# Patient Record
Sex: Female | Born: 1980 | Race: White | Hispanic: Yes | Smoking: Never smoker
Health system: Southern US, Community
[De-identification: ages and names within clinical notes are randomized; demographics above are authoritative.]

## PROBLEM LIST (undated history)

## (undated) ENCOUNTER — Inpatient Hospital Stay (HOSPITAL_COMMUNITY): Payer: Self-pay

## (undated) DIAGNOSIS — Z3490 Encounter for supervision of normal pregnancy, unspecified, unspecified trimester: Secondary | ICD-10-CM

## (undated) DIAGNOSIS — K819 Cholecystitis, unspecified: Secondary | ICD-10-CM

## (undated) DIAGNOSIS — E669 Obesity, unspecified: Secondary | ICD-10-CM

## (undated) DIAGNOSIS — A749 Chlamydial infection, unspecified: Secondary | ICD-10-CM

## (undated) DIAGNOSIS — Z332 Encounter for elective termination of pregnancy: Secondary | ICD-10-CM

## (undated) HISTORY — DX: Cholecystitis, unspecified: K81.9

## (undated) HISTORY — DX: Obesity, unspecified: E66.9

## (undated) HISTORY — DX: Chlamydial infection, unspecified: A74.9

## (undated) HISTORY — DX: Encounter for elective termination of pregnancy: Z33.2

## (undated) HISTORY — DX: Encounter for supervision of normal pregnancy, unspecified, unspecified trimester: Z34.90

---
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
## 2002-02-13 DIAGNOSIS — A749 Chlamydial infection, unspecified: Secondary | ICD-10-CM

## 2002-02-13 HISTORY — DX: Chlamydial infection, unspecified: A74.9

## 2002-03-07 ENCOUNTER — Inpatient Hospital Stay (HOSPITAL_COMMUNITY): Admission: AD | Admit: 2002-03-07 | Discharge: 2002-03-07 | Payer: Self-pay | Admitting: *Deleted

## 2002-03-07 ENCOUNTER — Encounter: Payer: Self-pay | Admitting: *Deleted

## 2002-03-23 ENCOUNTER — Inpatient Hospital Stay (HOSPITAL_COMMUNITY): Admission: AD | Admit: 2002-03-23 | Discharge: 2002-03-25 | Payer: Self-pay | Admitting: *Deleted

## 2002-09-11 ENCOUNTER — Encounter: Admission: RE | Admit: 2002-09-11 | Discharge: 2002-09-11 | Payer: Self-pay | Admitting: Obstetrics and Gynecology

## 2002-09-25 ENCOUNTER — Other Ambulatory Visit: Admission: RE | Admit: 2002-09-25 | Discharge: 2002-09-25 | Payer: Self-pay | Admitting: Obstetrics and Gynecology

## 2002-09-25 ENCOUNTER — Encounter (INDEPENDENT_AMBULATORY_CARE_PROVIDER_SITE_OTHER): Payer: Self-pay | Admitting: *Deleted

## 2002-09-25 ENCOUNTER — Encounter: Admission: RE | Admit: 2002-09-25 | Discharge: 2002-09-25 | Payer: Self-pay | Admitting: Obstetrics and Gynecology

## 2002-10-16 ENCOUNTER — Encounter: Admission: RE | Admit: 2002-10-16 | Discharge: 2002-10-16 | Payer: Self-pay | Admitting: Obstetrics and Gynecology

## 2005-07-18 ENCOUNTER — Ambulatory Visit (HOSPITAL_COMMUNITY): Admission: RE | Admit: 2005-07-18 | Discharge: 2005-07-18 | Payer: Self-pay | Admitting: *Deleted

## 2005-10-03 ENCOUNTER — Ambulatory Visit (HOSPITAL_COMMUNITY): Admission: RE | Admit: 2005-10-03 | Discharge: 2005-10-03 | Payer: Self-pay | Admitting: Obstetrics & Gynecology

## 2005-11-04 ENCOUNTER — Inpatient Hospital Stay (HOSPITAL_COMMUNITY): Admission: AD | Admit: 2005-11-04 | Discharge: 2005-11-04 | Payer: Self-pay | Admitting: Obstetrics & Gynecology

## 2005-11-06 ENCOUNTER — Ambulatory Visit: Payer: Self-pay | Admitting: Obstetrics & Gynecology

## 2005-11-06 ENCOUNTER — Inpatient Hospital Stay (HOSPITAL_COMMUNITY): Admission: AD | Admit: 2005-11-06 | Discharge: 2005-11-06 | Payer: Self-pay | Admitting: Gynecology

## 2006-02-13 DIAGNOSIS — K819 Cholecystitis, unspecified: Secondary | ICD-10-CM

## 2006-02-13 DIAGNOSIS — Z332 Encounter for elective termination of pregnancy: Secondary | ICD-10-CM

## 2006-02-13 HISTORY — DX: Encounter for elective termination of pregnancy: Z33.2

## 2006-02-13 HISTORY — DX: Cholecystitis, unspecified: K81.9

## 2006-02-13 HISTORY — PX: CHOLECYSTECTOMY: SHX55

## 2006-03-01 ENCOUNTER — Ambulatory Visit: Payer: Self-pay | Admitting: *Deleted

## 2006-03-01 ENCOUNTER — Inpatient Hospital Stay (HOSPITAL_COMMUNITY): Admission: AD | Admit: 2006-03-01 | Discharge: 2006-03-01 | Payer: Self-pay | Admitting: Obstetrics & Gynecology

## 2006-03-06 ENCOUNTER — Ambulatory Visit: Payer: Self-pay | Admitting: *Deleted

## 2006-03-06 ENCOUNTER — Inpatient Hospital Stay (HOSPITAL_COMMUNITY): Admission: AD | Admit: 2006-03-06 | Discharge: 2006-03-07 | Payer: Self-pay | Admitting: Family Medicine

## 2006-03-07 ENCOUNTER — Ambulatory Visit: Payer: Self-pay | Admitting: *Deleted

## 2006-03-07 ENCOUNTER — Inpatient Hospital Stay (HOSPITAL_COMMUNITY): Admission: AD | Admit: 2006-03-07 | Discharge: 2006-03-09 | Payer: Self-pay | Admitting: Gynecology

## 2006-04-05 ENCOUNTER — Emergency Department (HOSPITAL_COMMUNITY): Admission: EM | Admit: 2006-04-05 | Discharge: 2006-04-06 | Payer: Self-pay | Admitting: Emergency Medicine

## 2006-04-08 ENCOUNTER — Emergency Department (HOSPITAL_COMMUNITY): Admission: EM | Admit: 2006-04-08 | Discharge: 2006-04-09 | Payer: Self-pay | Admitting: Emergency Medicine

## 2006-04-17 ENCOUNTER — Ambulatory Visit (HOSPITAL_COMMUNITY): Admission: RE | Admit: 2006-04-17 | Discharge: 2006-04-17 | Payer: Self-pay | Admitting: Surgery

## 2006-04-17 ENCOUNTER — Encounter (INDEPENDENT_AMBULATORY_CARE_PROVIDER_SITE_OTHER): Payer: Self-pay | Admitting: *Deleted

## 2007-06-10 ENCOUNTER — Emergency Department (HOSPITAL_COMMUNITY): Admission: EM | Admit: 2007-06-10 | Discharge: 2007-06-10 | Payer: Self-pay | Admitting: Emergency Medicine

## 2007-06-15 ENCOUNTER — Emergency Department (HOSPITAL_COMMUNITY): Admission: EM | Admit: 2007-06-15 | Discharge: 2007-06-15 | Payer: Self-pay | Admitting: Emergency Medicine

## 2007-09-20 ENCOUNTER — Ambulatory Visit: Payer: Self-pay | Admitting: Family Medicine

## 2007-10-02 ENCOUNTER — Ambulatory Visit: Payer: Self-pay | Admitting: Family Medicine

## 2007-10-02 LAB — CONVERTED CEMR LAB
ALT: 24 units/L (ref 0–35)
AST: 19 units/L (ref 0–37)
Albumin: 4.8 g/dL (ref 3.5–5.2)
BUN: 10 mg/dL (ref 6–23)
Basophils Absolute: 0 10*3/uL (ref 0.0–0.1)
Basophils Relative: 0 % (ref 0–1)
CO2: 22 meq/L (ref 19–32)
Calcium: 9.4 mg/dL (ref 8.4–10.5)
Chloride: 106 meq/L (ref 96–112)
Creatinine, Ser: 0.64 mg/dL (ref 0.40–1.20)
Eosinophils Absolute: 0.3 10*3/uL (ref 0.0–0.7)
Eosinophils Relative: 3 % (ref 0–5)
Glucose, Bld: 86 mg/dL (ref 70–99)
Hemoglobin: 13.9 g/dL (ref 12.0–15.0)
LDL Cholesterol: 77 mg/dL (ref 0–99)
Monocytes Absolute: 0.6 10*3/uL (ref 0.1–1.0)
Neutrophils Relative %: 65 % (ref 43–77)
RDW: 13.8 % (ref 11.5–15.5)
Sodium: 139 meq/L (ref 135–145)
Triglycerides: 92 mg/dL (ref <150)
WBC: 8.8 10*3/uL (ref 4.0–10.5)

## 2007-11-27 ENCOUNTER — Ambulatory Visit: Payer: Self-pay | Admitting: Internal Medicine

## 2007-11-27 ENCOUNTER — Encounter: Payer: Self-pay | Admitting: Family Medicine

## 2007-11-27 ENCOUNTER — Encounter: Payer: Self-pay | Admitting: Internal Medicine

## 2007-11-27 LAB — CONVERTED CEMR LAB: Chlamydia, DNA Probe: NEGATIVE

## 2007-11-28 ENCOUNTER — Emergency Department (HOSPITAL_COMMUNITY): Admission: EM | Admit: 2007-11-28 | Discharge: 2007-11-28 | Payer: Self-pay | Admitting: Emergency Medicine

## 2008-03-03 IMAGING — US US OB COMP LESS 14 WK
1 series · 14 of 24 positions shown · non-contrast
Comparison: none

CLINICAL DATA: Positive pregnancy test.
 OBSTETRICAL ULTRASOUND <14 WKS AND TRANSVAGINAL OB US:
TECHNIQUE: Both transabdominal and transvaginal ultrasound examinations were performed for complete evaluation of the gestation as well as the maternal uterus, adnexal regions, and pelvic cul-de-sac.

[Series 1: us ob comp less 14 wk · 0.35mm/px · 14 of 24 slices shown]
[im 1/24]
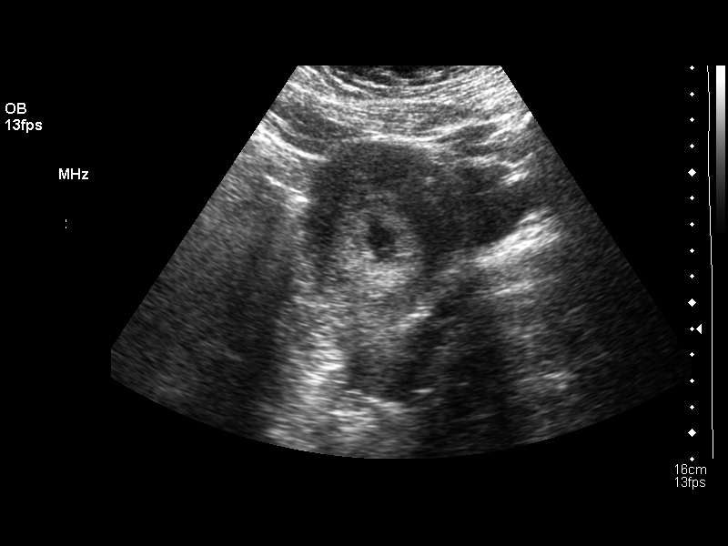
[im 3/24]
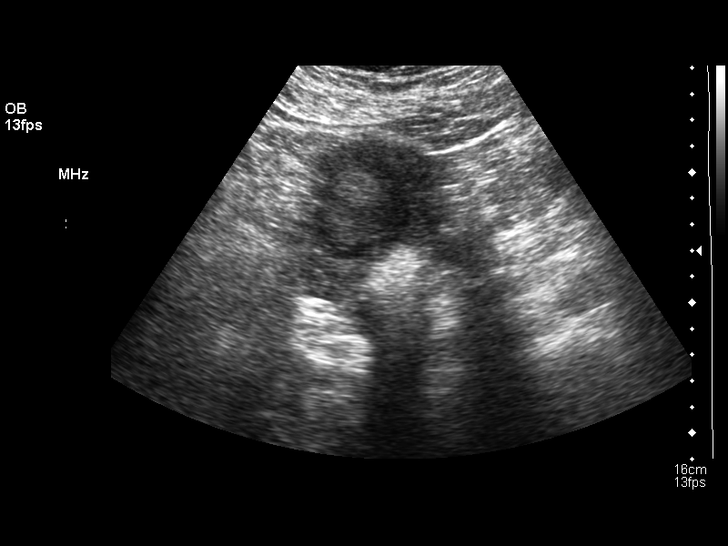
[im 5/24]
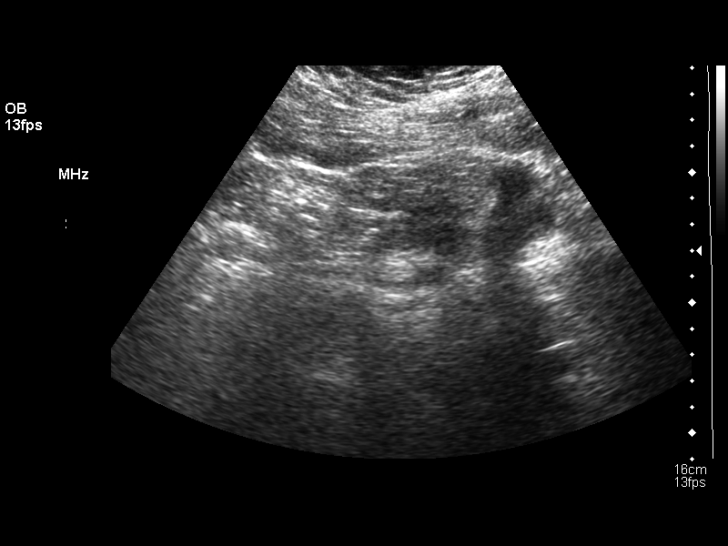
[im 7/24]
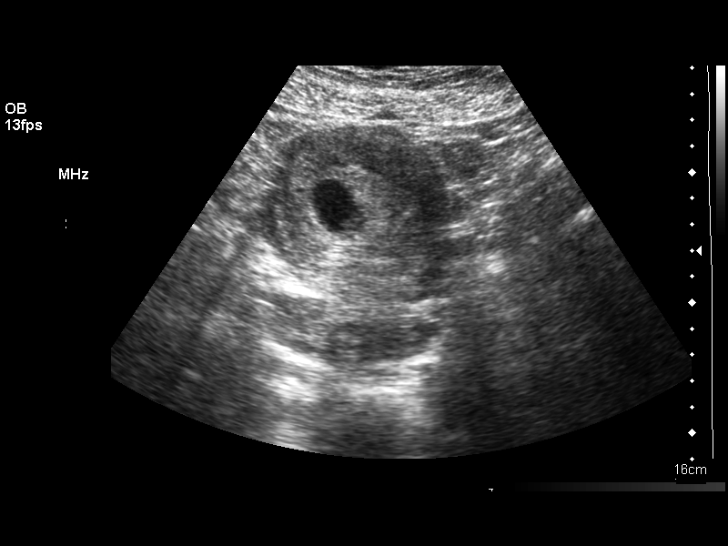
[im 8/24]
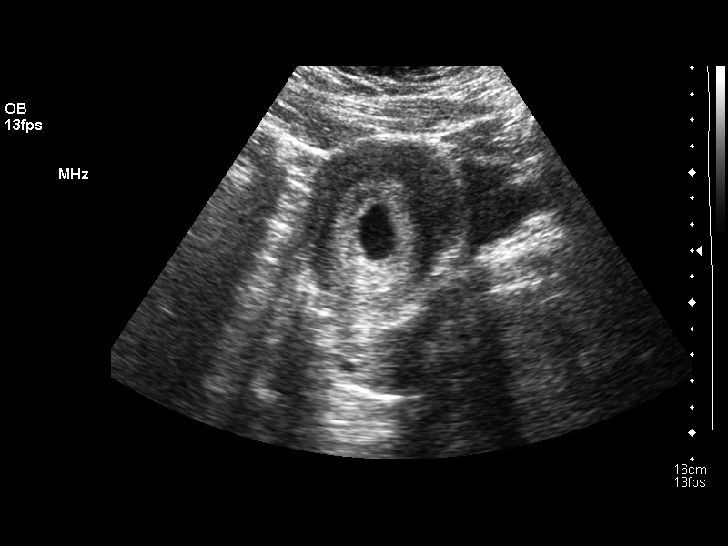
[im 10/24]
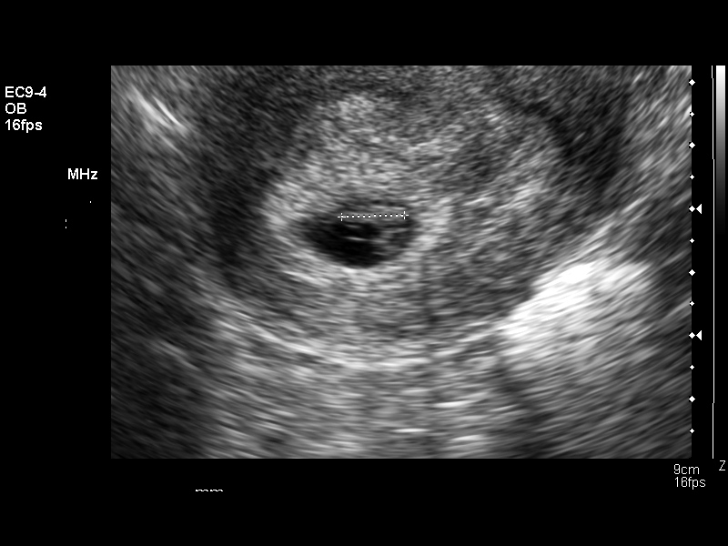
[im 12/24]
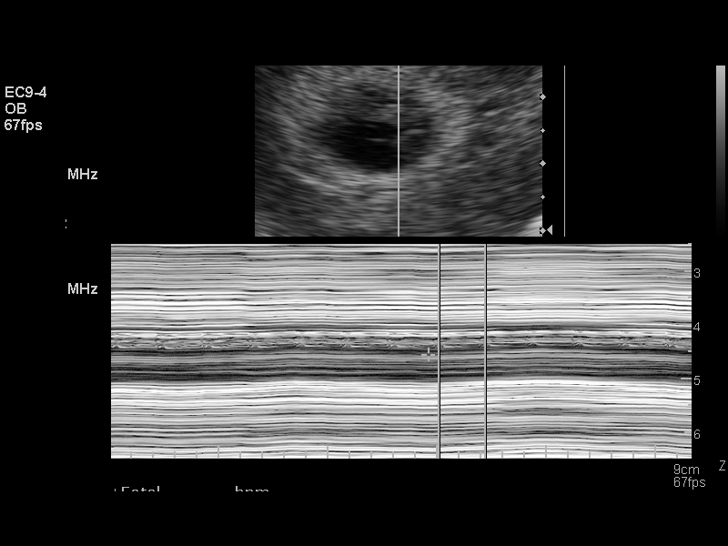
[im 13/24]
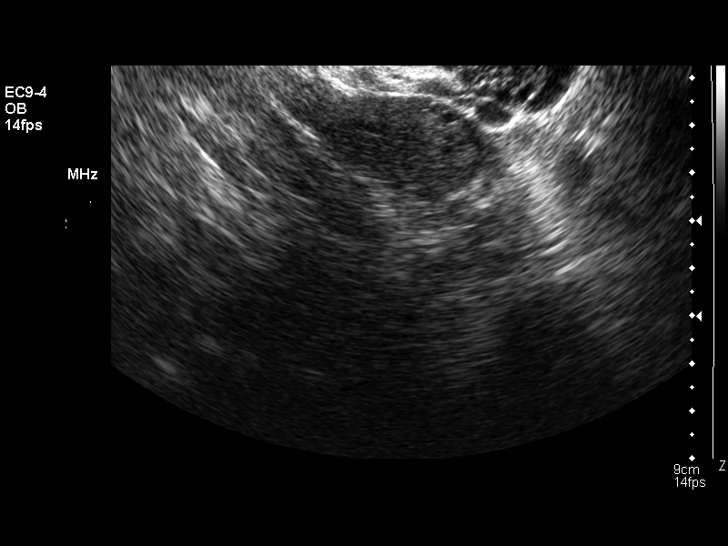
[im 15/24]
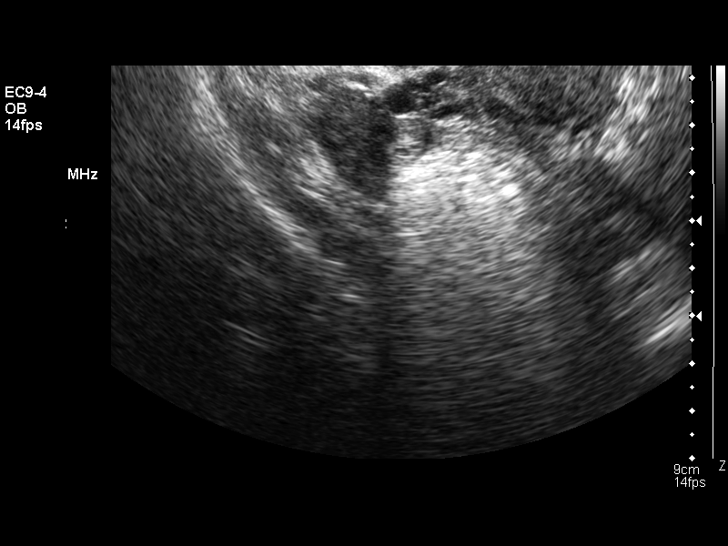
[im 17/24]
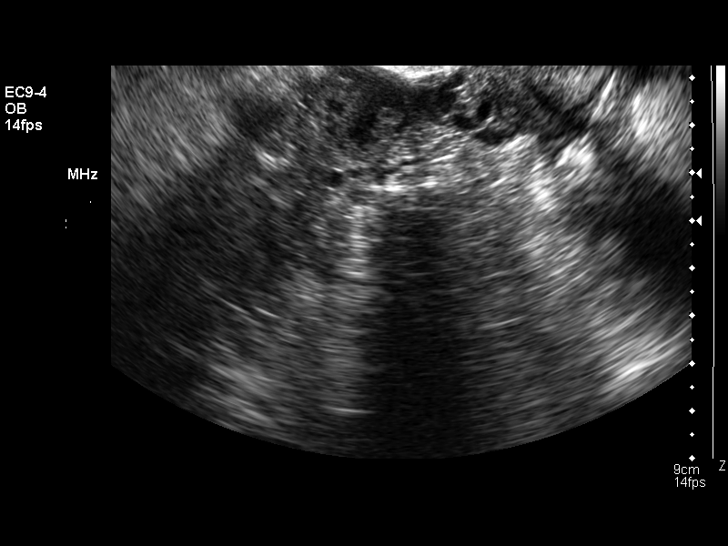
[im 19/24]
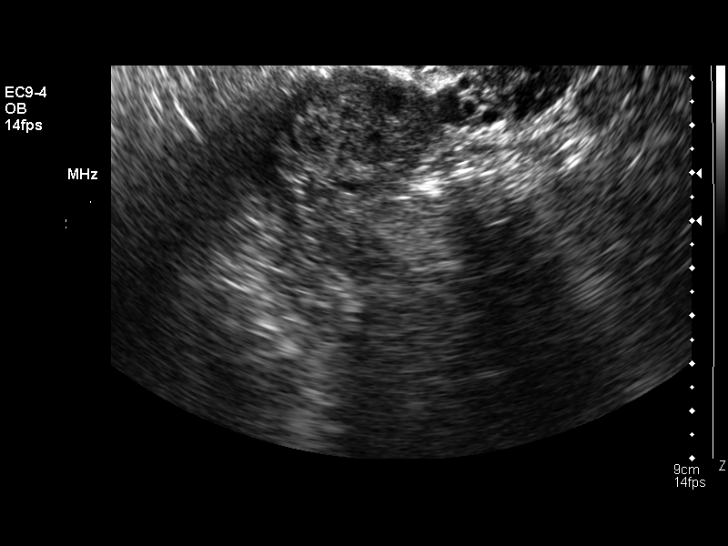
[im 20/24]
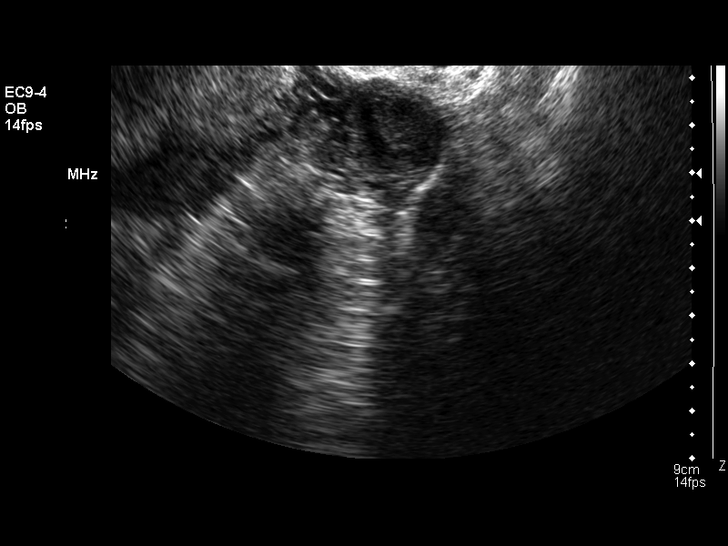
[im 22/24]
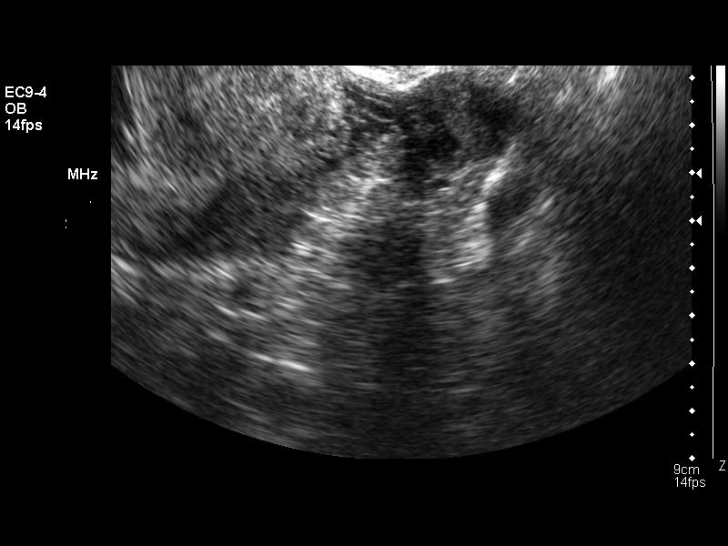
[im 24/24]
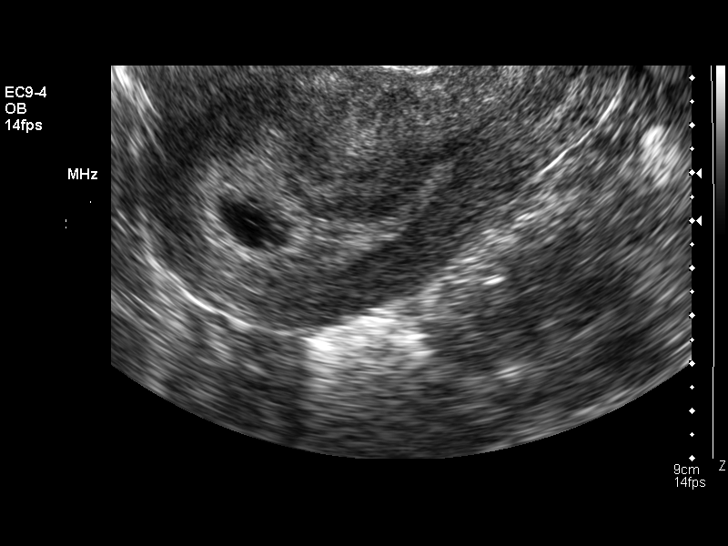

[14 of 24 positions shown; findings below may reference images not displayed]

FINDINGS: An intrauterine gestational sac is identified containing both a yolk sac and embryo.  There is discernible cardiac activity within the embryo at 141 bpm.  The crown rump length is 10 mm which estimates a 7 week 0 day gestational age and an ultrasound EDC of 03/06/06.
 Ovaries are unremarkable.  No free fluid is apparent in the cul-de-sac.
IMPRESSION: Single living intrauterine gestation at 7 week 0 day gestational age.

## 2009-03-30 ENCOUNTER — Emergency Department (HOSPITAL_COMMUNITY): Admission: EM | Admit: 2009-03-30 | Discharge: 2009-03-30 | Payer: Self-pay | Admitting: Emergency Medicine

## 2009-04-11 ENCOUNTER — Emergency Department (HOSPITAL_COMMUNITY): Admission: EM | Admit: 2009-04-11 | Discharge: 2009-04-11 | Payer: Self-pay | Admitting: Family Medicine

## 2009-10-03 ENCOUNTER — Emergency Department (HOSPITAL_COMMUNITY): Admission: EM | Admit: 2009-10-03 | Discharge: 2009-10-03 | Payer: Self-pay | Admitting: Emergency Medicine

## 2010-05-04 LAB — URINALYSIS, ROUTINE W REFLEX MICROSCOPIC
Bilirubin Urine: NEGATIVE
Hgb urine dipstick: NEGATIVE
pH: 5.5 (ref 5.0–8.0)

## 2010-05-04 LAB — POCT URINALYSIS DIP (DEVICE)
Bilirubin Urine: NEGATIVE
Glucose, UA: NEGATIVE mg/dL
Nitrite: NEGATIVE
Protein, ur: NEGATIVE mg/dL
Specific Gravity, Urine: 1.015 (ref 1.005–1.030)
Urobilinogen, UA: 0.2 mg/dL (ref 0.0–1.0)

## 2010-05-04 LAB — RAPID STREP SCREEN (MED CTR MEBANE ONLY): Streptococcus, Group A Screen (Direct): NEGATIVE

## 2010-07-01 NOTE — Op Note (Signed)
Bonnie, Mcneil              ACCOUNT NO.:  [id number]   MEDICAL RECORD NO.:  [id number]          PATIENT TYPE:  AMB   LOCATION:  SDS                          FACILITY:  [facility]   PHYSICIAN:  Ardeth Sportsman, MD     DATE OF BIRTH:  06/19/1980   DATE OF PROCEDURE:  04/17/2006  DATE OF DISCHARGE:                               OPERATIVE REPORT   SURGEON:  Ardeth Sportsman, MD.   ASSISTANTS:  None.   PREOPERATIVE DIAGNOSIS:  Symptomatic cholecystolithiasis.   POSTOPERATIVE DIAGNOSES:  1. Symptomatic cholecystolithiasis.  2. Probable chronic cholecystitis.   PROCEDURE PERFORMED:  Laparoscopic cholecystectomy with intraoperative  cholangiogram.   ANESTHESIA:  1. General anesthesia.  2. Local anesthetic in a field block around all port sites.   SPECIMENS:  Gallbladder.   DRAINS:  None.   ESTIMATED BLOOD LOSS:  Less than 5 mL.   COMPLICATIONS:  None apparent.   INDICATIONS:  Miss Rampersaud is a 30 year old female, who has a classic  story for biliary colic and known gallstones.  The anatomy and  physiology of hepatobiliary and pancreatic function were discussed.  The  pathophysiology of cholecystolithiasis with risks of gallstone  pancreatitis, choledocholithiasis, acute cholecystitis, etc., were  discussed.  Options were discussed and recommendation was made for a  laparoscopic cholecystectomy with intraoperative cholangiogram.   The risks, such as stroke, heart attack, deep vein thrombosis, pulmonary  embolism and death were discussed.  Risks such as bleeding, need for  transfusion, wound infection, abscess, injury to other organs, bile  ducts with need of operative reconstruction or endoscopic stenting or  drainage were discussed.  Risks of incisional hernia, prolonged pain and  other risks were discussed.  Questions were answered and she agreed to  proceed.   OPERATIVE FINDINGS:  She had some moderate omental adhesions to her  gallbladder, and some mild gallbladder wall  thickening consistent with  probable chronic cholecystitis.  Her cholangiogram was normal.   DESCRIPTION OF PROCEDURE:  Informed consent was confirmed.  The patient  voided just prior to going to the operating room.  She had sequential  compression devices active during the entire case.  She underwent  general anesthesia without difficulty.  She was positioned supine with  both arms tucked.  Her abdomen was prepped and draped in sterile  fashion.   Entry was initially attempted through optical entry through a right  upper quadrant stab incision using optical entry with the patient in  reverse Trendelenburg and right side up.  However, the port would not  advance properly, and I could not get it completely through the  abdominal wall without having a fair amount of pressure on the abdominal  wall. Therefore, I stopped this and went ahead and got entry through a  Veress technique and through an infraumbilical vertical incision.  A  towel clamp was used to help grab the umbilical stalk for fascial  countertraction, and the Veress needle passed easily into the abdomen.  Capnoperitoneum to 15 mmHg provided good abdominal insufflation.  A 5-mm  port was placed.  Under direct visualization, a 5-mm port was placed  in  the right upper quadrant, as well as in the right flank.  A 10-mm port  was placed in the subxiphoid region.  Camera inspection revealed that  there was a little small ecchymosis on the greater omentum just cephalad  to the transverse colon.  Careful inspection noted no injury to the  transverse colon or any other organs.  There was no bowel injury or  bleeding.   The gallbladder fundus was grasped and elevated cephalad.  Omental  attachments were freed off carefully.  There were some  cholecystoduodenal attachments that were gently freed off as well.  The  peritoneal covering between the gallbladder and the liver were freed off  on the anteromedial and positional aspects.   Circumferential dissection  was done to reveal a good critical view.  There was a small pulsatile  structure going from the gallbladder down into the porta hepatis  consistent with the cystic artery.  One clip on the gallbladder side and  2 clips on the proximal were made and this was transected.  This would  leave 1 last structure going from the gallbladder infundibulum down to  the porta hepatis consistent with the cystic duct.  A clip was made on  the gallbladder infundibulum.  The cystic duct was transected partially  as close to the gallbladder as possible.   A 5-French cholangiogram catheter was placed through a stab incision in  the right subcostal region, flushed and passed into the cystic duct.  A  gentle clip was placed around the opening to help avoid a leak.  A  cholangiogram was run using active fluoroscopy, using diluted radiopaque  contrast.  Contrast flowed well off a side helical branch consistent  with the cystic duct cannulation.  Contrast flowed well into the common  bile duct, up the common hepatic duct, and in right and left  intrahepatic chains.  There was no evidence of any leak.  Contrast  flowed easily down the distal common bile duct into the duodenum.  This  was consistent with a normal cholangiogram.  The clip was removed and  the catheter was removed.   Four clips were made slightly proximal to the partial cystic ductotomy  and the cystic duct transection was completed.  The gallbladder was  freed from its remaining attachments on the liver bed and removed at the  subxiphoid port with minimal dilation.  The fascial defect in the  subxiphoid region was too small to allow my pinky to pass, and it was  also buried within the falciform ligament; so, I felt it did not require  any more aggressive closure.   Copious irrigation was performed with nice clear return.  There was excellent hemostasis.  Clips were intact on the cystic arterial and duct  stumps with  no evidence of any leak of blood or bile.  The upper 3  abdominal ports were removed and inspection revealed no bleeding in the  peritoneum.  Capnoperitoneum was evacuated.  The umbilical  port was removed.  The skin was closed using a 4-0 Monocryl stitch.  Sterile dressings were applied.  The patient was extubated and sent to  the recovery room in stable condition.   I am about to explain the operative findings to the patient's family.      Ardeth Sportsman, MD  Electronically Signed     SCG/MEDQ  D:  04/17/2006  T:  04/17/2006  Job:  [id number]   cc:   Osvaldo Human, M.D.

## 2011-06-04 ENCOUNTER — Ambulatory Visit: Payer: Self-pay | Admitting: Family Medicine

## 2011-06-04 VITALS — BP 109/71 | HR 70 | Temp 98.5°F | Resp 16 | Ht 66.5 in | Wt 189.0 lb

## 2011-06-04 DIAGNOSIS — K5289 Other specified noninfective gastroenteritis and colitis: Secondary | ICD-10-CM

## 2011-06-04 DIAGNOSIS — Z3201 Encounter for pregnancy test, result positive: Secondary | ICD-10-CM

## 2011-06-04 DIAGNOSIS — N912 Amenorrhea, unspecified: Secondary | ICD-10-CM

## 2011-06-04 DIAGNOSIS — K529 Noninfective gastroenteritis and colitis, unspecified: Secondary | ICD-10-CM

## 2011-06-04 LAB — POCT URINE PREGNANCY: Preg Test, Ur: POSITIVE

## 2011-06-04 MED ORDER — ONDANSETRON 4 MG PO TBDP: 4.0000 mg | ORAL_TABLET | Freq: Three times a day (TID) | ORAL | Status: DC | PRN

## 2011-06-04 NOTE — Progress Notes (Signed)
Subjective: 31 year old Hispanic female who is here with a history of having had diarrhea for about 3 days. He calmed down initially, then got worse again yesterday. Today she's had vomiting and diarrhea. She has vomited 3 times this morning. She has kept some liquids down, enough that she is still urinating. Her last menstrual period in his about 33 days ago, making her just a little overdue.  Nobody else at home is ill. 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  Objective: Doesn't Hispanic female no acute distress. Abdomen had normal bowel sounds. Nontender to percussion or palpitation. No CVA tenderness.  Assessment: Gastroenteritis Amenorrhea (late period)  Plan: Check urine hCG Zofran ODT prescription  Results for orders placed in visit on 06/04/11  POCT URINE PREGNANCY      Component Value Range   Preg Test, Ur Positive      Told her she was pregnant. Advised aching a prenatal vitamin.

## 2011-06-04 NOTE — Patient Instructions (Addendum)
Viral Gastroenteritis Viral gastroenteritis is also known as stomach flu. This condition affects the stomach and intestinal tract. It can cause sudden diarrhea and vomiting. The illness typically lasts 3 to 8 days. Most people develop an immune response that eventually gets rid of the virus. While this natural response develops, the virus can make you quite ill. CAUSES  Many different viruses can cause gastroenteritis, such as rotavirus or noroviruses. You can catch one of these viruses by consuming contaminated food or water. You may also catch a virus by sharing utensils or other personal items with an infected person or by touching a contaminated surface. SYMPTOMS  The most common symptoms are diarrhea and vomiting. These problems can cause a severe loss of body fluids (dehydration) and a body salt (electrolyte) imbalance. Other symptoms may include:  Fever.   Headache.   Fatigue.   Abdominal pain.  DIAGNOSIS  Your caregiver can usually diagnose viral gastroenteritis based on your symptoms and a physical exam. A stool sample may also be taken to test for the presence of viruses or other infections. TREATMENT  This illness typically goes away on its own. Treatments are aimed at rehydration. The most serious cases of viral gastroenteritis involve vomiting so severely that you are not able to keep fluids down. In these cases, fluids must be given through an intravenous line (IV). HOME CARE INSTRUCTIONS   Drink enough fluids to keep your urine clear or pale yellow. Drink small amounts of fluids frequently and increase the amounts as tolerated.   Ask your caregiver for specific rehydration instructions.   Avoid:   Foods high in sugar.   Alcohol.   Carbonated drinks.   Tobacco.   Juice.   Caffeine drinks.   Extremely hot or cold fluids.   Fatty, greasy foods.   Too much intake of anything at one time.   Dairy products until 24 to 48 hours after diarrhea stops.   You may  consume probiotics. Probiotics are active cultures of beneficial bacteria. They may lessen the amount and number of diarrheal stools in adults. Probiotics can be found in yogurt with active cultures and in supplements.   Wash your hands well to avoid spreading the virus.   Only take over-the-counter or prescription medicines for pain, discomfort, or fever as directed by your caregiver. Do not give aspirin to children. Antidiarrheal medicines are not recommended.   Ask your caregiver if you should continue to take your regular prescribed and over-the-counter medicines.   Keep all follow-up appointments as directed by your caregiver.  SEEK IMMEDIATE MEDICAL CARE IF:   You are unable to keep fluids down.   You do not urinate at least once every 6 to 8 hours.   You develop shortness of breath.   You notice blood in your stool or vomit. This may look like coffee grounds.   You have abdominal pain that increases or is concentrated in one small area (localized).   You have persistent vomiting or diarrhea.   You have a fever.   The patient is a child younger than 3 months, and he or she has a fever.   The patient is a child older than 3 months, and he or she has a fever and persistent symptoms.   The patient is a child older than 3 months, and he or she has a fever and symptoms suddenly get worse.   The patient is a baby, and he or she has no tears when crying.  MAKE SURE YOU:     Understand these instructions.   Will watch your condition.   Will get help right away if you are not doing well or get worse.  Document Released: 01/30/2005 Document Revised: 01/19/2011 Document Reviewed: 11/16/2010 [facility] Patient Information 2012 [place], [place].  Consider visiting the [facility] on [address]  Prenatal vitamin

## 2011-06-08 ENCOUNTER — Emergency Department (HOSPITAL_COMMUNITY): Payer: Self-pay

## 2011-06-08 ENCOUNTER — Emergency Department (HOSPITAL_COMMUNITY)
Admission: EM | Admit: 2011-06-08 | Discharge: 2011-06-09 | Disposition: A | Discharge status: Home / Self Care | Payer: Self-pay | Attending: Emergency Medicine | Admitting: Emergency Medicine

## 2011-06-08 ENCOUNTER — Encounter (HOSPITAL_COMMUNITY): Payer: Self-pay | Admitting: Emergency Medicine

## 2011-06-08 DIAGNOSIS — O26899 Other specified pregnancy related conditions, unspecified trimester: Secondary | ICD-10-CM

## 2011-06-08 DIAGNOSIS — O2690 Pregnancy related conditions, unspecified, unspecified trimester: Secondary | ICD-10-CM | POA: Insufficient documentation

## 2011-06-08 DIAGNOSIS — R109 Unspecified abdominal pain: Secondary | ICD-10-CM | POA: Insufficient documentation

## 2011-06-08 DIAGNOSIS — N9489 Other specified conditions associated with female genital organs and menstrual cycle: Secondary | ICD-10-CM | POA: Insufficient documentation

## 2011-06-08 LAB — DIFFERENTIAL
Basophils Absolute: 0 10*3/uL (ref 0.0–0.1)
Eosinophils Absolute: 0.4 10*3/uL (ref 0.0–0.7)
Monocytes Relative: 7 % (ref 3–12)
Neutro Abs: 8.9 10*3/uL — ABNORMAL HIGH (ref 1.7–7.7)
Neutrophils Relative %: 70 % (ref 43–77)

## 2011-06-08 LAB — POCT I-STAT, CHEM 8
BUN: 13 mg/dL (ref 6–23)
Calcium, Ion: 1.18 mmol/L (ref 1.12–1.32)
Chloride: 105 mEq/L (ref 96–112)
Creatinine, Ser: 0.5 mg/dL (ref 0.50–1.10)
HCT: 39 % (ref 36.0–46.0)
Potassium: 3.4 mEq/L — ABNORMAL LOW (ref 3.5–5.1)
Sodium: 140 mEq/L (ref 135–145)
TCO2: 24 mmol/L (ref 0–100)

## 2011-06-08 LAB — URINALYSIS, ROUTINE W REFLEX MICROSCOPIC
Bilirubin Urine: NEGATIVE
Hgb urine dipstick: NEGATIVE
Ketones, ur: NEGATIVE mg/dL
Leukocytes, UA: NEGATIVE
Nitrite: NEGATIVE
Specific Gravity, Urine: 1.012 (ref 1.005–1.030)
Urobilinogen, UA: 1 mg/dL (ref 0.0–1.0)

## 2011-06-08 LAB — WET PREP, GENITAL: Clue Cells Wet Prep HPF POC: NONE SEEN

## 2011-06-08 LAB — CBC
Hemoglobin: 13.3 g/dL (ref 12.0–15.0)
MCH: 29.9 pg (ref 26.0–34.0)

## 2011-06-08 NOTE — ED Notes (Signed)
Pt changing into gown

## 2011-06-08 NOTE — ED Notes (Signed)
Pt aware of long wait for [place].  Pt verbalized understanding.

## 2011-06-08 NOTE — ED Provider Notes (Signed)
History     CSN: [id number]  Arrival date & time 06/08/11  1847   First MD Initiated Contact with Patient 06/08/11 1939      Chief Complaint  Patient presents with  . Pelvic Pain    (Consider location/radiation/quality/duration/timing/severity/associated sxs/prior treatment) HPI Comments: Patient here with lower abdominal cramping - she states that she just found out last week that she was pregnant - estimated to be about 6-7 weeks per LMP at her PCP's office - she states that starting today she began with crampy abdominal pain.  She denies vaginal bleeding or discharge - states that with her two prior pregnancies she never had any pain so she became concerned.  She reports that the pain is episodic and feels like menstrual cramps.  She denies nausea, vomiting, fever, chills, dysuria, hematuria, constipation or diarrhea.  Patient is a 31 y.o. female presenting with pelvic pain. The history is provided by the patient. No language interpreter was used.  Pelvic Pain This is a new problem. The current episode started today. The problem occurs intermittently. The problem has been unchanged. Associated symptoms include abdominal pain. Pertinent negatives include no anorexia, arthralgias, change in bowel habit, chest pain, chills, congestion, coughing, diaphoresis, fatigue, fever, headaches, joint swelling, myalgias, nausea, neck pain, numbness, rash, sore throat, swollen glands, urinary symptoms, vertigo, visual change, vomiting or weakness. The symptoms are aggravated by nothing. She has tried nothing for the symptoms. The treatment provided no relief.    No past medical history on file.  Past Surgical History  Procedure Date  . Cholecystectomy     No family history on file.  History  Substance Use Topics  . Smoking status: Never Smoker   . Smokeless tobacco: Not on file  . Alcohol Use: Not on file    OB History    Grav Para Term Preterm Abortions TAB SAB Ect Mult Living   1                Review of Systems  Constitutional: Negative for fever, chills, diaphoresis and fatigue.  HENT: Negative for congestion, sore throat and neck pain.   Respiratory: Negative for cough.   Cardiovascular: Negative for chest pain.  Gastrointestinal: Positive for abdominal pain. Negative for nausea, vomiting, anorexia and change in bowel habit.  Genitourinary: Positive for pelvic pain. Negative for dysuria, vaginal bleeding and vaginal discharge.  Musculoskeletal: Negative for myalgias, joint swelling and arthralgias.  Skin: Negative for rash.  Neurological: Negative for vertigo, weakness, numbness and headaches.  All other systems reviewed and are negative.    Allergies  Review of patient's allergies indicates no known allergies.  Home Medications   Current Outpatient Rx  Name Route Sig Dispense Refill  . PRENATAL PO Oral Take 1 tablet by mouth daily.      BP 103/49  Pulse 90  Temp(Src) 98 F (36.7 C) (Oral)  SpO2 97%  LMP 04/30/2011  Physical Exam  Nursing note and vitals reviewed. Constitutional: She appears well-developed and well-nourished. No distress.  HENT:  Head: Normocephalic and atraumatic.  Right Ear: External ear normal.  Left Ear: External ear normal.  Nose: Nose normal.  Mouth/Throat: Oropharynx is clear and moist. No oropharyngeal exudate.  Eyes: Conjunctivae are normal. Pupils are equal, round, and reactive to light. No scleral icterus.  Neck: Normal range of motion. Neck supple.  Cardiovascular: Normal rate, regular rhythm and normal heart sounds.  Exam reveals no gallop and no friction rub.   No murmur heard. Pulmonary/Chest: Effort normal and  breath sounds normal. No respiratory distress. She has no wheezes. She has no rales. She exhibits no tenderness.  Abdominal: Soft. Bowel sounds are normal. She exhibits no distension and no mass. There is tenderness. There is no rebound and no guarding.    Lymphadenopathy:    She has no cervical  adenopathy.    ED Course  Procedures (including critical care time)   Labs Reviewed  CBC  DIFFERENTIAL  HCG, QUANTITATIVE, PREGNANCY  GC/CHLAMYDIA PROBE AMP, GENITAL  WET PREP, GENITAL   No results found. Results for orders placed during the hospital encounter of 06/08/11  CBC      Component Value Range   WBC 12.8 (*) 4.0 - 10.5 (K/uL)   RBC 4.45  3.87 - 5.11 (MIL/uL)   Hemoglobin 13.3  12.0 - 15.0 (g/dL)   HCT 40.9  81.1 - 91.4 (%)   MCV 85.6  78.0 - 100.0 (fL)   MCH 29.9  26.0 - 34.0 (pg)   MCHC 34.9  30.0 - 36.0 (g/dL)   RDW 78.2  95.6 - 21.3 (%)   Platelets 284  150 - 400 (K/uL)  DIFFERENTIAL      Component Value Range   Neutrophils Relative 70  43 - 77 (%)   Neutro Abs 8.9 (*) 1.7 - 7.7 (K/uL)   Lymphocytes Relative 20  12 - 46 (%)   Lymphs Abs 2.6  0.7 - 4.0 (K/uL)   Monocytes Relative 7  3 - 12 (%)   Monocytes Absolute 0.9  0.1 - 1.0 (K/uL)   Eosinophils Relative 3  0 - 5 (%)   Eosinophils Absolute 0.4  0.0 - 0.7 (K/uL)   Basophils Relative 0  0 - 1 (%)   Basophils Absolute 0.0  0.0 - 0.1 (K/uL)  HCG, QUANTITATIVE, PREGNANCY      Component Value Range   hCG, Beta Chain, Quant, S 298 (*) <5 (mIU/mL)  WET PREP, GENITAL      Component Value Range   Yeast Wet Prep HPF POC NONE SEEN  NONE SEEN    Trich, Wet Prep NONE SEEN  NONE SEEN    Clue Cells Wet Prep HPF POC NONE SEEN  NONE SEEN    WBC, Wet Prep HPF POC NONE SEEN  NONE SEEN   URINALYSIS, ROUTINE W REFLEX MICROSCOPIC      Component Value Range   Color, Urine YELLOW  YELLOW    APPearance CLEAR  CLEAR    Specific Gravity, Urine 1.012  1.005 - 1.030    pH 7.0  5.0 - 8.0    Glucose, UA NEGATIVE  NEGATIVE (mg/dL)   Hgb urine dipstick NEGATIVE  NEGATIVE    Bilirubin Urine NEGATIVE  NEGATIVE    Ketones, ur NEGATIVE  NEGATIVE (mg/dL)   Protein, ur NEGATIVE  NEGATIVE (mg/dL)   Urobilinogen, UA 1.0  0.0 - 1.0 (mg/dL)   Nitrite NEGATIVE  NEGATIVE    Leukocytes, UA NEGATIVE  NEGATIVE   POCT I-STAT, CHEM 8       Component Value Range   Sodium 140  135 - 145 (mEq/L)   Potassium 3.4 (*) 3.5 - 5.1 (mEq/L)   Chloride 105  96 - 112 (mEq/L)   BUN 13  6 - 23 (mg/dL)   Creatinine, Ser 0.86  0.50 - 1.10 (mg/dL)   Glucose, Bld 83  70 - 99 (mg/dL)   Calcium, Ion 5.78  4.69 - 1.32 (mmol/L)   TCO2 24  0 - 100 (mmol/L)   Hemoglobin 13.3  12.0 -  15.0 (g/dL)   HCT 16.1  09.6 - 04.5 (%)   US Ob Comp Less 14 Wks  06/09/2011  *RADIOLOGY REPORT*  Clinical Data: Pelvic pain, positive pregnancy test  OBSTETRIC <14 WK [place] AND TRANSVAGINAL OB US  Technique:  Both transabdominal and transvaginal ultrasound examinations were performed for complete evaluation of the gestation as well as the maternal uterus, adnexal regions, and pelvic cul-de-sac.  Transvaginal technique was performed to assess early pregnancy.  Comparison:  None.  Intrauterine gestational sac:  Not visualized Yolk sac: Not visualized Embryo: Not visualized Cardiac Activity: Not visualized  Maternal uterus/adnexae: 1.2 x 0.8 x 0.7 cm simple appearing right para ovarian cyst incidentally noted.  The ovaries are normal.  Small free fluid incidentally noted.  IMPRESSION: No intrauterine gestational sac, yolk sac, fetal pole, or cardiac activity visualized. Differential considerations include intrauterine gestation too early to be sonographically visualized, spontaneous abortion, or ectopic pregnancy.  Consider follow-up ultrasound in 14 days and serial quantitative beta HCG follow-up.  Original Report Authenticated By: Harrel Lemon, M.D.   US Ob Transvaginal  06/09/2011  *RADIOLOGY REPORT*  Clinical Data: Pelvic pain, positive pregnancy test  OBSTETRIC <14 WK [place] AND TRANSVAGINAL OB US  Technique:  Both transabdominal and transvaginal ultrasound examinations were performed for complete evaluation of the gestation as well as the maternal uterus, adnexal regions, and pelvic cul-de-sac.  Transvaginal technique was performed to assess early pregnancy.  Comparison:  None.   Intrauterine gestational sac:  Not visualized Yolk sac: Not visualized Embryo: Not visualized Cardiac Activity: Not visualized  Maternal uterus/adnexae: 1.2 x 0.8 x 0.7 cm simple appearing right para ovarian cyst incidentally noted.  The ovaries are normal.  Small free fluid incidentally noted.  IMPRESSION: No intrauterine gestational sac, yolk sac, fetal pole, or cardiac activity visualized. Differential considerations include intrauterine gestation too early to be sonographically visualized, spontaneous abortion, or ectopic pregnancy.  Consider follow-up ultrasound in 14 days and serial quantitative beta HCG follow-up.  Original Report Authenticated By: Harrel Lemon, M.D.      Abdominal pain in early pregnancy    MDM  Patient here with crampy abdominal pain in early pregnancy - she likely has a pregnancy too early to be seen on ultrasound, concerns would continue to be ectopic pregnancy so I have asked the patient to follow up with her OB ASAP which she agrees to do.  She also knows to return here with any worsening of pain, vaginal bleeding, nausea or vomiiting.       Izola Price [place], [place] 06/09/11 [phone]

## 2011-06-08 NOTE — ED Notes (Signed)
Pt has mild pain in lower abd since this morning.  Pt aware of waiting on CT.

## 2011-06-08 NOTE — ED Notes (Signed)
Pt reports she is 5 to [redacted] weeks pregnant confirmed by PCP; has not seen OBGYN yet. Pt now reports 0700 pain in pelvic region that comes and goes. She reports it feels like cramping. No bleeding or spotting noted.

## 2011-06-08 NOTE — ED Notes (Signed)
Pt sitting in stretcher in NAD, respirations even and unlabored. 

## 2011-06-08 NOTE — ED Notes (Signed)
NP to bedside

## 2011-06-09 LAB — GC/CHLAMYDIA PROBE AMP, GENITAL
Chlamydia, DNA Probe: NEGATIVE
GC Probe Amp, Genital: NEGATIVE

## 2011-06-09 NOTE — ED Provider Notes (Signed)
Medical screening examination/treatment/procedure(s) were performed by non-physician practitioner and as supervising physician I was immediately available for consultation/collaboration.   Glynn Octave, MD 06/09/11 1022

## 2011-06-09 NOTE — Discharge Instructions (Signed)
Dolor abdominal en el embarazo  (Abdominal Pain During Pregnancy) Las molestias abdominales son frecuentes Academic librarian. Generalmente no causan ningn dao. Puede tener numerosas causas. Algunas causas son ms graves que otras. Ciertas causas se diagnostican fcilmente. En algunos casos, se demora algn tiempo para comprender el diagnstico. Otras veces la causa no se conoce. El dolor abdominal puede ser un signo de que algo no anda bien en el [place] puede ser debido a una causa totalmente diferente. Por este motivo, siempre comente a su mdico cuando sienta molestias abdominales.  CAUSAS  Las causas ms frecuentes y que no causan ningn dao son:   Constipacin.   Exceso de gases y meteorismo.   Dolor en el ligamento redondo. Este dolor se siente en los pliegues de la ingle.   La posicin en que se encuentra el beb o la placenta.   Las pataditas del beb.   Contracciones de Braxton-Hicks. Estas son contracciones suaves que no producen dilatacin del cuello.  Otras causas graves de dolor abdominal son:   Vanetta Mulders ectpico Se produce cuando un vulo fertilizado se implanta fuera del tero.   Aborto espontneo.   Parto prematuro. El parto prematuro comienza antes de la semana 37 de [place].   Desprendimiento de la placenta. Ocurre cuando la placenta se separa parcial o completamente del tero.   Preeclampsia Generalmente se asocia a hipertensin arterial y tambin se denomina "toxemia del embarazo".   Infecciones del tero o del lquido amnitico.  Las causas que no se relacionan con Firefighter son:   Infeccin del tracto urinario.   Clculos o inflamacin de la vescula.   Hepatitis u otras enfermedades del hgado.   Trastornos intestinales, virus en el estmago, intoxicacin alimentaria, lcera.   Apendicitis.   Clculos en el rin (renales).   Infeccin renal (pielonefritis).  INSTRUCCIONES PARA EL CUIDADO EN EL HOGAR  Si el dolor es leve:   No tenga  relaciones sexuales y no coloque nada dentro de la vagina hasta que los sntomas hayan desaparecido completamente.   Descanse todo lo que pueda hasta que el dolor haya calmado. Si el dolor no mejora en una hora, comunquese con su mdico.   Si siente nuseas, beba lquidos claros. Evite los alimentos slidos hasta que no sienta Dentist en el estmago o desaparezcan las nuseas.   Tome slo la medicacin que le indic el profesional.   Concurra puntualmente a las citas con el mdico.  SOLICITE ATENCIN MDICA DE INMEDIATO SI:   Tiene un sangrado, prdida de lquidos o lo observa al limpiarse la vagina con tis.   El dolor o los clicos [place].   Tiene vmitos persistentes.   Comienza a Financial risk analyst al orinar u [employer].   Tiene fiebre.   Los movimientos del beb disminuyen.   Nota un debilitamiento extremo o se marea.   Tiene dificultad para respirar con o sin dolor abdominal.   Siente un dolor de cabeza intenso junto al dolor abdominal.   Tiene secrecin vaginal con dolor abdominal.   Tiene diarrea persistente.   El dolor abdominal sigue an despus de Field seismologist.  ASEGRESE DE QUE:   Comprende estas instrucciones.   Controlar su enfermedad.   Solicitar ayuda de inmediato si no mejora o si empeora.  Document Released: 01/30/2005 Document Revised: 01/19/2011 [facility] Patient Information 2012 [place], [place].Dolor abdominal en el embarazo  (Abdominal Pain During Pregnancy)  El dolor de vientre (abdominal) es habitual durante el embarazo. Generalmente no se trata de un problema grave.  Otras veces puede ser un signo de que algo no anda bien. Siempre comunquese con su mdico si tiene dolor abdominal. CUIDADOS EN EL HOGAR  Si el dolor es leve:   No tenga sexo (relaciones sexuales) ni se coloque nada dentro de la vagina hasta que se sienta mejor.   Haga reposo hasta que el dolor se calme. Si el dolor dura ms de 1 hora, comunquese con su mdico.   Si siente  ganas de vomitar (nuseas ) beba lquidos claros.   No consuma alimentos slidos hasta que se sienta mejor.   Slo tome los medicamentos que le indic el mdico.   Cumpla con los controles mdicos segn las indicaciones.  SOLICITE AYUDA DE INMEDIATO SI:   Tiene un sangrado, pierde lquido o elimina trozos de tejido por la vagina.   Siente ms dolor o clicos.   Comienza avomitar.   Siente dolor al orinar u observa sangre en la orina.   Tiene fiebre.   No siente mover al beb.   Se siente muy dbil o cree que va a desmayarse.   Tiene dificultad para respirar con o sin dolor en el vientre.   Siente un dolor de cabeza muy intenso y Engineer, mining en el vientre.   Observa que sale un lquido por la vagina y siente dolor abdominal.   La materia fecal es lquida (diarrea).   El dolor en el vientre no desaparece, o empeora, luego de hacer reposo.  ASEGRESE DE QUE:   Comprende estas instrucciones.   Controlar su enfermedad.   Solicitar ayuda de inmediato si no mejora o si empeora.  Document Released: 10/12/2010 Document Revised: 01/19/2011 [facility] Patient Information 2012 [place], [place].

## 2011-06-09 NOTE — ED Notes (Signed)
Late note.  Pt to and from [place].  Pt resting in room in NAD, respirations even and unlabored.

## 2011-07-11 ENCOUNTER — Other Ambulatory Visit: Payer: Self-pay

## 2011-07-11 DIAGNOSIS — Z3480 Encounter for supervision of other normal pregnancy, unspecified trimester: Secondary | ICD-10-CM

## 2011-07-11 LAB — HIV ANTIBODY (ROUTINE TESTING W REFLEX): HIV: NONREACTIVE

## 2011-07-11 NOTE — Progress Notes (Signed)
PRENATAL LABS DONE TODAY Davionne Dowty 

## 2011-07-12 LAB — OBSTETRIC PANEL
Basophils Relative: 0 % (ref 0–1)
Eosinophils Absolute: 0.3 10*3/uL (ref 0.0–0.7)
Hemoglobin: 13.5 g/dL (ref 12.0–15.0)
MCH: 30.1 pg (ref 26.0–34.0)
MCHC: 34.5 g/dL (ref 30.0–36.0)
MCV: 87.1 fL (ref 78.0–100.0)
Monocytes Absolute: 0.7 10*3/uL (ref 0.1–1.0)
Neutrophils Relative %: 73 % (ref 43–77)
Platelets: 261 10*3/uL (ref 150–400)
WBC: 11.1 10*3/uL — ABNORMAL HIGH (ref 4.0–10.5)

## 2011-07-18 ENCOUNTER — Emergency Department (HOSPITAL_COMMUNITY): Payer: Self-pay

## 2011-07-18 ENCOUNTER — Emergency Department (HOSPITAL_COMMUNITY)
Admission: EM | Admit: 2011-07-18 | Discharge: 2011-07-19 | Disposition: A | Discharge status: Home / Self Care | Payer: Self-pay | Attending: Emergency Medicine | Admitting: Emergency Medicine

## 2011-07-18 ENCOUNTER — Encounter: Payer: Self-pay | Admitting: Family Medicine

## 2011-07-18 ENCOUNTER — Ambulatory Visit (INDEPENDENT_AMBULATORY_CARE_PROVIDER_SITE_OTHER): Payer: Self-pay | Admitting: Family Medicine

## 2011-07-18 ENCOUNTER — Encounter (HOSPITAL_COMMUNITY): Payer: Self-pay | Admitting: Emergency Medicine

## 2011-07-18 VITALS — BP 106/70 | Temp 98.0°F | Wt 196.0 lb

## 2011-07-18 DIAGNOSIS — O209 Hemorrhage in early pregnancy, unspecified: Secondary | ICD-10-CM

## 2011-07-18 DIAGNOSIS — B9689 Other specified bacterial agents as the cause of diseases classified elsewhere: Secondary | ICD-10-CM | POA: Insufficient documentation

## 2011-07-18 DIAGNOSIS — O208 Other hemorrhage in early pregnancy: Secondary | ICD-10-CM | POA: Insufficient documentation

## 2011-07-18 DIAGNOSIS — O2390 Unspecified genitourinary tract infection in pregnancy, unspecified trimester: Secondary | ICD-10-CM | POA: Insufficient documentation

## 2011-07-18 DIAGNOSIS — O98819 Other maternal infectious and parasitic diseases complicating pregnancy, unspecified trimester: Secondary | ICD-10-CM | POA: Insufficient documentation

## 2011-07-18 DIAGNOSIS — A599 Trichomoniasis, unspecified: Secondary | ICD-10-CM | POA: Insufficient documentation

## 2011-07-18 DIAGNOSIS — Z3480 Encounter for supervision of other normal pregnancy, unspecified trimester: Secondary | ICD-10-CM

## 2011-07-18 DIAGNOSIS — Z3490 Encounter for supervision of normal pregnancy, unspecified, unspecified trimester: Secondary | ICD-10-CM | POA: Insufficient documentation

## 2011-07-18 DIAGNOSIS — N760 Acute vaginitis: Secondary | ICD-10-CM | POA: Insufficient documentation

## 2011-07-18 DIAGNOSIS — A499 Bacterial infection, unspecified: Secondary | ICD-10-CM | POA: Insufficient documentation

## 2011-07-18 DIAGNOSIS — Z3400 Encounter for supervision of normal first pregnancy, unspecified trimester: Secondary | ICD-10-CM

## 2011-07-18 LAB — URINALYSIS, ROUTINE W REFLEX MICROSCOPIC
Bilirubin Urine: NEGATIVE
Glucose, UA: NEGATIVE mg/dL
Ketones, ur: NEGATIVE mg/dL
Protein, ur: NEGATIVE mg/dL
pH: 6 (ref 5.0–8.0)

## 2011-07-18 LAB — CBC
Hemoglobin: 13.3 g/dL (ref 12.0–15.0)
MCH: 30.8 pg (ref 26.0–34.0)
MCV: 85.6 fL (ref 78.0–100.0)
Platelets: 215 10*3/uL (ref 150–400)
RBC: 4.32 MIL/uL (ref 3.87–5.11)

## 2011-07-18 LAB — BASIC METABOLIC PANEL
CO2: 24 mEq/L (ref 19–32)
Calcium: 9.5 mg/dL (ref 8.4–10.5)
Creatinine, Ser: 0.5 mg/dL (ref 0.50–1.10)
Glucose, Bld: 82 mg/dL (ref 70–99)

## 2011-07-18 LAB — URINE MICROSCOPIC-ADD ON

## 2011-07-18 MED ORDER — METRONIDAZOLE 500 MG PO TABS
2000.0000 mg | ORAL_TABLET | Freq: Once | ORAL | Status: AC
  Administered 2011-07-18: 2000 mg via ORAL
  Filled 2011-07-18: qty 4

## 2011-07-18 NOTE — Patient Instructions (Signed)
Dear Mrs. Weinheimer,   Thank you for coming to clinic today. Please read below regarding the issues that we discussed.   Dating of Pregnancy - I apologize for the confusion. We will get a blood test today to make sure that you are still pregnant. If you are, then we will get an ultrasound and perform an early blood sugar screening.   Please follow up in clinic in 2 days . Please call earlier if you have any questions or concerns.   Sincerely,   Dr. Clinton Sawyer

## 2011-07-18 NOTE — Discharge Instructions (Signed)
Vaginosis bacteriana (Bacterial Vaginosis) La vaginosis bacteriana es una infeccin vaginal en la que el equilibrio normal de las bacterias de la vagina se modifica. Este equilibrio normal se ve afectado por un desarrollo excesivo de ciertas bacterias. Hay diferentes tipos de bacteria que causan la vaginosis bacteriana. Es el problema vaginal ms comn en las mujeres de edad frtil. CAUSAS  La causa de este trastorno no se conoce bien. Se produce como consecuencia de un aumento o desequilibrio de las bacterias nocivas.   Algunas actividades o conductas pueden poner en peligro el equilibrio normal de las bacterias en la vagina, y Astronomer. Entre ellas:   Tener un compaero sexual o mltiples compaeros sexuales.   Las duchas vaginales   Usar un dispositivo intrauterino (DIU) como mtodo anticonceptivo.   No se conoce el papel que juega la actividad sexual en el desarrollo de [place] VB. Sin embargo, las mujeres que nunca tuvieron relaciones sexuales raramente se infectan.  El contagio no se produce en asientos de baos, camas, piscinas o por tocar objetos.  SNTOMAS  Flujo vaginal grisceo.   Olor parecido al pescado con la secrecin, en especial despus de Management consultant.   Picazn o irritacin de la vagina y la vulva.   Ardor o dolor al [employer].   Algunas mujeres no presentan ningn sntoma.  DIAGNSTICO El mdico realizar un examen vaginal para diagnosticar una vaginosis bacteriana. El mdico le indicar anlisis de laboratorio y observar las muestras del lquido vaginal en el microscopio. Buscar bacterias y clulas anormales (clulas clave), pH mayor a 4.5 y [place] prueba de aminas positivo, todos ellos asociados al BV.  RIESGOS Y COMPLICACIONES  Enfermedad plvica inflamatoria (EPI).   Infecciones luego de una ciruga ginecolgica.   VIH.   Virus del Herpes  TRATAMIENTO En algunos casos, la infeccin desaparece sin tratamiento. Sin embargo, todas las  mujeres con sntomas de VB deben tratarse para evitar complicaciones, especialmente si se ha planificado una ciruga ginecolgica. Los compaeros varones generalmente no necesitan tratamiento. Sin embargo, puede contagiarse entre parejas femeninas, de modo que el tratamiento se realiza para Dietitian.   La VB puede tratarse con medicamentos que destruyen grmenes (antibiticos). Estos se presentan en pldoras o en cremas vaginales. Tanto mujeres embarazadas como no embarazadas pueden usar ambos, pero se indican en dosis diferentes. Estos antibiticos no daan al beb.   La VB puede recurrir [employer]. Si esto ocurre, se prescribir un segundo tratamiento con antibiticos.   El tratamiento es importante en el caso de las mujeres [place]. Si no se trata, la VB puede causar [employer], especialmente en [employer] que ha tenido un parto prematuro en el pasado. Todas las mujeres embarazadas que tienen sntomas de VB deben ser controladas y tratadas.   En los casos de recurrencia crnica, se prescribe un tratamiento con un gel vaginal dos veces por semana  INSTRUCCIONES PARA EL CUIDADO DOMICILIARIO  Tome los medicamentos que le indic el mdico.   No mantenga relaciones sexuales Librarian, academic.   Comunique a sus compaeros sexuales que sufre una infeccin vaginal. Ellos deben concurrir para un control mdico si tienen problemas como una urticaria leve o picazn.   Practique el sexo seguro. Use preservativos. Tenga un solo compaero sexual.  PREVENCIN Algunos pasos bsicos de prevencin pueden ayudar a reducir el riesgo de desequilibrio de las bacterias vaginales y de sufrir VB.  No mantener relaciones sexuales (abstinencia)   No utilice duchas vaginales.   Utilice todos los [employer]  que le han prescripto para el tratamiento, aunque los sntomas hayan desaparecido.   Comunique a su compaero sexual que sufre una VB. De ese modo podr tratase, si  es necesario, y podr Economist.  SOLICITE ATENCIN MDICA SI:  Los sntomas no mejoran luego de [address]  [address] de [place].   Aumentan la secrecin, el dolor o la fiebre.  ASEGRESE QUE:   Comprende estas instrucciones.   Controlar su enfermedad.   Solicitar ayuda de inmediato si no mejora o empeora.  PARA MS INFORMACIN: Division de STD Prevention (DSTDP), Centers for Disease Control and Prevention (Centros para el control y la prevencin de enfermedades, CDC): SolutionApps.co.za American Social Health Association (ASHA): www.ashastd.org  Document Released: 05/09/2007 Document Revised: 01/19/2011 [facility] Patient Information 2012 [place], [place].           Tricomoniasis (Trichomoniasis) La tricomoniasis es una infeccin causada por la Tricomonas y afecta tanto a hombres como a mujeres. En la mujer, afecta los rganos genitales externos y la vagina. En los hombres, afecta principalmente al pene, pero tambin puede estar involucrada la prstata y otros rganos reproductivos. Es una enfermedad de transmisin sexual y generalmente se transmite de Neomia Dear persona a otra a travs del contacto sexual. Katha Hamming de las personas que contraen tricomoniasis se contagian en una relacin sexual, y tambin tienen riesgo de Primary school teacher otras enfermedades de transmisin sexual. Nurse, children's sexual con un compaero infectado.   Tambin puede encontrarse en las piscinas o yacuzzis.  SNTOMAS  Secrecin espumosa gris verdosa [employer].   Picazn e irritacin vaginal en las mujeres.   Picazn e irritacin en la zona externa de la vagina en las mujeres.   La secrecin del pene puede aparecer con o sin dolor.   Inflamacin de la uretra (uretritis), lo que causa dolor al [employer].   Hemorragias luego de Sales promotion account executive.  COMPLICACIONES  Enfermedad inflamatoria plvica.   Infeccin en el tero (endometritis).   Infertilidad.   Embarazo ectpico.   Puede  asociarse a otras enfermedades de transmisin sexual, incluyendo gonorrea y clamidia, hepatitis B y el VIH.  COMPLICACIONES DURANTE EL EMBARAZO:  Parto prematuro.   Ruptura prematura de las [place].   Bajo peso del nio al nacer.  DIAGNSTICO  Visualizacin de las tricomonas en el microscopio observando la secrecin vaginal.   El Ph de la vagina es mayor de 4,5 al analizarlo con una cinta de prueba.   Trich Rapid Test.   Cultivo del organismo, pero generalmente no es necesario.   Puede hallarse en un [place]apan[place]au.   Tener un "cuello en frutilla" lo que significa que tiene el aspecto rojo, similar a esa fruta.  TRATAMIENTO  Le indicarn medicamentos para controlar la infeccin. Informe a su mdico si est embarazada o sospecha estarlo. Allgunos medicamentos utilizados para tratar la infeccin no deben Haematologist.   Le recomendarn medicamentos o cremas de venta libre para disminuir la picazn o la irritacin.   Su compaero sexual deber recibir tratamiento si est infectado.  INSTRUCCIONES PARA EL CUIDADO DOMICILIARIO:  Tome los Estée Lauder indic el profesional que lo asiste.   Tome medicamentos de venta libre para la picazn o la irritacin, segn las indicaciones de su mdico.   No tenga relaciones sexuales mientras dure la infeccin.   No se haga duchas vaginales ni use tampones.   Comente a su compaero que sufre una infeccin, ya que puede haberse contagiado de usted. O puede ser su pareja la que haya transmitido  la infeccin a usted.   Si es necesario, pdale a su pareja sexual que concurra para un examen y tratamiento.   Practique sexo seguro y protegido.   Consulte con su mdico para [employer] para el diagnstico de otras enfermedades de transmisin sexual.  SOLICITE ATENCIN MDICA SI:  An tiene sntomas despus de finalizados los medicamentos.   Usted tiene una temperatura oral de ms de 102 F (38.9 C).    Presenta dolor en el vientre (abdominal).   Siente dolor al [employer]ill[employer].   Tiene hemorragias durante las [employer].   Aparece una erupcin cutnea.   Los medicamentos la hacen sentir mal o vomita.  Document Released: 11/09/2004 Document Revised: 01/19/2011 [facility] Patient Information 2012 [place], [place].       Hemorragia vaginal durante el embarazo, primer trimestre (Vaginal Bleeding During Pregnancy, First Trimester) Durante el embarazo es relativamente frecuente que se presente una pequea hemorragia (manchas). Esta situacin generalmente mejora por s misma. Existen muchas causas para la hemorragia o prdidas durante el principio del Psychiatrist. Algunas hemorragias pueden estar relacionadas al [employer] y otras no. Si aparecen retortijones con la hemorragia es ms serio y preocupante. Informe al mdico si tiene cualquier tipo de hemorragia vaginal.  CAUSAS  En la mayora de los [place] es normal.   El embarazo finaliza (aborto espontneo).   El Psychiatrist podra terminar (amenaza de aborto espontneo).   Infeccin o inflamacin del tero.   Tumores (plipos) en el tero.   El embarazo sucede por fuera del tero y en las trompas de falopio (embarazo ectpico).   Muchos quistes pequeos en el tero en lugar del tejido del embarazo (embarazo molar).  SNTOMAS Hemorragia o goteo vaginal con o sin retortijones. DIAGNSTICO Para evaluar el embarazo, el mdico podr:  Education officer, environmental un examen plvico.   Tomar anlisis de [place].   Realizar una prueba de [place].  Es importante seguir las indicaciones del profesional que la asiste.  TRATAMIENTO  Evaluacin del embarazo con anlisis de [place] y [place].   Reposo en cama (slo levantarse para ir al bao).   Inmunizacin con Rho-gam si la madre es Rh negativo y el padre es Rh positivo.  INSTRUCCIONES PARA EL CUIDADO DOMICILIARIO  Si el mdico le indica reposo en cama, usted necesitar hacer algunos arreglos para  que otra persona se ocupe del cuidado de los nios y de otras responsabilidades adicionales. Sin embargo, podr autorizarla a Building surveyor.   Lleve un registro de la cantidad y la saturacin de las toallas higinicas que Landscape architect. Escriba esta informacin:   No use tampones. No utilice duchas vaginales.   No tenga relaciones sexuales u orgasmos hasta que el mdico la autorice.   Guarde una muestra de los tejidos para que el profesional lo inspeccione.   Tome medicamentos para el dolor slo con el permiso del mdico.   No tome aspirina, ya que puede causar hemorragias.  SOLICITE ATENCIN MDICA INMEDIATAMENTE SI:  Siente calambres intensos en el estmago, en la espalda o en el vientre (abdomen).   Usted tiene una temperatura oral de ms de 102 F (38.9 C) y no puede controlarla con medicamentos.   Elimina cogulos o tejidos grandes.   La hemorragia aumenta o se siente mareada dbil o tiene episodios de desmayos.   Comienza a sentir escalofros.   Tiene una prdida de lquido por la vagina.   No puede mover el intestino. Podra tratarse de un embarazo ectpico.  Document Released: 11/09/2004 Document Revised: 01/19/2011 [employer]  Patient Information 2012 [employer]. 

## 2011-07-18 NOTE — ED Notes (Signed)
Pt c/o vaginal spotting today; pt sts is pregnant not sure how far along but early with LMP in March; pt G3 P2; pt denies pain or passing clots

## 2011-07-18 NOTE — ED Notes (Signed)
Report received from off going nurse Scott 

## 2011-07-18 NOTE — ED Provider Notes (Signed)
History     CSN: [id number]  Arrival date & time 07/18/11  5784   First MD Initiated Contact with Patient 07/18/11 2027      Chief Complaint  Patient presents with  . Vaginal Bleeding    (Consider location/radiation/quality/duration/timing/severity/associated sxs/prior treatment) The history is provided by the patient.  [id number] F LMP unknown date in early March with +preg late April but no confirmed IUP on [place] presents to ED with c/c of vaginal bleeding onset this afternoon. Small amt bleeding seen on toilet paper when wiping several times and then on underwear, bright red blood. No assoc abd/pelvic pain, other fluid leakage, or cramping. No dysuria or hematuria. No lightheadedness or dizziness. No aggravating or alleviating factors. No prior treatment. First prenatal visit was today, had blood drawn but denies she was seen by nurse or MD and bleeding started after visit. Prior pregnancies without reported complications.  History reviewed. No pertinent past medical history.  Past Surgical History  Procedure Date  . Cholecystectomy     History reviewed. No pertinent family history.  History  Substance Use Topics  . Smoking status: Never Smoker   . Smokeless tobacco: Not on file  . Alcohol Use: Not on file     Review of Systems 10 systems reviewed and are negative for acute change except as noted in the HPI.  Allergies  Review of patient's allergies indicates no known allergies.  Home Medications   Current Outpatient Rx  Name Route Sig Dispense Refill  . PRENATAL PO Oral Take 1 tablet by mouth daily.      BP 109/68  Pulse 78  Temp(Src) 99.1 F (37.3 C) (Oral)  Resp 18  SpO2 98%  LMP 05/04/2011  Physical Exam  Nursing note reviewed. Constitutional: She appears well-developed and well-nourished. No distress.       Vital signs are reviewed and are normal.   HENT:  Head: Normocephalic and atraumatic.       MMM  Eyes: Pupils are equal, round, and reactive to light.   Neck: Neck supple.  Cardiovascular: Normal rate, regular rhythm and normal heart sounds.   Pulmonary/Chest: Effort normal and breath sounds normal. No respiratory distress. She has no wheezes.  Abdominal: Soft. Bowel sounds are normal. She exhibits no distension. There is Tenderness: mild suprapubic.. There is no guarding.  Genitourinary: There is no rash, tenderness or lesion on the right labia. There is no rash, tenderness or lesion on the left labia. Uterus is enlarged and tender. Cervix exhibits friability. Cervix exhibits no motion tenderness and no discharge. Right adnexum displays no mass, no tenderness and no fullness. Left adnexum displays no mass, no tenderness and no fullness. No bleeding around the vagina. Vaginal discharge found.  Musculoskeletal: She exhibits no edema.  Neurological: She is alert.       Normal gait, Speech clear, MAEW  Skin: Skin is warm and dry.    ED Course  Procedures (including critical care time)  Labs Reviewed  URINALYSIS, ROUTINE W REFLEX MICROSCOPIC - Abnormal; Notable for the following:    APPearance HAZY (*)    Leukocytes, UA SMALL (*)    All other components within normal limits  POCT PREGNANCY, URINE - Abnormal; Notable for the following:    Preg Test, Ur POSITIVE (*)    All other components within normal limits  URINE MICROSCOPIC-ADD ON - Abnormal; Notable for the following:    Squamous Epithelial / LPF MANY (*)    Bacteria, UA FEW (*)    All other  components within normal limits  CBC  BASIC METABOLIC PANEL  WET PREP, GENITAL  GC/CHLAMYDIA PROBE AMP, GENITAL   No results found.   1. Vaginal bleeding in pregnancy, first trimester   2. Trichomonas   3. Bacterial vaginosis       MDM  Pt with + preg, spotting today. No active bleeding on exam. Labs reviewed, nml BMP, slight leukocytosis. Trichomonas in urine that otherwise appears to be a contaminated sample. Pt was tx for trichomonas prior to wet prep result, no additional tx will  be given in ED for bacterial vaginosis- advised to f.u with OB for recheck. US demonstrates [redacted]w[redacted]d single living IUP with no subchorionic hemorrhage, normal ovaries. Suspect bleeding today from vaginal infection. Pt advised of all results.        Shaaron Adler, [place] 07/18/11 2355

## 2011-07-19 ENCOUNTER — Encounter: Payer: Self-pay | Admitting: Family Medicine

## 2011-07-19 LAB — GC/CHLAMYDIA PROBE AMP, GENITAL
Chlamydia, DNA Probe: NEGATIVE
GC Probe Amp, Genital: NEGATIVE

## 2011-07-19 NOTE — ED Notes (Signed)
Pt discharge instructions reviewed espically importance of keeping follow up appt on 07-20-11. Pt verbalizes understanding and denies any questions. Pt discharged home with significant other in no apparent distress and in stable condition.

## 2011-07-19 NOTE — ED Provider Notes (Signed)
Medical screening examination/treatment/procedure(s) were performed by non-physician practitioner and as supervising physician I was immediately available for consultation/collaboration.   Deania Siguenza W Eriyana Sweeten, MD 07/19/11 0026 

## 2011-07-19 NOTE — Progress Notes (Signed)
Patient presents for first prenatal visit and believes her LMP was 05/04/11, which would make her about 10w 5d of gestation. However, an ultrasound performed in the ED on 4/26 did not see a gestational sac or fetal pole. Heart sounds were not heard on doppler today. Therefore, we are not certain that the patient is still pregnant. We will obtain a serum beta HCG and reassess in clinic on Thursday.

## 2011-07-20 ENCOUNTER — Ambulatory Visit (INDEPENDENT_AMBULATORY_CARE_PROVIDER_SITE_OTHER): Payer: Self-pay | Admitting: Family Medicine

## 2011-07-20 ENCOUNTER — Encounter: Payer: Self-pay | Admitting: Family Medicine

## 2011-07-20 ENCOUNTER — Other Ambulatory Visit (HOSPITAL_COMMUNITY)
Admission: RE | Admit: 2011-07-20 | Discharge: 2011-07-20 | Disposition: A | Discharge status: Home / Self Care | Payer: Self-pay | Source: Ambulatory Visit | Attending: Family Medicine | Admitting: Family Medicine

## 2011-07-20 VITALS — BP 102/66 | Wt 195.0 lb

## 2011-07-20 DIAGNOSIS — Z01419 Encounter for gynecological examination (general) (routine) without abnormal findings: Secondary | ICD-10-CM | POA: Insufficient documentation

## 2011-07-20 DIAGNOSIS — Z3490 Encounter for supervision of normal pregnancy, unspecified, unspecified trimester: Secondary | ICD-10-CM

## 2011-07-20 DIAGNOSIS — Z3480 Encounter for supervision of other normal pregnancy, unspecified trimester: Secondary | ICD-10-CM

## 2011-07-20 NOTE — Patient Instructions (Signed)
You are at 9 weeks of your pregnancy. Please return in 4 weeks. If you have any more bleeding, then please call the office or go to [facility].   Take Care,   Dr. Clinton Sawyer

## 2011-07-20 NOTE — Progress Notes (Signed)
31 y.o. [id number] @ 9w 0d by early u/s presenting for routine [facility] and hospital follow-up.   Patient went to ED at [facility] on 6/4, several hours after appointment with me, for vaginal bleeding. U/S performed that showed IUP at [redacted]w[redacted]d so our question of whether she was still pregnant was answered. Also noted to have BV and treated with 2g of metronidazole. Currently denies any vaginal bleeding. Otherwise, no problems. Her only missing 1st trimester lab is now a pap smear.  - Pap performed today - Return in 4 weeks - Still undecided about genetic screening - Will need early glucola 2/2 obesity - Is "[employer]" patient, but will apply for Medicaid

## 2011-07-26 ENCOUNTER — Inpatient Hospital Stay (HOSPITAL_COMMUNITY)
Admission: AD | Admit: 2011-07-26 | Discharge: 2011-07-26 | Disposition: A | Discharge status: Home / Self Care | Payer: Self-pay | Source: Ambulatory Visit | Attending: Obstetrics & Gynecology | Admitting: Obstetrics & Gynecology

## 2011-07-26 ENCOUNTER — Encounter (HOSPITAL_COMMUNITY): Payer: Self-pay

## 2011-07-26 ENCOUNTER — Inpatient Hospital Stay (HOSPITAL_COMMUNITY): Payer: Self-pay

## 2011-07-26 ENCOUNTER — Encounter (HOSPITAL_COMMUNITY): Payer: Self-pay | Admitting: *Deleted

## 2011-07-26 DIAGNOSIS — O4690 Antepartum hemorrhage, unspecified, unspecified trimester: Secondary | ICD-10-CM

## 2011-07-26 DIAGNOSIS — O209 Hemorrhage in early pregnancy, unspecified: Secondary | ICD-10-CM | POA: Insufficient documentation

## 2011-07-26 MED ORDER — ACETAMINOPHEN 325 MG PO TABS
650.0000 mg | ORAL_TABLET | Freq: Once | ORAL | Status: AC
  Administered 2011-07-26: 650 mg via ORAL
  Filled 2011-07-26: qty 2

## 2011-07-26 NOTE — Discharge Instructions (Signed)
Your baby sounds very good today.   The bleeding is likely due to having intercourse. No sex if you are having bleeding. Return if you have severe bleeding or severe pain. Call your doctor if you have questions or concerns.

## 2011-07-26 NOTE — Progress Notes (Signed)
Pt had bleeding at home after a nap.pt states she does not know whether she had any clots or not

## 2011-07-26 NOTE — MAU Provider Note (Signed)
History     CSN: [id number]  Arrival date and time: 07/26/11 1819  Provider initiated contact at 1930  Chief Complaint  Patient presents with  . Vaginal Bleeding   HPI Bonnie Mcneil 31 y.o. 9w 5d by ultrasound.  Was here in MAU this morning with postcoital bleeding.  Heard FHT with doppler and client went home to return to prenatal care.  Returns tonight with an episode of heavy bleeding at home.  Blood type is O +.  OB History    Grav Para Term Preterm Abortions TAB SAB Ect Mult Living   4 2 2  0 1 1 0 0 0 2      Past Medical History  Diagnosis Date  . Cholecystitis 2008  . Intrauterine pregnancy 2004, 2008  . Therapeutic abortion in first trimester 2008    secondary to concern for baby during cholecystecomy  . Chlamydia 2004    Past Surgical History  Procedure Date  . Cholecystectomy 2008    Family History  Problem Relation Age of Onset  . Anesthesia problems Neg Hx   . Hypotension Neg Hx   . Malignant hyperthermia Neg Hx   . Pseudochol deficiency Neg Hx     History  Substance Use Topics  . Smoking status: Never Smoker   . Smokeless tobacco: Never Used  . Alcohol Use: No    Allergies: No Known Allergies  Prescriptions prior to admission  Medication Sig Dispense Refill  . Prenatal Vit-Fe Fumarate-FA (PRENATAL MULTIVITAMIN) TABS Take 1 tablet by mouth daily.      Marland Kitchen DISCONTD: Prenatal Vit-Fe Fumarate-FA (PRENATAL PO) Take 1 tablet by mouth daily.        Review of Systems  Gastrointestinal: Negative for nausea, vomiting and abdominal pain.  Genitourinary: Negative for dysuria.       Vaginal bleeding  Neurological: Positive for headaches.   Physical Exam   Blood pressure 116/57, pulse 80, temperature 98 F (36.7 C), temperature source Oral, resp. rate 20, height 5' 5.5" (1.664 m), weight 202 lb (91.627 kg), last menstrual period 05/04/2011.  Physical Exam  Nursing note and vitals reviewed. Constitutional: She is oriented to person, place, and  time. She appears well-developed and well-nourished.  HENT:  Head: Normocephalic.  Eyes: EOM are normal.  Neck: Neck supple.  GI: Soft. There is no tenderness.       Unable to hear FHT with doppler  Genitourinary:       Minimal bleeding on pad  Musculoskeletal: Normal range of motion.  Neurological: She is alert and oriented to person, place, and time.  Skin: Skin is warm and dry.  Psychiatric: She has a normal mood and affect.   .  OBSTETRIC <14 WK ULTRASOUND  Technique: Transabdominal ultrasound was performed for evaluation  of the gestation as well as the maternal uterus and adnexal  regions.  Comparison: OB ultrasound 07/18/2011.  Intrauterine gestational sac: Visualized/normal in shape.  Yolk sac: Not visualized.  Embryo: Visualized.  Cardiac Activity: Detected.  Heart Rate: 147 bpm  CRL: 4.57 cm 11w 3d [place] EDC: 02/11/2012.  Maternal uterus/Adnexae:  Unremarkable.  IMPRESSION:  Single living intrauterine pregnancy without evidence of  complication.  Original Report Authenticated By: Bernadene Bell. D'ALESSIO, M.D.      MAU Course  Procedures Tylenol PO ordered for headache.  Will get ultrasound as bleeding has worsened and unable to hear FHT with doppler tonight.  MDM   Assessment and Plan  Care assumed by Bonnie Sow, NP at 2000  A:  Viable IUP at [redacted]w[redacted]d     P:  Reassured        Encouraged prenatal care      [place] 07/26/2011, 7:43 PM

## 2011-07-26 NOTE — MAU Note (Signed)
Pt states had intercourse last pm, noted red/pink blood when wiping, then saw dark brown blood.

## 2011-07-26 NOTE — Discharge Instructions (Signed)
Hemorragia vaginal durante el embarazo, primer trimestre (Vaginal Bleeding During Pregnancy, First Trimester) Durante el embarazo es relativamente frecuente que se presente una pequea hemorragia (manchas). Esta situacin generalmente mejora por s misma. Existen muchas causas para la hemorragia o prdidas durante el principio del Psychiatrist. Algunas hemorragias pueden estar relacionadas al [employer] y otras no. Si aparecen retortijones con la hemorragia es ms serio y preocupante. Informe al mdico si tiene cualquier tipo de hemorragia vaginal.  CAUSAS  En la mayora de los [place] es normal.   El embarazo finaliza (aborto espontneo).   El Psychiatrist podra terminar (amenaza de aborto espontneo).   Infeccin o inflamacin del tero.   Tumores (plipos) en el tero.   El embarazo sucede por fuera del tero y en las trompas de falopio (embarazo ectpico).   Muchos quistes pequeos en el tero en lugar del tejido del embarazo (embarazo molar).  SNTOMAS Hemorragia o goteo vaginal con o sin retortijones. DIAGNSTICO Para evaluar el embarazo, el mdico podr:  Education officer, environmental un examen plvico.   Tomar anlisis de [place].   Realizar una prueba de [place].  Es importante seguir las indicaciones del profesional que la asiste.  TRATAMIENTO  Evaluacin del embarazo con anlisis de [place].   Reposo en cama (slo levantarse para ir al bao).   Inmunizacin con Rho-gam si la madre es Rh negativo y el padre es Rh positivo.  INSTRUCCIONES PARA EL CUIDADO DOMICILIARIO  Si el mdico le indica reposo en cama, usted necesitar hacer algunos arreglos para que otra persona se ocupe del cuidado de los nios y de otras responsabilidades adicionales. Sin embargo, podr autorizarla a Building surveyor.   Lleve un registro de la cantidad y la saturacin de las toallas higinicas que Landscape architect. Escriba esta informacin:   No use tampones. No utilice duchas vaginales.   No  tenga relaciones sexuales u orgasmos hasta que el mdico la autorice.   Guarde una muestra de los tejidos para que el profesional lo inspeccione.   Tome medicamentos para el dolor slo con el permiso del mdico.   No tome aspirina, ya que puede causar hemorragias.  SOLICITE ATENCIN MDICA INMEDIATAMENTE SI:  Siente calambres intensos en el estmago, en la espalda o en el vientre (abdomen).   Usted tiene una temperatura oral de ms de 102 F (38.9 C) y no puede controlarla con medicamentos.   Elimina cogulos o tejidos grandes.   La hemorragia aumenta o se siente mareada dbil o tiene episodios de desmayos.   Comienza a sentir escalofros.   Tiene una prdida de lquido por la vagina.   No puede mover el intestino. Podra tratarse de un embarazo ectpico.  Document Released: 11/09/2004 Document Revised: 01/19/2011 [facility] Patient Information 2012 [place], [place].C.

## 2011-07-26 NOTE — MAU Provider Note (Signed)
  History     CSN: [id number]  Arrival date and time: 07/26/11 4098   First Provider Initiated Contact with Patient 07/26/11 0804      Chief Complaint  Patient presents with  . Vaginal Bleeding   HPI Bonnie KESECKER 31 y.o. 9w 6d by [place].  Had bleeding when she wiped today.  Had intercourse last night.  Was worried about the pregnancy.  Is getting [facility] at [facility].  Has next appointment scheduled.  Had ultrasound on 07-18-11 and IUP identified with no subchorionic hemorrhage.  OB History    Grav Para Term Preterm Abortions TAB SAB Ect Mult Living   4 2 2  0 1 1 0 0 0 2      Past Medical History  Diagnosis Date  . Cholecystitis 2008  . Intrauterine pregnancy 2004, 2008  . Therapeutic abortion in first trimester 2008    secondary to concern for baby during cholecystecomy  . Chlamydia 2004    Past Surgical History  Procedure Date  . Cholecystectomy 2008    Family History  Problem Relation Age of Onset  . Anesthesia problems Neg Hx   . Hypotension Neg Hx   . Malignant hyperthermia Neg Hx   . Pseudochol deficiency Neg Hx     History  Substance Use Topics  . Smoking status: Never Smoker   . Smokeless tobacco: Never Used  . Alcohol Use: No    Allergies: No Known Allergies  Prescriptions prior to admission  Medication Sig Dispense Refill  . Prenatal Vit-Fe Fumarate-FA (PRENATAL PO) Take 1 tablet by mouth daily.        Review of Systems  Gastrointestinal: Negative for abdominal pain.  Genitourinary:       Vaginal bleeding   Physical Exam   Blood pressure 104/59, pulse 79, temperature 98.4 F (36.9 C), temperature source Oral, resp. rate 16, height 5\' 7"  (1.702 m), weight 200 lb 6 oz (90.89 kg), last menstrual period 05/04/2011.  Physical Exam  Nursing note and vitals reviewed. Constitutional: She is oriented to person, place, and time. She appears well-developed and well-nourished.  HENT:  Head: Normocephalic.  Eyes: EOM are  normal.  Neck: Neck supple.  GI: Soft. There is no tenderness.       FHT 152 with doppler  Musculoskeletal: Normal range of motion.  Neurological: She is alert and oriented to person, place, and time.  Skin: Skin is warm and dry.  Psychiatric: She has a normal mood and affect.    MAU Course  Procedures  MDM Since FHT heard with doppler and having minimal bleeding with no abdominal pain, will discharge to follow up with prenatal care.  Assessment and Plan  Bleeding in pregnancy likely post coital bleeding. 9w 6d gestation  Plan Discharge home and follow up with prenatal care. No sex while you are having bleeding.   Brad Lieurance 07/26/2011, 8:20 AM

## 2011-07-26 NOTE — MAU Note (Signed)
Pt states she went home and took a nap for about an hour and woke up with large amount when she went to the bathroom

## 2011-07-26 NOTE — MAU Note (Signed)
This morning was just a little bleeding. Had a headache, took a nap, when I got up later had a lot of blood.  ? Something on tissue when wiped

## 2011-08-21 ENCOUNTER — Ambulatory Visit (INDEPENDENT_AMBULATORY_CARE_PROVIDER_SITE_OTHER): Payer: Self-pay | Admitting: Family Medicine

## 2011-08-21 ENCOUNTER — Encounter: Payer: Self-pay | Admitting: Family Medicine

## 2011-08-21 VITALS — BP 111/75 | Wt 203.5 lb

## 2011-08-21 DIAGNOSIS — Z3480 Encounter for supervision of other normal pregnancy, unspecified trimester: Secondary | ICD-10-CM

## 2011-08-21 DIAGNOSIS — Z3490 Encounter for supervision of normal pregnancy, unspecified, unspecified trimester: Secondary | ICD-10-CM

## 2011-08-21 LAB — GLUCOSE, CAPILLARY: Glucose-Capillary: 133 mg/dL — ABNORMAL HIGH (ref 70–99)

## 2011-08-21 NOTE — Progress Notes (Signed)
31 y.o. [id number] @ 15w 4d from LMP  CC: denies vaginal bleeding since 6/12 when she was evaluated in the MAU at [facility]; no contractions, nausea, or vomiting; does not want genetic testing O: BP 111/75  Wt 203 lb 8 oz (92.307 kg)  LMP 05/04/2011 A/P: 31 y.o. [id number] @ 15w 4d from LMP who is well appearing at this point with only complication of obesity.  1. Patient counseled on proper weight gain in pregnancy and told that she does not need to gain more than 20 lbs (already gained 14) 2. Early glucola ordered 2/2 obesity 3. Needs to apply for Medicaid but was not sure how, so I will consult Theresia Bough, LCSW 4. F/u in 4 weeks - order anatomy scan at that time

## 2011-08-21 NOTE — Patient Instructions (Addendum)
Dear Bonnie Mcneil,   Thank you for coming to clinic today. Please read below regarding the issues that we discussed.   Blood Sugar - I will let you know if your blood sugar is too high.   Please follow up in clinic in 4 weeks . Please call earlier if you have any questions or concerns.   Sincerely,   Dr. Clinton Sawyer    Pregnancy - Second Trimester The second trimester of pregnancy (3 to 6 months) is a period of rapid growth for you and your baby. At the end of the sixth month, your baby is about 9 inches long and weighs 1 1/2 pounds. You will begin to feel the baby move between 18 and 20 weeks of the pregnancy. This is called quickening. Weight gain is faster. A clear fluid (colostrum) may leak out of your breasts. You may feel small contractions of the womb (uterus). This is known as false labor or Braxton-Hicks contractions. This is like a practice for labor when the baby is ready to be born. Usually, the problems with morning sickness have usually passed by the end of your first trimester. Some women develop small dark blotches (called cholasma, mask of pregnancy) on their face that usually goes away after the baby is born. Exposure to the sun makes the blotches worse. Acne may also develop in some pregnant women and pregnant women who have acne, may find that it goes away. PRENATAL EXAMS  Blood work may continue to be done during prenatal exams. These tests are done to check on your health and the probable health of your baby. Blood work is used to follow your blood levels (hemoglobin). Anemia (low hemoglobin) is common during pregnancy. Iron and vitamins are given to help prevent this. You will also be checked for diabetes between 24 and 28 weeks of the pregnancy. Some of the previous blood tests may be repeated.   The size of the uterus is measured during each visit. This is to make sure that the baby is continuing to grow properly according to the dates of the pregnancy.   Your blood  pressure is checked every prenatal visit. This is to make sure you are not getting toxemia.   Your urine is checked to make sure you do not have an infection, diabetes or protein in the urine.   Your weight is checked often to make sure gains are happening at the suggested rate. This is to ensure that both you and your baby are growing normally.   Sometimes, an ultrasound is performed to confirm the proper growth and development of the baby. This is a test which bounces harmless sound waves off the baby so your caregiver can more accurately determine due dates.  Sometimes, a specialized test is done on the amniotic fluid surrounding the baby. This test is called an amniocentesis. The amniotic fluid is obtained by sticking a needle into the belly (abdomen). This is done to check the chromosomes in instances where there is a concern about possible genetic problems with the baby. It is also sometimes done near the end of pregnancy if an early delivery is required. In this case, it is done to help make sure the baby's lungs are mature enough for the baby to live outside of the womb. CHANGES OCCURING IN THE SECOND TRIMESTER OF PREGNANCY Your body goes through many changes during pregnancy. They vary from person to person. Talk to your caregiver about changes you notice that you are concerned about.  During the  second trimester, you will likely have an increase in your appetite. It is normal to have cravings for certain foods. This varies from person to person and pregnancy to pregnancy.   Your lower abdomen will begin to bulge.   You may have to urinate more often because the uterus and baby are pressing on your bladder. It is also common to get more bladder infections during pregnancy (pain with urination). You can help this by drinking lots of fluids and emptying your bladder before and after intercourse.   You may begin to get stretch marks on your hips, abdomen, and breasts. These are normal changes  in the body during pregnancy. There are no exercises or medications to take that prevent this change.   You may begin to develop swollen and bulging veins (varicose veins) in your legs. Wearing support hose, elevating your feet for 15 minutes, 3 to 4 times a day and limiting salt in your diet helps lessen the problem.   Heartburn may develop as the uterus grows and pushes up against the stomach. Antacids recommended by your caregiver helps with this problem. Also, eating smaller meals 4 to 5 times a day helps.   Constipation can be treated with a stool softener or adding bulk to your diet. Drinking lots of fluids, vegetables, fruits, and whole grains are helpful.   Exercising is also helpful. If you have been very active up until your pregnancy, most of these activities can be continued during your pregnancy. If you have been less active, it is helpful to start an exercise program such as walking.   Hemorrhoids (varicose veins in the rectum) may develop at the end of the second trimester. Warm sitz baths and hemorrhoid cream recommended by your caregiver helps hemorrhoid problems.   Backaches may develop during this time of your pregnancy. Avoid heavy lifting, wear low heal shoes and practice good posture to help with backache problems.   Some pregnant women develop tingling and numbness of their hand and fingers because of swelling and tightening of ligaments in the wrist (carpel tunnel syndrome). This goes away after the baby is born.   As your breasts enlarge, you may have to get a bigger bra. Get a comfortable, cotton, support bra. Do not get a nursing bra until the last month of the pregnancy if you will be nursing the baby.   You may get a dark line from your belly button to the pubic area called the linea nigra.   You may develop rosy cheeks because of increase blood flow to the face.   You may develop spider looking lines of the face, neck, arms and chest. These go away after the baby  is born.  HOME CARE INSTRUCTIONS   It is extremely important to avoid all smoking, herbs, alcohol, and unprescribed drugs during your pregnancy. These chemicals affect the formation and growth of the baby. Avoid these chemicals throughout the pregnancy to ensure the delivery of a healthy infant.   Most of your home care instructions are the same as suggested for the first trimester of your pregnancy. Keep your caregiver's appointments. Follow your caregiver's instructions regarding medication use, exercise and diet.   During pregnancy, you are providing food for you and your baby. Continue to eat regular, well-balanced meals. Choose foods such as meat, fish, milk and other low fat dairy products, vegetables, fruits, and whole-grain breads and cereals. Your caregiver will tell you of the ideal weight gain.   A physical sexual relationship may be  continued up until near the end of pregnancy if there are no other problems. Problems could include early (premature) leaking of amniotic fluid from the membranes, vaginal bleeding, abdominal pain, or other medical or pregnancy problems.   Exercise regularly if there are no restrictions. Check with your caregiver if you are unsure of the safety of some of your exercises. The greatest weight gain will occur in the last 2 trimesters of pregnancy. Exercise will help you:   Control your weight.   Get you in shape for labor and delivery.   Lose weight after you have the baby.   Wear a good support or jogging bra for breast tenderness during pregnancy. This may help if worn during sleep. Pads or tissues may be used in the bra if you are leaking colostrum.   Do not use hot tubs, steam rooms or saunas throughout the pregnancy.   Wear your seat belt at all times when driving. This protects you and your baby if you are in an accident.   Avoid raw meat, uncooked cheese, cat litter boxes and soil used by cats. These carry germs that can cause birth defects in the  baby.   The second trimester is also a good time to visit your dentist for your dental health if this has not been done yet. Getting your teeth cleaned is OK. Use a soft toothbrush. Brush gently during pregnancy.   It is easier to loose urine during pregnancy. Tightening up and strengthening the pelvic muscles will help with this problem. Practice stopping your urination while you are going to the bathroom. These are the same muscles you need to strengthen. It is also the muscles you would use as if you were trying to stop from passing gas. You can practice tightening these muscles up 10 times a set and repeating this about 3 times per day. Once you know what muscles to tighten up, do not perform these exercises during urination. It is more likely to contribute to an infection by backing up the urine.   Ask for help if you have financial, counseling or nutritional needs during pregnancy. Your caregiver will be able to offer counseling for these needs as well as refer you for other special needs.   Your skin may become oily. If so, wash your face with mild soap, use non-greasy moisturizer and oil or cream based makeup.  MEDICATIONS AND DRUG USE IN PREGNANCY  Take prenatal vitamins as directed. The vitamin should contain 1 milligram of folic acid. Keep all vitamins out of reach of children. Only a couple vitamins or tablets containing iron may be fatal to a baby or young child when ingested.   Avoid use of all medications, including herbs, over-the-counter medications, not prescribed or suggested by your caregiver. Only take over-the-counter or prescription medicines for pain, discomfort, or fever as directed by your caregiver. Do not use aspirin.   Let your caregiver also know about herbs you may be using.   Alcohol is related to a number of birth defects. This includes fetal alcohol syndrome. All alcohol, in any form, should be avoided completely. Smoking will cause low birth rate and premature  babies.   Street or illegal drugs are very harmful to the baby. They are absolutely forbidden. A baby born to an addicted mother will be addicted at birth. The baby will go through the same withdrawal an adult does.  SEEK MEDICAL CARE IF:  You have any concerns or worries during your pregnancy. It is better to  call with your questions if you feel they cannot wait, rather than worry about them. SEEK IMMEDIATE MEDICAL CARE IF:   An unexplained oral temperature above 102 F (38.9 C) develops, or as your caregiver suggests.   You have leaking of fluid from the vagina (birth canal). If leaking membranes are suspected, take your temperature and tell your caregiver of this when you call.   There is vaginal spotting, bleeding, or passing clots. Tell your caregiver of the amount and how many pads are used. Light spotting in pregnancy is common, especially following intercourse.   You develop a bad smelling vaginal discharge with a change in the color from clear to white.   You continue to feel sick to your stomach (nauseated) and have no relief from remedies suggested. You vomit blood or coffee ground-like materials.   You lose more than 2 pounds of weight or gain more than 2 pounds of weight over 1 week, or as suggested by your caregiver.   You notice swelling of your face, hands, feet, or legs.   You get exposed to [place] measles and have never had them.   You are exposed to fifth disease or chickenpox.   You develop belly (abdominal) pain. Round ligament discomfort is a common non-cancerous (benign) cause of abdominal pain in pregnancy. Your caregiver still must evaluate you.   You develop a bad headache that does not go away.   You develop fever, diarrhea, pain with urination, or shortness of breath.   You develop visual problems, blurry, or double vision.   You fall or are in a car accident or any kind of trauma.   There is mental or physical violence at home.  Document Released:  01/24/2001 Document Revised: 01/19/2011 Document Reviewed: 07/29/2008 [facility] Patient Information 2012 [place], [place].

## 2011-08-23 ENCOUNTER — Telehealth: Payer: Self-pay | Admitting: Clinical

## 2011-08-23 NOTE — Telephone Encounter (Signed)
Clinical Child psychotherapist (CSW) received a referral that pt unsure of how to obtain Medicaid. CSW left a vm for pt and will wait a call back.  Pt is to go to DSS at [address]. [place] to apply for Medicaid. The Medicaid process can take up to 45days. Pt must have a birth certificate, proof of income, pregnancy, and due date.   Theresia Bough, MSW, Theresia Majors [phone]

## 2011-09-05 ENCOUNTER — Encounter: Payer: Self-pay | Admitting: Family Medicine

## 2011-09-05 NOTE — Progress Notes (Signed)
Note reviewed.  Agree with plan.  Issues identified: Pregnancy dating - pt with several early ultrasounds.  Will use 8 5/7 on 6/413 to give dates of 02/22/12 Obesity- counseled by Dr. Clinton Sawyer on weight gain in pregnancy.  Early 1 hour was 133, reflecting some glucose intolerance.  Should try to watch carbs and try to exercise during pregnancy. Will need routine repeat glucose screening.

## 2011-09-07 ENCOUNTER — Telehealth: Payer: Self-pay | Admitting: Clinical

## 2011-09-07 NOTE — Telephone Encounter (Signed)
Clinical Child psychotherapist (CSW) contacted pt and informed her that Pt is to go to DSS at [employer]. [place] to apply for Medicaid. The Medicaid process can take up to 45days. Pt must have a birth certificate, proof of income, pregnancy, and due date. Pt appreciative of call and will follow up with DSS. No further CSW needs.  Theresia Bough, MSW, Theresia Majors [phone]

## 2011-09-22 ENCOUNTER — Ambulatory Visit (INDEPENDENT_AMBULATORY_CARE_PROVIDER_SITE_OTHER): Payer: Self-pay | Admitting: Family Medicine

## 2011-09-22 DIAGNOSIS — Z3480 Encounter for supervision of other normal pregnancy, unspecified trimester: Secondary | ICD-10-CM

## 2011-09-22 NOTE — Progress Notes (Signed)
31 y.o. F [id number] @ 18w 1d by early u/s who presents for routine follow up. No complaints. Patient not exercising.  A/P: normal pregnancy, no concerns at this time   1. Refer to anatomy u/s  2. Obesity - encouraged to walk daily for exercise  3. F/u in 4 weeks

## 2011-09-22 NOTE — Patient Instructions (Signed)
1. Please apply for Medicaid so you can have more financial assistance.  2. Walk daily to help stabilize your weight. 3. Follow up in 4 weeks.   Dr. Clinton Sawyer

## 2011-09-27 ENCOUNTER — Telehealth: Payer: Self-pay | Admitting: Family Medicine

## 2011-09-27 DIAGNOSIS — Z3490 Encounter for supervision of normal pregnancy, unspecified, unspecified trimester: Secondary | ICD-10-CM

## 2011-09-27 NOTE — Telephone Encounter (Signed)
Pt was told she needs an OB US from Dr Jani Gravel and she also has the orange card.  Wants to know how she can make this appt.

## 2011-09-28 NOTE — Telephone Encounter (Signed)
Patient without Medicaid. She does have the orange card, so if this covers an u/s at [facility], then that would be the preferred location. If this does not cover an u/s, then she needs an appointment at the least expensive external location. Thank you.

## 2011-09-28 NOTE — Telephone Encounter (Signed)
Patient is calling back because she hasn't heard anything back about where she needs to go for the Ultrasound and they need to accept the [facility].

## 2011-09-28 NOTE — Telephone Encounter (Signed)
Fwd. To Dr.Williamson for review. Lorenda Hatchet, Renato Battles

## 2011-09-29 ENCOUNTER — Telehealth: Payer: Self-pay | Admitting: Family Medicine

## 2011-09-29 NOTE — Telephone Encounter (Signed)
Scheduled OB US at [facility] for Friday 10-06-11 at 3 pm. Pt to arrive at 2:45 pm with picture ID and Insurance Card ([employer] card is not scanned into computer yet) Will fwd. To [employer] to call pt with appt info. Thank you .Arlyss Repress

## 2011-09-29 NOTE — Telephone Encounter (Signed)
I called pt and LVM. Pt should will call us back soon.  [employer]

## 2011-09-29 NOTE — Telephone Encounter (Signed)
Pt call us back and pt is aware about [place] appt @ [facility]. [employer]

## 2011-10-06 ENCOUNTER — Encounter: Payer: Self-pay | Admitting: Family Medicine

## 2011-10-06 ENCOUNTER — Ambulatory Visit (HOSPITAL_COMMUNITY)
Admission: RE | Admit: 2011-10-06 | Discharge: 2011-10-06 | Disposition: A | Discharge status: Home / Self Care | Payer: Self-pay | Source: Ambulatory Visit | Attending: Family Medicine | Admitting: Family Medicine

## 2011-10-06 DIAGNOSIS — Z3490 Encounter for supervision of normal pregnancy, unspecified, unspecified trimester: Secondary | ICD-10-CM

## 2011-10-06 DIAGNOSIS — E669 Obesity, unspecified: Secondary | ICD-10-CM | POA: Insufficient documentation

## 2011-10-06 DIAGNOSIS — Z1389 Encounter for screening for other disorder: Secondary | ICD-10-CM | POA: Insufficient documentation

## 2011-10-06 DIAGNOSIS — O99210 Obesity complicating pregnancy, unspecified trimester: Secondary | ICD-10-CM | POA: Insufficient documentation

## 2011-10-06 DIAGNOSIS — Z363 Encounter for antenatal screening for malformations: Secondary | ICD-10-CM | POA: Insufficient documentation

## 2011-10-06 DIAGNOSIS — O358XX0 Maternal care for other (suspected) fetal abnormality and damage, not applicable or unspecified: Secondary | ICD-10-CM | POA: Insufficient documentation

## 2011-10-15 ENCOUNTER — Encounter (HOSPITAL_COMMUNITY): Payer: Self-pay | Admitting: *Deleted

## 2011-10-15 ENCOUNTER — Inpatient Hospital Stay (HOSPITAL_COMMUNITY)
Admission: AD | Admit: 2011-10-15 | Discharge: 2011-10-15 | Disposition: A | Discharge status: Home / Self Care | Payer: Self-pay | Source: Ambulatory Visit | Attending: Family Medicine | Admitting: Family Medicine

## 2011-10-15 DIAGNOSIS — Z3480 Encounter for supervision of other normal pregnancy, unspecified trimester: Secondary | ICD-10-CM

## 2011-10-15 DIAGNOSIS — O99891 Other specified diseases and conditions complicating pregnancy: Secondary | ICD-10-CM | POA: Insufficient documentation

## 2011-10-15 DIAGNOSIS — O368190 Decreased fetal movements, unspecified trimester, not applicable or unspecified: Secondary | ICD-10-CM | POA: Insufficient documentation

## 2011-10-15 DIAGNOSIS — Z3490 Encounter for supervision of normal pregnancy, unspecified, unspecified trimester: Secondary | ICD-10-CM

## 2011-10-15 DIAGNOSIS — J069 Acute upper respiratory infection, unspecified: Secondary | ICD-10-CM | POA: Insufficient documentation

## 2011-10-15 NOTE — MAU Provider Note (Signed)
History     CSN: [id number]  Arrival date and time: 10/15/11 0930   First Provider Initiated Contact with Patient 10/15/11 1003      Chief Complaint  Patient presents with  . Decreased Fetal Movement   HPI Pt is a 31 y.o. [id number] at [redacted]w[redacted]d presenting for decreased fetal movement since yesterday. Last normal movement noted yesterday AM around 5 or 6 AM. Normally baby is active around 3 or 4 PM and again at about 8 PM but pt states she did not feel usual activity at these times yesterday nor this morning when she woke up. No ctx, lof, vag bleeding. Pt has had cough/sore throat for 3 days. No fever/chills. Had normal fetal survey on 8/23 (22 wks) (RVOT and aortic arch not visualized). No complications this pregnancy except some bleeding a month ago that has resolved. No problems with previous pregnancies.   OB History    Grav Para Term Preterm Abortions TAB SAB Ect Mult Living   4 2 2  0 1 1 0 0 0 2      Past Medical History  Diagnosis Date  . Cholecystitis 2008  . Intrauterine pregnancy 2004, 2008  . Therapeutic abortion in first trimester 2008    secondary to concern for baby during cholecystecomy  . Chlamydia 2004    Past Surgical History  Procedure Date  . Cholecystectomy 2008   Pt also states she has a "laser" procedure on cervix after last delivery.   Family History  Problem Relation Age of Onset  . Anesthesia problems Neg Hx   . Hypotension Neg Hx   . Malignant hyperthermia Neg Hx   . Pseudochol deficiency Neg Hx     History  Substance Use Topics  . Smoking status: Never Smoker   . Smokeless tobacco: Never Used  . Alcohol Use: No    Allergies: No Known Allergies  Prescriptions prior to admission  Medication Sig Dispense Refill  . Prenatal Vit-Fe Fumarate-FA (PRENATAL MULTIVITAMIN) TABS Take 1 tablet by mouth daily.        Review of Systems  Constitutional: Negative for fever and chills.  Eyes: Negative for blurred vision and double vision.    Respiratory: Positive for cough. Negative for shortness of breath and wheezing.   Cardiovascular: Positive for chest pain.  Gastrointestinal: Negative for nausea, vomiting and abdominal pain.  Genitourinary: Negative for dysuria and urgency.  Musculoskeletal: Negative for back pain.  Skin: Negative for rash.  Neurological: Negative for dizziness and headaches.   Physical Exam   Blood pressure 110/69, pulse 89, temperature 98.8 F (37.1 C), temperature source Oral, resp. rate 18, height 5\' 7"  (1.702 m), weight 97.523 kg (215 lb), last menstrual period 05/04/2011.  Physical Exam  Vitals reviewed. Constitutional: She is oriented to person, place, and time. She appears well-developed and well-nourished. No distress.  HENT:  Head: Normocephalic and atraumatic.       Oropharynx - mild erythema, enlarged tonsils, no exudate.  Eyes: Conjunctivae and EOM are normal.  Neck: Normal range of motion. Neck supple.  Cardiovascular: Normal rate, regular rhythm and normal heart sounds.   Respiratory: Effort normal and breath sounds normal. No respiratory distress. She has no wheezes.  GI: Soft. She exhibits no distension. There is no tenderness. There is no rebound and no guarding.       Gravid, size appropriate for dates  Musculoskeletal: She exhibits no edema and no tenderness.  Neurological: She is alert and oriented to person, place, and time.  Skin: Skin is  warm and dry.  Psychiatric: She has a normal mood and affect.      MAU Course  Procedures  NST:  30 minute strip, baseline 135-140, moderate variability, accels present, occasional variable decel 30 sec or less.   Assessment and Plan  31 y.o. [id number] at [redacted]w[redacted]d with decreased fetal movement NST reassuring, no signs preterm labor, reassurance. F/U with PCP on 9/10. URI - likely viral, no fever, wheezing, resp distress.  Supportive care.    Napoleon Form 10/15/2011, 10:04 AM

## 2011-10-15 NOTE — MAU Note (Signed)
Pt reports not feeling baby move since 5pm yesterday. Denies  pain or discomfort at this time.

## 2011-10-24 ENCOUNTER — Ambulatory Visit (INDEPENDENT_AMBULATORY_CARE_PROVIDER_SITE_OTHER): Payer: Self-pay | Admitting: Family Medicine

## 2011-10-24 VITALS — BP 110/72 | Wt 217.0 lb

## 2011-10-24 DIAGNOSIS — Z23 Encounter for immunization: Secondary | ICD-10-CM

## 2011-10-24 DIAGNOSIS — Z3490 Encounter for supervision of normal pregnancy, unspecified, unspecified trimester: Secondary | ICD-10-CM

## 2011-10-24 DIAGNOSIS — Z3480 Encounter for supervision of other normal pregnancy, unspecified trimester: Secondary | ICD-10-CM

## 2011-10-24 NOTE — Progress Notes (Signed)
31 y.o. F [id number] @ 24w 5d by LMP who presents for routine prenatal care. On 9/1 the patient went to the MAU for evaluation of decreased fetal movement and was found to have a reactive NST. Her anatomy u/s was performed on 10/06/11 and found no anomaly and thinks that the baby is female but was not a good image.The patient has no complaints. She is exercising with minimal walking daily.  BP 110/72  Wt 217 lb (98.431 kg)  LMP 05/04/2011 Gen: well appearing, obese, Hispanic female CV: RRR, no murmurs Pulm: Lungs CTA-B Ext: no swelling Abd: gravid, 27 cm fundus, non tender A/P: 31 y.o. F [id number] @ 24w 5d by LMP   1. Receive flu vaccine today  2. At 28 week visit, needs 1 hour GTT, CBC, HIV, RPR, TDaP; Rh positive  3. Schedule f/u in OB clinic after next visit

## 2011-10-24 NOTE — Patient Instructions (Signed)
Dear Mrs. Wemhoff,   Thank you for coming to clinic today. Please read below regarding the issues that we discussed.   Please follow up in 3 weeks - You will need to have a 1 hour visit to drink the sugar. We will also draw labs.   Vaccinations - Today we will give you a flu vaccination, which we recommend for all pregnant patients. Additionally, you will need a tetanus vaccination at your next appointment.   Please call earlier if you have any questions or concerns.   Sincerely,   Dr. Clinton Sawyer

## 2011-11-16 ENCOUNTER — Ambulatory Visit (INDEPENDENT_AMBULATORY_CARE_PROVIDER_SITE_OTHER): Payer: Self-pay | Admitting: Family Medicine

## 2011-11-16 VITALS — BP 113/73 | Temp 98.0°F | Wt 221.0 lb

## 2011-11-16 DIAGNOSIS — Z23 Encounter for immunization: Secondary | ICD-10-CM

## 2011-11-16 DIAGNOSIS — Z3490 Encounter for supervision of normal pregnancy, unspecified, unspecified trimester: Secondary | ICD-10-CM

## 2011-11-16 DIAGNOSIS — K219 Gastro-esophageal reflux disease without esophagitis: Secondary | ICD-10-CM

## 2011-11-16 DIAGNOSIS — Z3480 Encounter for supervision of other normal pregnancy, unspecified trimester: Secondary | ICD-10-CM

## 2011-11-16 LAB — RPR

## 2011-11-16 LAB — GLUCOSE, CAPILLARY
Comment 1: 1
Glucose-Capillary: 119 mg/dL — ABNORMAL HIGH (ref 70–99)

## 2011-11-16 LAB — HIV ANTIBODY (ROUTINE TESTING W REFLEX): HIV: NONREACTIVE

## 2011-11-16 LAB — CBC
MCHC: 34.1 g/dL (ref 30.0–36.0)
RDW: 13.2 % (ref 11.5–15.5)

## 2011-11-16 MED ORDER — RANITIDINE HCL 150 MG PO TABS: 150.0000 mg | ORAL_TABLET | Freq: Two times a day (BID) | ORAL | Status: DC

## 2011-11-16 NOTE — Progress Notes (Signed)
S: Patient concerned about a cough that has been persistent for a month. It is productive and associated with an congested nose. She denies fever. She also notes acid reflux.  O: BP 113/73  Wt 221 lb (100.245 kg)  LMP 05/04/2011  Gen: well appearing, pleasant and conversant, obese  HEENT: oropharynx clear, no sinus tenderness  Pulm: CTA-B  Abd: gravid, non tender, non distended   Ext: no edema A/P: 31 y.o. [id number] @ 28w 0d for rountine prenatal care.   - obtain 1 hour GTT, HIV, RPR, CBC and give TDaP today  - counseled on pre-tern labor  -  f/u in 2 weeks, schedule OB clinic visit after next visit

## 2011-11-16 NOTE — Patient Instructions (Signed)
Please start taking Ranitidine 2 times a day for acid reflux. Please come back to see me in 2 weeks for the next check.   Call the office if you have any questions or concerns. If you think it is an emergency, then please go to [facility].    Take Care,   Dr. Clinton Sawyer

## 2011-11-29 ENCOUNTER — Ambulatory Visit (INDEPENDENT_AMBULATORY_CARE_PROVIDER_SITE_OTHER): Payer: Self-pay | Admitting: Family Medicine

## 2011-11-29 DIAGNOSIS — Z3480 Encounter for supervision of other normal pregnancy, unspecified trimester: Secondary | ICD-10-CM

## 2011-11-29 NOTE — Progress Notes (Signed)
S: no complaints, taking daily pre-natal vitamins as well as ranitidine for reflux O: BP 111/73  Wt 225 lb (102.059 kg)  LMP 05/04/2011  Gen: well appearing, NAD, pleasant and conversant  Abd: gravid, non-tender, fundus at 32 cm  Extr: no edema  A/P: 31 y.o. [id number] @ 29w 6d from LMP who presents for routine prenatal care.   - All 28 labs WNL  - conseled on pre-term labor  - will f/u in OB clinic in 2 weeks

## 2011-11-29 NOTE — Addendum Note (Signed)
Addended by: Garnetta Buddy on: 11/29/2011 08:59 AM   Modules accepted: Level of Service

## 2011-12-14 NOTE — Progress Notes (Signed)
Dating: LMP of  05/04/11 gives EDD of 02/08/12. Sono at 8 and 5/7 gives EDD of 02/22/12 by CRL. Sono at 11 and 3/7 gives EDD of 02/11/12 by CRL. Sono at 21 and 6/7 gives EDD of 02/10/12. Earliest ultrasound is outlier.  Would use LMP for dating.

## 2011-12-21 ENCOUNTER — Ambulatory Visit: Payer: Self-pay | Admitting: Family Medicine

## 2011-12-21 VITALS — BP 100/67 | Wt 229.0 lb

## 2011-12-21 DIAGNOSIS — Z3490 Encounter for supervision of normal pregnancy, unspecified, unspecified trimester: Secondary | ICD-10-CM

## 2011-12-21 NOTE — Progress Notes (Signed)
Routine prenatal care visit. Bonnie Mcneil is a 31 yo [id number]. No complaints today. Has been taking ranitidine for reflux, which has helped her a lot. She went to a place like [facility] to get [place] pictures of her baby and was told she is having a girl. They also mentioned there that the placenta is laying lower in her uterus and the baby is resting on it. She is concerned about the placenta moving downward and having to have a c-section. Pt concerned about weight gain with future birth control and is interested in starting OCPs after delivery.  -Educated pt that baby is not likely to "push" the placenta downward and the placenta is actually more likely to move upward than downward during a pregnancy due to the growth of the uterus.  -Continue prenatal vitamins and ranitidine for GERD.  -Return in 1 week to recheck fundal height, as it was measured larger than gestational age at this visit.  -Discussed walking for some exercise, but if started having bleeding or cramping from walking to stop and rest. She was also instructed to drink plenty of water to avoid dehydration.

## 2011-12-21 NOTE — Progress Notes (Signed)
Bonnie Mcneil is a 31 y.o. [id number] at [redacted]w[redacted]d for routine follow up.  She reports some concerns that the baby will push the placenta down.  See flow sheet for details.  A/P: Pregnancy at [redacted]w[redacted]d.  Doing well.   Pregnancy issues include  Obesity - passed 1 hour glucolas x 2. Counseled on weight gain in pregnancy. Dating - will not use the 8 week ultrasound because her LMP and 2 other ultrasounds agree.   Size>dates - will return in 1 week for recheck of fundal height.  If continues Size>dates, consider repeat ultrasound to assess growth.  Infant feeding choice unsure Contraception choice: OCPs (has used successfully in the past) Infant circumcision desired not applicable  Tdapwas not given today. (already done) GBS/GC/CZ testing was not performed today.  Preterm labor precautions reviewed. Safe sleep discussed. Kick counts reviewed. Follow up 2 weeks.

## 2011-12-21 NOTE — Patient Instructions (Addendum)
It was nice to meet you today.   Please come back in 1 week for another OB visit. Try to walk everyday.   If your baby is not moving well, if you have vaginal bleeding, if your water breaks, or if you have 4 contractions in 1 hour and they don't go away, please go to [facility]. Call us with any concerns.

## 2011-12-22 ENCOUNTER — Inpatient Hospital Stay (HOSPITAL_COMMUNITY)
Admission: AD | Admit: 2011-12-22 | Discharge: 2011-12-23 | Disposition: A | Discharge status: Home / Self Care | Payer: Self-pay | Source: Ambulatory Visit | Attending: Obstetrics & Gynecology | Admitting: Obstetrics & Gynecology

## 2011-12-22 ENCOUNTER — Encounter (HOSPITAL_COMMUNITY): Payer: Self-pay | Admitting: *Deleted

## 2011-12-22 DIAGNOSIS — R109 Unspecified abdominal pain: Secondary | ICD-10-CM | POA: Insufficient documentation

## 2011-12-22 DIAGNOSIS — O99891 Other specified diseases and conditions complicating pregnancy: Secondary | ICD-10-CM | POA: Insufficient documentation

## 2011-12-22 DIAGNOSIS — N949 Unspecified condition associated with female genital organs and menstrual cycle: Secondary | ICD-10-CM

## 2011-12-22 LAB — URINALYSIS, ROUTINE W REFLEX MICROSCOPIC
Bilirubin Urine: NEGATIVE
Ketones, ur: 15 mg/dL — AB
Nitrite: NEGATIVE
Urobilinogen, UA: 0.2 mg/dL (ref 0.0–1.0)

## 2011-12-22 LAB — URINE MICROSCOPIC-ADD ON

## 2011-12-22 NOTE — MAU Provider Note (Signed)
Bonnie Mcneil is a 31 y.o. female presenting for eval of intermittent low abd pain. No precipitating factors; feels approx 4-5 times/hr. Denies leak or bldg. Has received prenatal care at the [facility] and at her next visit will have an eval of baby's size/presentation. History OB History    Grav Para Term Preterm Abortions TAB SAB Ect Mult Living   4 2 2  0 1 1 0 0 0 2     Past Medical History  Diagnosis Date  . Cholecystitis 2008  . Intrauterine pregnancy 2004, 2008  . Therapeutic abortion in first trimester 2008    secondary to concern for baby during cholecystecomy  . Chlamydia 2004   Past Surgical History  Procedure Date  . Cholecystectomy 2008   Family History: family history is negative for Anesthesia problems, and Hypotension, and Malignant hyperthermia, and Pseudochol deficiency, . Social History:  reports that she has never smoked. She has never used smokeless tobacco. She reports that she does not drink alcohol or use illicit drugs.   ROS  Dilation: Closed Effacement (%): Thick Station: -3 Exam by:: Philipp Deputy CNM- presenting part undeterminable by exam; vtx by Leopold's Blood pressure 125/66, pulse 89, temperature 97.9 F (36.6 C), temperature source Oral, resp. rate 20, height 5\' 7"  (1.702 m), weight 105.597 kg (232 lb 12.8 oz), last menstrual period 05/04/2011, SpO2 99.00%. Maternal Exam:  Uterine Assessment: No ctx per toco     Fetal Exam Fetal Monitor Review: Baseline rate: 125.  Variability: moderate (6-25 bpm).   Pattern: no decelerations and accelerations present.       Physical Exam  Constitutional: She is oriented to person, place, and time. She appears well-developed.  HENT:  Head: Normocephalic.  Cardiovascular: Normal rate.   Respiratory: Effort normal.  Genitourinary: Vagina normal.  Musculoskeletal: Normal range of motion.  Neurological: She is alert and oriented to person, place, and time.  Skin: Skin is warm and dry.    Psychiatric: She has a normal mood and affect. Her behavior is normal.    Urinalysis    Component Value Date/Time   COLORURINE YELLOW 12/22/2011 2300   APPEARANCEUR CLEAR 12/22/2011 2300   LABSPEC 1.015 12/22/2011 2300   PHURINE 6.0 12/22/2011 2300   GLUCOSEU NEGATIVE 12/22/2011 2300   HGBUR NEGATIVE 12/22/2011 2300   BILIRUBINUR NEGATIVE 12/22/2011 2300   KETONESUR 15* 12/22/2011 2300   PROTEINUR NEGATIVE 12/22/2011 2300   UROBILINOGEN 0.2 12/22/2011 2300   NITRITE NEGATIVE 12/22/2011 2300   LEUKOCYTESUR SMALL* 12/22/2011 2300     Prenatal labs: ABO, Rh: O/POS/-- (05/28 1020) Antibody: NEG (05/28 1020) Rubella: >500.0 (05/28 1020) RPR: NON REAC (10/03 1048)  HBsAg: NEGATIVE (05/28 1020)  HIV: NON REACTIVE (10/03 1048)  GBS:     Assessment/Plan: IUP at 33.2 wks Round lig pain  D/C home with comfort tips rev'd and preterm labor precautions F/U as sched with [facility] for eval of fetal growth/presentation   Bonnie Mcneil 12/22/2011, 11:50 PM

## 2011-12-22 NOTE — MAU Note (Signed)
Pain in lower abd which comes and goes. Some contractions. Sometimes have trouble breathing but think may be during a contraction.

## 2011-12-27 ENCOUNTER — Ambulatory Visit (INDEPENDENT_AMBULATORY_CARE_PROVIDER_SITE_OTHER): Payer: No Typology Code available for payment source | Admitting: Family Medicine

## 2011-12-27 VITALS — BP 115/75 | Wt 231.0 lb

## 2011-12-27 DIAGNOSIS — Z3490 Encounter for supervision of normal pregnancy, unspecified, unspecified trimester: Secondary | ICD-10-CM

## 2011-12-27 DIAGNOSIS — Z3480 Encounter for supervision of other normal pregnancy, unspecified trimester: Secondary | ICD-10-CM

## 2011-12-27 NOTE — Progress Notes (Signed)
Bonnie Mcneil is a is a 31 y.o. [id number] at [redacted]w[redacted]d for routine f/u. She is feeling well today and denies current contractions. She did present to the MAU on 12/21/11 for pain which was determined to be round ligament pain.   A/P: Size > dates, so send for ultrasound to confirm baby size         Next visit perform GC/CT/GBS testing         Discussed pre-term labor, breast feeding, and contraception

## 2012-01-01 ENCOUNTER — Ambulatory Visit (HOSPITAL_COMMUNITY)
Admission: RE | Admit: 2012-01-01 | Discharge: 2012-01-01 | Disposition: A | Discharge status: Home / Self Care | Payer: Self-pay | Source: Ambulatory Visit | Attending: Family Medicine | Admitting: Family Medicine

## 2012-01-01 ENCOUNTER — Other Ambulatory Visit: Payer: Self-pay | Admitting: Family Medicine

## 2012-01-01 DIAGNOSIS — Z3490 Encounter for supervision of normal pregnancy, unspecified, unspecified trimester: Secondary | ICD-10-CM

## 2012-01-01 DIAGNOSIS — E669 Obesity, unspecified: Secondary | ICD-10-CM | POA: Insufficient documentation

## 2012-01-01 DIAGNOSIS — O3660X0 Maternal care for excessive fetal growth, unspecified trimester, not applicable or unspecified: Secondary | ICD-10-CM | POA: Insufficient documentation

## 2012-01-03 ENCOUNTER — Telehealth: Payer: Self-pay | Admitting: Family Medicine

## 2012-01-03 NOTE — Telephone Encounter (Signed)
Patient informed that ultrasounds was WNL. Plan to f/u for regular OB visit next week.

## 2012-01-05 ENCOUNTER — Encounter: Payer: Self-pay | Admitting: Family Medicine

## 2012-01-10 ENCOUNTER — Other Ambulatory Visit (HOSPITAL_COMMUNITY)
Admission: RE | Admit: 2012-01-10 | Discharge: 2012-01-10 | Disposition: A | Discharge status: Home / Self Care | Payer: Self-pay | Source: Ambulatory Visit | Attending: Family Medicine | Admitting: Family Medicine

## 2012-01-10 ENCOUNTER — Ambulatory Visit (INDEPENDENT_AMBULATORY_CARE_PROVIDER_SITE_OTHER): Payer: No Typology Code available for payment source | Admitting: Family Medicine

## 2012-01-10 VITALS — BP 110/70 | Wt 237.0 lb

## 2012-01-10 DIAGNOSIS — Z113 Encounter for screening for infections with a predominantly sexual mode of transmission: Secondary | ICD-10-CM | POA: Insufficient documentation

## 2012-01-10 DIAGNOSIS — Z3490 Encounter for supervision of normal pregnancy, unspecified, unspecified trimester: Secondary | ICD-10-CM

## 2012-01-10 DIAGNOSIS — Z3480 Encounter for supervision of other normal pregnancy, unspecified trimester: Secondary | ICD-10-CM

## 2012-01-10 NOTE — Patient Instructions (Addendum)
Please go to the [facility] is you feel regular contraction 10 minutes apart for 2 or more hours. This could mean that you are in labor. Also, please read below regarding the kick count.   Fetal Monitoring, Fetal Movement Assessment Fetal movement assessment (FMA) is done by the pregnant woman herself by counting and recording the baby's movements over a certain time period. It is done to see if there are problems with the pregnancy and the baby. Identifying and correcting problems may prevent serious problems from developing with the fetus, including fetal loss. Some pregnancies are complicated by the mother's medical problems. Some of these problems are type 1 diabetes mellitus, high blood pressure and other chronic medical illnesses. This is why it is important to monitor the baby before birth.  OTHER TECHNIQUES OF MONITORING YOUR BABY BEFORE BIRTH Several tests are in use. These include:  Nonstress test (NST). This test monitors the baby's heart rate when the baby moves.  Contraction stress test (CST). This test monitors the baby's heart rate during a contraction of the uterus.  Fetal biophysical profile (BPP). This measures and evaluates 5 observations of the baby:  The nonstress test.  The baby's breathing.  The baby's movements.  The baby's muscle tone.  The amount of amniotic fluid.  Modified BPP. This measures the volume of fluid in different parts of the amniotic sac (amniotic fluid index) and the results of the nonstress test.  Umbilical artery doppler velocimetry. This evaluates the blood flow through the umbilical cord. There are several very serious problems that cannot be predicted or detected with any of the fetal monitoring procedures. These problems include separation (abruption) of the placenta or when the fetus chokes on the umbilical cord (umbilical cord accident). Your caregiver will help you understand your tests and what they mean for you and your baby. It is  your responsibility to obtain the results of your test. LET YOUR CAREGIVER KNOW ABOUT:   Any medications you are taking including prescription and over-the-counter drugs, herbs, eye drops and creams.  If you have a fever.  If you have an infection.  If you are sick. RISKS AND COMPLICATIONS  There are no risks or complications to the mother or fetus with FMA. BEFORE THE PROCEDURE  Do not take medications that may decrease or increase the baby's heart rate and/or movements.  Eat a full meal at least 2 hours before the test.  Do not smoke if you are pregnant. If you smoke, stop at least 2 days before the test. It is best not to smoke at all when you are pregnant. PROCEDURE Sometimes, a mother notices her baby moves less before there are problems. Because of this, it is believed that fetal movement checking by the mother (kick counts) is a good way to check the baby before birth. There are different ways of doing this. Two good ways are:  The woman lies on her side and counts distinct (individual) fetal movements. A feeling of 10 distinct movements in a period of up to 2 hours is considered reassuring. When 10 movements are felt, you may stop counting.  Women are instructed to count fetal movements for 1 hour, three times per week. The count is good if, after one week, it equals or is over the woman's previously established baseline count. If the count is lower, further checking of your baby is needed. AFTER THE PROCEDURE You may resume your usual activities. HOME CARE INSTRUCTIONS   Follow your caregiver's advice and recommendations.  Be aware of your baby's movements. Are they normal, less than usual or more than usual?  Make and keep the rest of your prenatal appointments. SEEK MEDICAL CARE IF:   You develop a temperature of 100 F (37.8 C) or higher.  You have a bloody mucus discharge from the vagina (a bloody show). SEEK IMMEDIATE MEDICAL CARE IF:   You do not feel the  baby move.  You think the baby's movements are too little or too many.  You develop contractions.  You develop vaginal bleeding.  You have belly (abdominal) pain.  You have leaking or a gush of fluid from the vagina. Document Released: 01/20/2002 Document Revised: 04/24/2011 Document Reviewed: 05/25/2008 [facility] Patient Information 2013 [place], [place].

## 2012-01-10 NOTE — Progress Notes (Signed)
31 y.o. [id number] @ 35w 6d presenting for routine pre-natal visit. Denies bleeding or leakage of fluids.   O: BP 110/70  Wt 237 lb (107.502 kg)  LMP 05/04/2011  A/P: - f/u GC, CT, and GBS performed today - monitor kick counts and contractions - F/u in 1 week

## 2012-01-19 ENCOUNTER — Inpatient Hospital Stay (HOSPITAL_COMMUNITY)
Admission: AD | Admit: 2012-01-19 | Discharge: 2012-01-19 | Disposition: A | Discharge status: Home / Self Care | Payer: Self-pay | Source: Ambulatory Visit | Attending: Obstetrics and Gynecology | Admitting: Obstetrics and Gynecology

## 2012-01-19 ENCOUNTER — Telehealth: Payer: Self-pay | Admitting: Family Medicine

## 2012-01-19 ENCOUNTER — Ambulatory Visit (INDEPENDENT_AMBULATORY_CARE_PROVIDER_SITE_OTHER): Payer: No Typology Code available for payment source | Admitting: Family Medicine

## 2012-01-19 VITALS — BP 113/75 | Temp 98.1°F | Wt 239.1 lb

## 2012-01-19 DIAGNOSIS — Z3490 Encounter for supervision of normal pregnancy, unspecified, unspecified trimester: Secondary | ICD-10-CM

## 2012-01-19 DIAGNOSIS — O321XX0 Maternal care for breech presentation, not applicable or unspecified: Secondary | ICD-10-CM

## 2012-01-19 DIAGNOSIS — Z3480 Encounter for supervision of other normal pregnancy, unspecified trimester: Secondary | ICD-10-CM

## 2012-01-19 NOTE — MAU Note (Signed)
  History     CSN: [id number]  Arrival date and time: 01/19/12 1843   None     No chief complaint on file.  HPI Patient history received from Dr Clinton Sawyer.  Patient desired further info about external version. She is reluctant to proceed with version as the baby, in her opinion, has flipped several times in the latter parts of pregnancy. She is given a lengthy discussion of the technical aspects of external version, and she will keep that in mind.  At current she does NOT desire version effort, but rather prefers referral for cesarean scheduling at 39 weeks if the baby does not convert spontaneously prior to 39 weeks.  Dr Clinton Sawyer will arrange referral to [facility] in the following week to schedule C/S if fetus remains breech.    Past Medical History  Diagnosis Date  . Cholecystitis 2008  . Intrauterine pregnancy 2004, 2008  . Therapeutic abortion in first trimester 2008    secondary to concern for baby during cholecystecomy  . Chlamydia 2004    Past Surgical History  Procedure Date  . Cholecystectomy 2008    Family History  Problem Relation Age of Onset  . Anesthesia problems Neg Hx   . Hypotension Neg Hx   . Malignant hyperthermia Neg Hx   . Pseudochol deficiency Neg Hx     History  Substance Use Topics  . Smoking status: Never Smoker   . Smokeless tobacco: Never Used  . Alcohol Use: No    Allergies: No Known Allergies  Prescriptions prior to admission  Medication Sig Dispense Refill  . Prenatal Vit-Fe Fumarate-FA (PRENATAL MULTIVITAMIN) TABS Take 1 tablet by mouth daily.      . ranitidine (ZANTAC) 150 MG tablet Take 1 tablet (150 mg total) by mouth 2 (two) times daily.  60 tablet  1    ROS Physical Exam   Last menstrual period 05/04/2011.  Physical Exam  MAU Course  ProceduresCounselling. Only .  MDM   Assessment and Plan  Pregnancy 37 weeks, Breech presentation.  Plan:  Keep appt with dr Clinton Sawyer, who will schedule [facility] followup as  indicated.  Samera Macy V 01/19/2012, 7:07 PM

## 2012-01-19 NOTE — MAU Note (Signed)
Per Dr. Emelda Fear patients visit was a consult only.

## 2012-01-19 NOTE — Progress Notes (Signed)
31 y.o. [id number] @ [redacted]w[redacted]d  presenting for routine pre-natal visit. She notes irregular contraction and denies leakage of fluids or vag bleeding.   Exam: cervix long thick and closed; baby in breech position via ultrasound and leopold manuevers  A/P: 31 y.o.  [id number] @ [redacted]w[redacted]d who presents for routine PNC and is found to have the baby in a breech position.  - She has been scheduled to have version performed on 01/23/12 but she is uncomfortable with that idea.  - She will consult with Dr. Emelda Fear, OB-GYN on call tonight - If she is still uncomfortable with version, then refer to high risk clinic for breech eval and c-section counseling

## 2012-01-19 NOTE — Telephone Encounter (Signed)
Patient is calling because she wants to discuss the plan to turn her baby and the fact that it is a dangerous procedure.  She is very nervous and anxious and wants to speak to Dr. Clinton Sawyer before her appt on 12/11.

## 2012-01-19 NOTE — Telephone Encounter (Signed)
Fwd. To Dr.Williamson to address. Lorenda Hatchet, Renato Battles

## 2012-01-19 NOTE — Telephone Encounter (Signed)
Patient concerned about the risks of version and would prefer a primary c-section. She was counseled extensively on her options and at this time would not like the version. I will call the attending OB physician on call to seek advice.   Dr. Emelda Fear is the [facility] attending on call for the teaching service tonight. He said that he would be happy to counsel her regarding the options for version vs. c-section. Otherwise, she can be scheduled for high risk clinic next week for counseling about c-section.   I returned the call to Mrs. Baston who would like to talk to Dr. Emelda Fear tonight. She is going to the MAU this evening to discuss her options with him.

## 2012-01-22 ENCOUNTER — Telehealth: Payer: Self-pay | Admitting: Family Medicine

## 2012-01-22 NOTE — Telephone Encounter (Signed)
Patient is calling wanting to speak to Dr. Clinton Sawyer about her options if she chooses not to turn the baby.

## 2012-01-22 NOTE — Telephone Encounter (Signed)
Fwd. To Dr.Williamson for info. Lorenda Hatchet, Renato Battles

## 2012-01-22 NOTE — Telephone Encounter (Signed)
Fwd. To Dr.Williamson. Lorenda Hatchet, Renato Battles

## 2012-01-22 NOTE — Telephone Encounter (Signed)
I returned the call to the patient stating that I DO want her to go to [facility] hospital for an ultrasound on Wednesday AM to check the position of the baby. Then she should come to our clinic. I asked that she call the Family Practice center to let me know that she received the message and understands what to do.

## 2012-01-22 NOTE — Telephone Encounter (Signed)
Patient is calling to let Dr. Clinton Sawyer know that she got his message.

## 2012-01-22 NOTE — Telephone Encounter (Signed)
I left a message for the patient that she should keep her appointment with me on Wednesday morning at [facility]. She does not need an ultrasound on Wednesday morning. If we are still concerned about the baby being in a breech position, then we will send her to high risk clinic at [facility] on Thursday AM.

## 2012-01-23 ENCOUNTER — Inpatient Hospital Stay (HOSPITAL_COMMUNITY): Payer: Self-pay

## 2012-01-24 ENCOUNTER — Other Ambulatory Visit: Payer: Self-pay | Admitting: Family Medicine

## 2012-01-24 ENCOUNTER — Ambulatory Visit (HOSPITAL_COMMUNITY)
Admission: RE | Admit: 2012-01-24 | Discharge: 2012-01-24 | Disposition: A | Discharge status: Home / Self Care | Payer: Self-pay | Source: Ambulatory Visit | Attending: Family Medicine | Admitting: Family Medicine

## 2012-01-24 ENCOUNTER — Ambulatory Visit (INDEPENDENT_AMBULATORY_CARE_PROVIDER_SITE_OTHER): Payer: No Typology Code available for payment source | Admitting: Family Medicine

## 2012-01-24 VITALS — BP 125/82 | Wt 240.0 lb

## 2012-01-24 DIAGNOSIS — Z3490 Encounter for supervision of normal pregnancy, unspecified, unspecified trimester: Secondary | ICD-10-CM

## 2012-01-24 DIAGNOSIS — Z1389 Encounter for screening for other disorder: Secondary | ICD-10-CM | POA: Insufficient documentation

## 2012-01-24 DIAGNOSIS — Z3480 Encounter for supervision of other normal pregnancy, unspecified trimester: Secondary | ICD-10-CM

## 2012-01-24 DIAGNOSIS — Z363 Encounter for antenatal screening for malformations: Secondary | ICD-10-CM | POA: Insufficient documentation

## 2012-01-24 NOTE — Progress Notes (Signed)
31 y.o. [id number] @ [redacted]w[redacted]d presenting for f/u. Concern that baby was in a breeched position last week, however, she had an ultrasound this morning that showed baby in a vertex presentation. The patient is still concerned that she will need a c-section. Denies bleeding, LOF, or regular contractions.   A/P: Patient does not need to be seen in high risk as previously discussed. She does need to follow up with me in one week of MAU if she has regular contractions or rupture of membranes. Patient counseled on what will happen if the baby's position changes unexpectedly.

## 2012-01-31 ENCOUNTER — Ambulatory Visit (HOSPITAL_COMMUNITY)
Admission: RE | Admit: 2012-01-31 | Discharge: 2012-01-31 | Disposition: A | Discharge status: Home / Self Care | Payer: No Typology Code available for payment source | Source: Ambulatory Visit | Attending: Family Medicine | Admitting: Family Medicine

## 2012-01-31 ENCOUNTER — Ambulatory Visit (INDEPENDENT_AMBULATORY_CARE_PROVIDER_SITE_OTHER): Payer: No Typology Code available for payment source | Admitting: Family Medicine

## 2012-01-31 VITALS — BP 125/80 | Wt 240.9 lb

## 2012-01-31 DIAGNOSIS — O368190 Decreased fetal movements, unspecified trimester, not applicable or unspecified: Secondary | ICD-10-CM

## 2012-01-31 NOTE — Patient Instructions (Signed)
Come back to see me in clinic next Friday. I will not be back until Thursday, so please do not have the baby early!  Merry Christmas,   Dr. Clinton Sawyer

## 2012-01-31 NOTE — Progress Notes (Signed)
Patient denies any regular contractions, vaginal bleeding, or rupture of membranes/leakage of fluids. She does note decreased fetal movement with a maximum of 3 times and hour with very little movement noticed overnight.  31 year old F [id number] @ [redacted]w[redacted]d who I will refer to [facility] for NST to eval decreased fetal movement.  - f/u 1 week if not in labor - schedule induction at 41w if necessary

## 2012-02-04 ENCOUNTER — Inpatient Hospital Stay (HOSPITAL_COMMUNITY)
Admission: AD | Admit: 2012-02-04 | Discharge: 2012-02-04 | Disposition: A | Discharge status: Home / Self Care | Payer: Self-pay | Source: Ambulatory Visit | Attending: Obstetrics & Gynecology | Admitting: Obstetrics & Gynecology

## 2012-02-04 ENCOUNTER — Encounter (HOSPITAL_COMMUNITY): Payer: Self-pay | Admitting: Obstetrics and Gynecology

## 2012-02-04 ENCOUNTER — Inpatient Hospital Stay (HOSPITAL_COMMUNITY): Payer: Self-pay

## 2012-02-04 DIAGNOSIS — Z3689 Encounter for other specified antenatal screening: Secondary | ICD-10-CM

## 2012-02-04 DIAGNOSIS — O368190 Decreased fetal movements, unspecified trimester, not applicable or unspecified: Secondary | ICD-10-CM | POA: Insufficient documentation

## 2012-02-04 MED ORDER — GI COCKTAIL ~~LOC~~
30.0000 mL | Freq: Once | ORAL | Status: AC
  Administered 2012-02-04: 30 mL via ORAL
  Filled 2012-02-04: qty 30

## 2012-02-04 NOTE — MAU Note (Signed)
Pt presents to MAU with complaints of decreased fetal movement. Pt says the last time she felt her baby move was Saturday morning. Pt is a G4P2 at [redacted]w[redacted]d

## 2012-02-04 NOTE — MAU Note (Signed)
Pt reports she has not felt baby move since yesterday. Pt reports she feels numbness in her abd and reports swelling in her hand and feet.Loss of appetite x3 days

## 2012-02-04 NOTE — Progress Notes (Signed)
Notified Drenda Freeze of patient presentation to MAU for decreased fetal movement with a non-reactive tracing.

## 2012-02-04 NOTE — Progress Notes (Signed)
Called Drenda Freeze to notify her of BPP 8/8. Drenda Freeze is a delivery right now and will return call when she is available.

## 2012-02-04 NOTE — ED Provider Notes (Signed)
First Provider Initiated Contact with Patient 02/04/12 1709      Chief Complaint:  Decreased Fetal Movement She also complains of having a pain in her middle abdomen, particularly after having rested for a while.  States she "tells her doctor and no one does anything about it"  Bonnie Mcneil is  31 y.o. [phone] at [redacted]w[redacted]d   Obstetrical/Gynecological History: Menstrual History: OB History    Grav Para Term Preterm Abortions TAB SAB Ect Mult Living   4 2 2  0 1 1 0 0 0 2       Patient's last menstrual period was 05/04/2011.     Past Medical History: Past Medical History  Diagnosis Date  . Cholecystitis 2008  . Intrauterine pregnancy 2004, 2008  . Therapeutic abortion in first trimester 2008    secondary to concern for baby during cholecystecomy  . Chlamydia 2004    Past Surgical History: Past Surgical History  Procedure Date  . Cholecystectomy 2008    Family History: Family History  Problem Relation Age of Onset  . Anesthesia problems Neg Hx   . Hypotension Neg Hx   . Malignant hyperthermia Neg Hx   . Pseudochol deficiency Neg Hx     Social History: History  Substance Use Topics  . Smoking status: Never Smoker   . Smokeless tobacco: Never Used  . Alcohol Use: No    Allergies: No Known Allergies  Meds:  Prescriptions prior to admission  Medication Sig Dispense Refill  . calcium carbonate (TUMS - DOSED IN MG ELEMENTAL CALCIUM) 500 MG chewable tablet Chew 1 tablet by mouth daily as needed. For heartburn      . Prenatal Vit-Fe Fumarate-FA (PRENATAL MULTIVITAMIN) TABS Take 1 tablet by mouth daily.      . ranitidine (ZANTAC) 150 MG tablet Take 1 tablet (150 mg total) by mouth 2 (two) times daily.  60 tablet  1    Review of Systems - Please refer to the aforementioned patients' reports.     Physical Exam  Blood pressure 111/71, pulse 118, temperature 98.2 F (36.8 C), temperature source Oral, resp. rate 18, height 5\' 7"  (1.702 m), weight 111.585 kg (246  lb), last menstrual period 05/04/2011. GENERAL: Well-developed, well-nourished female in no acute distress.  LUNGS: Clear to auscultation bilaterally.  HEART: Regular rate and rhythm. ABDOMEN: Soft, nontender, nondistended, gravid. Non tender in area of pain EXTREMITIES: Nontender, no edema, 2+ distal pulses. FHT:  Baseline rate 150 bpm   Variability minimal to moderate  Accelerations present but infrequent   Decelerations none Contractions: Every 0 mins   Labs: No results found for this or any previous visit (from the past 24 hour(s)). Imaging Studies:  US Ob Limited Assessment: Bonnie Mcneil is  31 y.o. [phone] at [redacted]w[redacted]d presents with decreased fetal movement.  Plan: BPP/r/o any placental reasons for pain  Addendum:  GI cocktail did not change abdominal discomfort.  BPP 8/8; no evidence of placental abruption.  Pt reassured.  CRESENZO-DISHMAN,[name] 12/22/20135:19 PM

## 2012-02-09 ENCOUNTER — Ambulatory Visit (INDEPENDENT_AMBULATORY_CARE_PROVIDER_SITE_OTHER): Payer: No Typology Code available for payment source | Admitting: Family Medicine

## 2012-02-09 VITALS — BP 126/81 | Wt 247.0 lb

## 2012-02-09 DIAGNOSIS — Z3490 Encounter for supervision of normal pregnancy, unspecified, unspecified trimester: Secondary | ICD-10-CM

## 2012-02-09 DIAGNOSIS — Z3480 Encounter for supervision of other normal pregnancy, unspecified trimester: Secondary | ICD-10-CM

## 2012-02-09 NOTE — Progress Notes (Signed)
31 y.o. [id number] @ [redacted]w[redacted]d for routine pre-natal visit. She was seen in the MAU on 12/22 for decreased fetal movement. At that time, an NST was non-reactive so a BPP was performed which was an 8/8. Since that time, she denies contractions, but is concerned about superior abdominal pain. It is intermittent. No bleeding or leakage of fluids. No contractions. Feels fetal movement.   A/P: Patient to be scheduled for induction of labor on Tuesday 02/13/12. Will present to MAU if regular contraction, leakage of fluid or other acute concerns.

## 2012-02-10 ENCOUNTER — Encounter (HOSPITAL_COMMUNITY): Payer: Self-pay | Admitting: Family

## 2012-02-10 ENCOUNTER — Inpatient Hospital Stay (HOSPITAL_COMMUNITY)
Admission: AD | Admit: 2012-02-10 | Discharge: 2012-02-10 | Disposition: A | Discharge status: Home / Self Care | Payer: No Typology Code available for payment source | Source: Ambulatory Visit | Attending: Family Medicine | Admitting: Family Medicine

## 2012-02-10 DIAGNOSIS — N949 Unspecified condition associated with female genital organs and menstrual cycle: Secondary | ICD-10-CM | POA: Insufficient documentation

## 2012-02-10 DIAGNOSIS — O99891 Other specified diseases and conditions complicating pregnancy: Secondary | ICD-10-CM | POA: Insufficient documentation

## 2012-02-10 DIAGNOSIS — R109 Unspecified abdominal pain: Secondary | ICD-10-CM | POA: Insufficient documentation

## 2012-02-10 NOTE — MAU Note (Addendum)
Patient reports increased vaginal discharge today and intermittent lower abdominal pain since Friday. Reports fetal movement this morning but has been on her feet several hours shopping and has not felt baby move since this morning.

## 2012-02-10 NOTE — Progress Notes (Signed)
Written and verbal d/c instructions given and understanding voiced. 

## 2012-02-12 ENCOUNTER — Telehealth (HOSPITAL_COMMUNITY): Payer: Self-pay | Admitting: *Deleted

## 2012-02-12 NOTE — Telephone Encounter (Signed)
Preadmission screen Interpreter number [phone]

## 2012-02-12 NOTE — Telephone Encounter (Signed)
Preadmission screen  

## 2012-02-13 ENCOUNTER — Encounter (HOSPITAL_COMMUNITY): Payer: Self-pay

## 2012-02-13 ENCOUNTER — Inpatient Hospital Stay (HOSPITAL_COMMUNITY)
Admission: RE | Admit: 2012-02-13 | Discharge: 2012-02-15 | DRG: 775 | Disposition: A | Discharge status: Home / Self Care | Payer: Medicaid Other | Source: Ambulatory Visit | Attending: Obstetrics & Gynecology | Admitting: Obstetrics & Gynecology

## 2012-02-13 DIAGNOSIS — O480 Post-term pregnancy: Principal | ICD-10-CM | POA: Diagnosis present

## 2012-02-13 LAB — CBC
Hemoglobin: 12.2 g/dL (ref 12.0–15.0)
RBC: 4.22 MIL/uL (ref 3.87–5.11)
WBC: 14.8 10*3/uL — ABNORMAL HIGH (ref 4.0–10.5)

## 2012-02-13 LAB — RPR: RPR Ser Ql: NONREACTIVE

## 2012-02-13 MED ORDER — TERBUTALINE SULFATE 1 MG/ML IJ SOLN: 0.2500 mg | Freq: Once | INTRAMUSCULAR | Status: AC | PRN

## 2012-02-13 MED ORDER — ACETAMINOPHEN 325 MG PO TABS: 650.0000 mg | ORAL_TABLET | ORAL | Status: DC | PRN

## 2012-02-13 MED ORDER — ONDANSETRON HCL 4 MG/2ML IJ SOLN: 4.0000 mg | Freq: Four times a day (QID) | INTRAMUSCULAR | Status: DC | PRN

## 2012-02-13 MED ORDER — OXYCODONE-ACETAMINOPHEN 5-325 MG PO TABS: 1.0000 | ORAL_TABLET | ORAL | Status: DC | PRN

## 2012-02-13 MED ORDER — LIDOCAINE HCL (PF) 1 % IJ SOLN
30.0000 mL | INTRAMUSCULAR | Status: DC | PRN
  Filled 2012-02-13: qty 30

## 2012-02-13 MED ORDER — CITRIC ACID-SODIUM CITRATE 334-500 MG/5ML PO SOLN: 30.0000 mL | ORAL | Status: DC | PRN

## 2012-02-13 MED ORDER — MISOPROSTOL 25 MCG QUARTER TABLET
25.0000 ug | ORAL_TABLET | ORAL | Status: DC | PRN
  Administered 2012-02-13 (×3): 25 ug via VAGINAL
  Filled 2012-02-13 (×3): qty 0.25

## 2012-02-13 MED ORDER — LACTATED RINGERS IV SOLN: 500.0000 mL | INTRAVENOUS | Status: DC | PRN

## 2012-02-13 MED ORDER — OXYTOCIN BOLUS FROM INFUSION
500.0000 mL | INTRAVENOUS | Status: DC
  Administered 2012-02-14: 500 mL via INTRAVENOUS

## 2012-02-13 MED ORDER — IBUPROFEN 600 MG PO TABS: 600.0000 mg | ORAL_TABLET | Freq: Four times a day (QID) | ORAL | Status: DC | PRN

## 2012-02-13 MED ORDER — OXYTOCIN 40 UNITS IN LACTATED RINGERS INFUSION - SIMPLE MED
62.5000 mL/h | INTRAVENOUS | Status: DC
  Administered 2012-02-14: 62.5 mL/h via INTRAVENOUS

## 2012-02-13 MED ORDER — LACTATED RINGERS IV SOLN
INTRAVENOUS | Status: DC
  Administered 2012-02-13: 125 mL via INTRAVENOUS

## 2012-02-13 NOTE — Progress Notes (Signed)
Bonnie Mcneil, CNM stated Dr. Thad Ranger would assess pt and decide POC for the evening.

## 2012-02-13 NOTE — Progress Notes (Signed)
Patient ID: Bonnie Mcneil, female   DOB: 1980/12/06, 32 y.o.   MRN: [id number]   S:  Pt feeling ctx for past 2 hours.   O:   Filed Vitals:   02/13/12 1940  BP: 120/80  Pulse: 94  Temp: 98.2 F (36.8 C)  Resp: 18    Cervix:  FT/long/high  FHTs  150, mod var, accels present, no decels   A/P Attempted to place foley bulb unsuccessfully. Will give another dose of cytotec.  Napoleon Form, MD 02/13/2012 8:54 PM

## 2012-02-13 NOTE — H&P (Signed)
Reviewed dating criteria and agree with induction as per the note

## 2012-02-13 NOTE — Progress Notes (Signed)
Monitors off. Pt to walk x 30 min

## 2012-02-13 NOTE — H&P (Signed)
Bonnie Mcneil is a 31 y.o. female @ [redacted]w[redacted]d via LMP presenting for induction of labor. She notes minimal irregular contractions. She denies leakage of fluids or vaginal bleeding. She is uncertain of anesthesia at this time.   Maternal Medical History:  Contractions: Frequency: irregular.    Fetal activity: Perceived fetal activity is normal.   Last perceived fetal movement was within the past 12 hours.    Prenatal complications: no prenatal complications   OB History    Grav Para Term Preterm Abortions TAB SAB Ect Mult Living   4 2 2  0 1 1 0 0 0 2     Past Medical History  Diagnosis Date  . Cholecystitis 2008  . Intrauterine pregnancy 2004, 2008  . Therapeutic abortion in first trimester 2008    secondary to concern for baby during cholecystecomy  . Chlamydia 2004   Past Surgical History  Procedure Date  . Cholecystectomy 2008   Family History: family history is negative for Anesthesia problems, and Hypotension, and Malignant hyperthermia, and Pseudochol deficiency, . Social History:  reports that she has never smoked. She has never used smokeless tobacco. She reports that she does not drink alcohol or use illicit drugs.   Prenatal Transfer Tool  Maternal Diabetes: No Genetic Screening: Declined Maternal Ultrasounds/Referrals: Normal Fetal Ultrasounds or other Referrals:  None Maternal Substance Abuse:  No Significant Maternal Medications:  None Significant Maternal Lab Results:  None Other Comments:  None  Review of Systems  All other systems reviewed and are negative.     Dilation: Fingertip Effacement (%): Thick Station: -3 Exam by:: Dr. Clinton Sawyer  Blood pressure 108/69, pulse 79, temperature 97.6 F (36.4 C), temperature source Oral, resp. rate 18, height 5\' 7"  (1.702 m), weight 111.585 kg (246 lb), last menstrual period 05/04/2011.  Maternal Exam:  Uterine Assessment: Contraction frequency is irregular.   Abdomen: Patient reports no abdominal  tenderness. Fundal height is 42.   Fetal presentation: vertex  Introitus: Normal vulva. Normal vagina.  Amniotic fluid character: not assessed.  Pelvis: adequate for delivery.   Cervix: Cervix evaluated by digital exam.     Fetal Exam Fetal Monitor Review: Mode: fetoscope.   Baseline rate: 140.  Variability: minimal (<5 bpm).   Pattern: no accelerations and no decelerations.    Fetal State Assessment: Category II - tracings are indeterminate.     Physical Exam  Constitutional: She is oriented to person, place, and time. She appears well-developed and well-nourished. No distress.  HENT:  Head: Normocephalic and atraumatic.  Eyes: Conjunctivae normal and EOM are normal. Pupils are equal, round, and reactive to light.  Neck: Normal range of motion. Neck supple.  Cardiovascular: Normal rate, regular rhythm and normal heart sounds.   Respiratory: Effort normal and breath sounds normal.  GI: Soft. Bowel sounds are normal.       Gravid  Genitourinary: Vagina normal and uterus normal.  Musculoskeletal: She exhibits no edema.  Neurological: She is alert and oriented to person, place, and time. She has normal reflexes.  Skin: Skin is warm and dry. She is not diaphoretic.  Psychiatric: She has a normal mood and affect. Her behavior is normal. Judgment and thought content normal.      Prenatal labs: ABO, Rh: O/POS/-- (05/28 1020) Antibody: NEG (05/28 1020) Rubella: >500.0 (05/28 1020) RPR: NON REAC (10/03 1048)  HBsAg: NEGATIVE (05/28 1020)  HIV: NON REACTIVE (10/03 1048)  GBS: NEGATIVE (11/27 1341)   Assessment/Plan: 31 year old [id number] @ [redacted]w[redacted]d from LMP who presented  for IOL.  - Cervix with minimal dilation so start with cytotec 25 mcg q 4 hours for ripening - Fetoscope and tocometer per protocol - Anesthesia: considering eipdural, but not wanted at this time - Diet: clear liquids - IVF - LR @ 125 mL.hr - Up ad lib  Si Raider. Clinton Sawyer, MD, MBA 02/13/2012, 11:12  AM Family Medicine Resident, PGY-2 [phone] pager

## 2012-02-13 NOTE — Progress Notes (Signed)
Bonnie Mcneil is a 31 y.o. [id number] at [redacted]w[redacted]d by LMP admitted for induction of labor due to Elective at term.  Subjective: No complaints; denies back pain  Objective: BP 111/57  Pulse 82  Temp 97.6 F (36.4 C) (Oral)  Resp 18  Ht 5\' 7"  (1.702 m)  Wt 111.585 kg (246 lb)  BMI 38.53 kg/m2  LMP 05/04/2011      FHT:  FHR: 120 bpm, variability: marked,  accelerations:  Present,  decelerations:  Absent UC:   none SVE:   Dilation: Fingertip Effacement (%): Thick Station: -3 Exam by:: Dr. Clinton Sawyer   Labs: Lab Results  Component Value Date   WBC 14.8* 02/13/2012   HGB 12.2 02/13/2012   HCT 36.4 02/13/2012   MCV 86.3 02/13/2012   PLT 217 02/13/2012    Assessment / Plan: IOL for late term with patient preference  Labor: not started yet; still ripening cervix Preeclampsia:  n/a Fetal Wellbeing:  Category I Pain Control:  Labor support without medications I/D:  n/a Anticipated MOD:  NSVD  Si Raider. Clinton Sawyer, MD, MBA 02/13/2012, 2:54 PM Family Medicine Resident, PGY-2 [phone] pager  Evaluation and management procedures were performed by Resident physician under my supervision/collaboration. Chart reviewed, patient examined by me and I agree with management and plan. Danae Orleans, CNM 02/14/2012 1:31 PM

## 2012-02-14 ENCOUNTER — Inpatient Hospital Stay (HOSPITAL_COMMUNITY): Payer: Medicaid Other | Admitting: Anesthesiology

## 2012-02-14 ENCOUNTER — Encounter (HOSPITAL_COMMUNITY): Payer: Self-pay | Admitting: Anesthesiology

## 2012-02-14 ENCOUNTER — Encounter (HOSPITAL_COMMUNITY): Payer: Self-pay

## 2012-02-14 DIAGNOSIS — O480 Post-term pregnancy: Secondary | ICD-10-CM

## 2012-02-14 LAB — PREPARE RBC (CROSSMATCH)

## 2012-02-14 MED ORDER — PHENYLEPHRINE 40 MCG/ML (10ML) SYRINGE FOR IV PUSH (FOR BLOOD PRESSURE SUPPORT)
80.0000 ug | PREFILLED_SYRINGE | INTRAVENOUS | Status: DC | PRN
  Filled 2012-02-14: qty 5

## 2012-02-14 MED ORDER — FENTANYL 2.5 MCG/ML BUPIVACAINE 1/10 % EPIDURAL INFUSION (WH - ANES)
INTRAMUSCULAR | Status: DC | PRN
  Administered 2012-02-14: 14 mL/h via EPIDURAL

## 2012-02-14 MED ORDER — LACTATED RINGERS IV SOLN
500.0000 mL | Freq: Once | INTRAVENOUS | Status: AC
  Administered 2012-02-14: 500 mL via INTRAVENOUS

## 2012-02-14 MED ORDER — TERBUTALINE SULFATE 1 MG/ML IJ SOLN: 0.2500 mg | Freq: Once | INTRAMUSCULAR | Status: DC | PRN

## 2012-02-14 MED ORDER — TETANUS-DIPHTH-ACELL PERTUSSIS 5-2.5-18.5 LF-MCG/0.5 IM SUSP: 0.5000 mL | Freq: Once | INTRAMUSCULAR | Status: DC

## 2012-02-14 MED ORDER — PRENATAL MULTIVITAMIN CH
1.0000 | ORAL_TABLET | Freq: Every day | ORAL | Status: DC
  Administered 2012-02-14 – 2012-02-15 (×2): 1 via ORAL
  Filled 2012-02-14 (×2): qty 1

## 2012-02-14 MED ORDER — DIPHENHYDRAMINE HCL 50 MG/ML IJ SOLN: 12.5000 mg | INTRAMUSCULAR | Status: DC | PRN

## 2012-02-14 MED ORDER — EPHEDRINE 5 MG/ML INJ
10.0000 mg | INTRAVENOUS | Status: DC | PRN
  Filled 2012-02-14: qty 4

## 2012-02-14 MED ORDER — PHENYLEPHRINE 40 MCG/ML (10ML) SYRINGE FOR IV PUSH (FOR BLOOD PRESSURE SUPPORT): 80.0000 ug | PREFILLED_SYRINGE | INTRAVENOUS | Status: DC | PRN

## 2012-02-14 MED ORDER — OXYTOCIN 40 UNITS IN LACTATED RINGERS INFUSION - SIMPLE MED
1.0000 m[IU]/min | INTRAVENOUS | Status: DC
  Administered 2012-02-14: 1 m[IU]/min via INTRAVENOUS
  Filled 2012-02-14: qty 1000

## 2012-02-14 MED ORDER — EPHEDRINE 5 MG/ML INJ: 10.0000 mg | INTRAVENOUS | Status: DC | PRN

## 2012-02-14 MED ORDER — BENZOCAINE-MENTHOL 20-0.5 % EX AERO
1.0000 "application " | INHALATION_SPRAY | CUTANEOUS | Status: DC | PRN
  Administered 2012-02-14: 1 via TOPICAL
  Filled 2012-02-14: qty 56

## 2012-02-14 MED ORDER — SENNOSIDES-DOCUSATE SODIUM 8.6-50 MG PO TABS
2.0000 | ORAL_TABLET | Freq: Every day | ORAL | Status: DC
  Administered 2012-02-14: 2 via ORAL

## 2012-02-14 MED ORDER — LANOLIN HYDROUS EX OINT: TOPICAL_OINTMENT | CUTANEOUS | Status: DC | PRN

## 2012-02-14 MED ORDER — OXYCODONE-ACETAMINOPHEN 5-325 MG PO TABS: 1.0000 | ORAL_TABLET | ORAL | Status: DC | PRN

## 2012-02-14 MED ORDER — WITCH HAZEL-GLYCERIN EX PADS: 1.0000 "application " | MEDICATED_PAD | CUTANEOUS | Status: DC | PRN

## 2012-02-14 MED ORDER — LIDOCAINE HCL (PF) 1 % IJ SOLN
INTRAMUSCULAR | Status: DC | PRN
  Administered 2012-02-14 (×2): 4 mL

## 2012-02-14 MED ORDER — DIPHENHYDRAMINE HCL 25 MG PO CAPS: 25.0000 mg | ORAL_CAPSULE | Freq: Four times a day (QID) | ORAL | Status: DC | PRN

## 2012-02-14 MED ORDER — DIBUCAINE 1 % RE OINT: 1.0000 "application " | TOPICAL_OINTMENT | RECTAL | Status: DC | PRN

## 2012-02-14 MED ORDER — ONDANSETRON HCL 4 MG/2ML IJ SOLN: 4.0000 mg | INTRAMUSCULAR | Status: DC | PRN

## 2012-02-14 MED ORDER — SIMETHICONE 80 MG PO CHEW: 80.0000 mg | CHEWABLE_TABLET | ORAL | Status: DC | PRN

## 2012-02-14 MED ORDER — IBUPROFEN 600 MG PO TABS
600.0000 mg | ORAL_TABLET | Freq: Four times a day (QID) | ORAL | Status: DC
  Administered 2012-02-14 – 2012-02-15 (×5): 600 mg via ORAL
  Filled 2012-02-14 (×5): qty 1

## 2012-02-14 MED ORDER — ZOLPIDEM TARTRATE 5 MG PO TABS: 5.0000 mg | ORAL_TABLET | Freq: Every evening | ORAL | Status: DC | PRN

## 2012-02-14 MED ORDER — ONDANSETRON HCL 4 MG PO TABS: 4.0000 mg | ORAL_TABLET | ORAL | Status: DC | PRN

## 2012-02-14 MED ORDER — FENTANYL 2.5 MCG/ML BUPIVACAINE 1/10 % EPIDURAL INFUSION (WH - ANES)
14.0000 mL/h | INTRAMUSCULAR | Status: DC
  Filled 2012-02-14: qty 125

## 2012-02-14 NOTE — Anesthesia Preprocedure Evaluation (Addendum)
Anesthesia Evaluation  Patient identified by MRN, date of birth, ID band Patient awake    Reviewed: Allergy & Precautions, H&P , Patient's Chart, lab work & pertinent test results  Airway Mallampati: III TM Distance: >3 FB Neck ROM: full    Dental No notable dental hx. (+) Teeth Intact   Pulmonary neg pulmonary ROS,  breath sounds clear to auscultation  Pulmonary exam normal       Cardiovascular negative cardio ROS  Rhythm:regular Rate:Normal     Neuro/Psych negative neurological ROS  negative psych ROS   GI/Hepatic Neg liver ROS, GERD-  Medicated and Controlled,  Endo/Other  Morbid obesity  Renal/GU negative Renal ROS  negative genitourinary   Musculoskeletal   Abdominal Normal abdominal exam  (+)   Peds  Hematology negative hematology ROS (+)   Anesthesia Other Findings   Reproductive/Obstetrics (+) Pregnancy                           Anesthesia Physical Anesthesia Plan  ASA: III  Anesthesia Plan: Epidural   Post-op Pain Management:    Induction:   Airway Management Planned:   Additional Equipment:   Intra-op Plan:   Post-operative Plan:   Informed Consent: I have reviewed the patients History and Physical, chart, labs and discussed the procedure including the risks, benefits and alternatives for the proposed anesthesia with the patient or authorized representative who has indicated his/her understanding and acceptance.     Plan Discussed with: Anesthesiologist  Anesthesia Plan Comments:        Anesthesia Quick Evaluation

## 2012-02-14 NOTE — L&D Delivery Note (Signed)
Delivery Note At 10:25 AM a viable female was delivered via Vaginal, Spontaneous Delivery (Presentation: Left Occiput Anterior).  APGAR: 8, 9; weight .   Placenta status: Intact, Spontaneous.  Cord: 3 vessels with the following complications: None.    Anesthesia: Epidural  Episiotomy: None Lacerations: 1st degree;Perineal Suture Repair: 3.0 vicryl Est. Blood Loss (mL): 300  Mom to postpartum.  Baby to nursery-stable.  Si Raider Clinton Sawyer, MD, MBA 02/14/2012, 11:19 AM Family Medicine Resident, PGY-2 [phone] pager

## 2012-02-14 NOTE — Progress Notes (Signed)
Patient ID: Bonnie Mcneil, female   DOB: 05-03-1980, 32 y.o.   MRN: [id number]  S: Patient having mild discomfort, contractions irregular  O:  Filed Vitals:    02/14/12 0000  02/14/12 0030  02/14/12 0039  02/14/12 0102   BP:  106/60  110/66   103/65   Pulse:  72  72  89  73   Temp:       TempSrc:       Resp:  20  20   16    Height:       Weight:       SpO2:    98%     Cervix: 1.5/330-40/-3  FHTs: 130, mod var, accels present, occasional variable and early decel  TOCO: Ctx q 1-4    A/P  S/p 3 doses of cytotec  Foley bulb placed successfully  Will start low dose pitocin  Anticipate SVD    Napoleon Form, MD  02/14/2012 1:30 AM

## 2012-02-14 NOTE — Progress Notes (Signed)
Pt using bedpan. 

## 2012-02-14 NOTE — Progress Notes (Signed)
MD Attempted to place IFSE

## 2012-02-14 NOTE — Progress Notes (Signed)
Patient ID: JAMICE CARRENO, female   DOB: 02-Nov-1980, 32 y.o.   MRN: [id number]  S: Patient having discomfort with regular contractions  O:  Temp:  [97.6 F (36.4 C)-98.4 F (36.9 C)] 98 F (36.7 C) (01/01 0700) Pulse Rate:  [70-94] 71  (01/01 0601) Resp:  [16-20] 18  (01/01 0700) BP: (94-125)/(48-80) 100/48 mmHg (01/01 0601) Weight:  [246 lb (111.585 kg)] 246 lb (111.585 kg) (12/31 8295)  Cervix: 6/80/-1  FHTs: 130, mod var, accels present, no decels - Cat I TOCO: Ctx q 1-4    A/P  S/p 3 doses of cytotec and foley bulb Cont low dose pitocin  Anticipate SVD  No pain med at this point   Si Raider. Clinton Sawyer, MD, MBA 02/14/2012, 7:39 AM Family Medicine Resident, PGY-2 [phone] pager

## 2012-02-14 NOTE — Progress Notes (Signed)
Beacon applied 

## 2012-02-14 NOTE — Progress Notes (Signed)
Pt up to bathroom.

## 2012-02-14 NOTE — L&D Delivery Note (Signed)
Precipitous delivery occurred prior to my arrival. I was present for the repair of the periurethral lac and agree with above.   [place], [place] 02/14/2012 12:13 PM

## 2012-02-14 NOTE — Progress Notes (Signed)
Patient ID: Bonnie Mcneil, female   DOB: [birth year], 32 y.o.   MRN: [id number]  S: s/p epidural 1 hour ago; Patient denies pain but senses contractions, starting to feel pressure in back and pelvis like needing to have a bowel movement  O:  Temp:  [98 F (36.7 C)-98.4 F (36.9 C)] 98.3 F (36.8 C) (01/01 1001) Pulse Rate:  [66-116] 91  (01/01 1001) Resp:  [16-20] 20  (01/01 1001) BP: (84-125)/(46-80) 103/46 mmHg (01/01 1001) SpO2:  [99 %-100 %] 99 % (01/01 0921)  Cervix: 9/10/0 FHTs: 130, mod var, accels present, variable decels, 1 late decel - Cat II tracing  TOCO: Ctx q 1-4 min   A/P  S/p 3 doses of cytotec and foley bulb Cont low dose pitocin  Epidural intact Consider amnioinfusion if more decels  Anticipate SVD     Si Raider. Clinton Sawyer, MD, MBA 02/14/2012, 10:11 AM Family Medicine Resident, PGY-2 [phone] pager

## 2012-02-14 NOTE — Progress Notes (Signed)
Dr Clinton Sawyer reviewed strip.

## 2012-02-14 NOTE — Anesthesia Procedure Notes (Signed)
Epidural Patient location during procedure: OB Start time: 02/14/2012 9:03 AM  Staffing Anesthesiologist: Brixon Zhen A. Performed by: anesthesiologist   Preanesthetic Checklist Completed: patient identified, site marked, surgical consent, pre-op evaluation, timeout performed, IV checked, risks and benefits discussed and monitors and equipment checked  Epidural Patient position: sitting Prep: site prepped and draped and DuraPrep Patient monitoring: continuous pulse ox and blood pressure Approach: midline Injection technique: LOR air  Needle:  Needle type: Tuohy  Needle gauge: 17 G Needle length: 9 cm and 9 Needle insertion depth: 6 cm Catheter type: closed end flexible Catheter size: 19 Gauge Catheter at skin depth: 11 cm Test dose: negative and Other  Assessment Events: blood not aspirated, injection not painful, no injection resistance, negative IV test and no paresthesia  Additional Notes Patient identified. Risks and benefits discussed including failed block, incomplete  Pain control, post dural puncture headache, nerve damage, paralysis, blood pressure Changes, nausea, vomiting, reactions to medications-both toxic and allergic and post Partum back pain. All questions were answered. Patient expressed understanding and wished to proceed. Sterile technique was used throughout procedure. Epidural site was Dressed with sterile barrier dressing. No paresthesias, signs of intravascular injection Or signs of intrathecal spread were encountered.  Patient was more comfortable after the epidural was dosed. Please see RN's note for documentation of vital signs and FHR which are stable.

## 2012-02-14 NOTE — Progress Notes (Signed)
Beacon reset

## 2012-02-14 NOTE — Progress Notes (Signed)
I was present for the exam and agree with above.  [place], [place] 02/14/2012 12:17 PM

## 2012-02-15 LAB — TYPE AND SCREEN
ABO/RH(D): O POS
Antibody Screen: NEGATIVE

## 2012-02-15 LAB — CBC
MCH: 29.8 pg (ref 26.0–34.0)
MCHC: 34.4 g/dL (ref 30.0–36.0)
MCV: 86.5 fL (ref 78.0–100.0)
Platelets: 182 10*3/uL (ref 150–400)
RBC: 3.86 MIL/uL — ABNORMAL LOW (ref 3.87–5.11)
RDW: 14.3 % (ref 11.5–15.5)

## 2012-02-15 NOTE — Plan of Care (Signed)
Problem: Discharge Progression Outcomes Goal: Activity appropriate for discharge plan Outcome: Completed/Met Date Met:  02/15/12 Pt. Completely independent

## 2012-02-15 NOTE — Discharge Summary (Signed)
Obstetric Discharge Summary Reason for Admission: induction of labor at [redacted]w[redacted]d Prenatal Procedures: NST and ultrasound Intrapartum Procedures: spontaneous vaginal delivery at [redacted]w[redacted]d Postpartum Procedures: none Complications-Operative and Postpartum: none Hemoglobin  Date Value Range Status  02/15/2012 11.5* 12.0 - 15.0 g/dL Final     HCT  Date Value Range Status  02/15/2012 33.4* 36.0 - 46.0 % Final    Physical Exam:  General: alert, cooperative, appears stated age and no distress Lochia: appropriate Uterine Fundus: firm Incision: none DVT Evaluation: No evidence of DVT seen on physical exam.  Discharge Diagnoses: Term Pregnancy-delivered  Discharge Information: Date: 02/15/2012 Activity: unrestricted Diet: routine Medications: None Condition: stable Instructions: refer to practice specific booklet Discharge to: home   Newborn Data: Live born female  Birth Weight: 8 lb 2 oz (3685 g) APGAR: 8, 9  Home with mother.  Si Raider Clinton Sawyer, MD, MBA 02/15/2012, 8:31 AM Family Medicine Resident, PGY-2 [phone] pager

## 2012-02-15 NOTE — Progress Notes (Signed)
UR chart review completed.  

## 2012-02-15 NOTE — Anesthesia Postprocedure Evaluation (Signed)
Anesthesia Post Note  Patient: Bonnie Mcneil  Procedure(s) Performed: * No procedures listed *  Anesthesia type: Epidural  Patient location: Mother/Baby  Post pain: Pain level controlled  Post assessment: Post-op Vital signs reviewed  Last Vitals:  Filed Vitals:   02/15/12 0525  BP: 97/59  Pulse: 80  Temp: 36.6 C  Resp: 20    Post vital signs: Reviewed  Level of consciousness:alert  Complications: No apparent anesthesia complications

## 2012-02-15 NOTE — Progress Notes (Signed)
I have seen and examined patient and reviewed FHTs and agree with above note. Napoleon Form, MD

## 2012-03-27 ENCOUNTER — Encounter: Payer: Self-pay | Admitting: Family Medicine

## 2012-03-27 ENCOUNTER — Ambulatory Visit (INDEPENDENT_AMBULATORY_CARE_PROVIDER_SITE_OTHER): Payer: No Typology Code available for payment source | Admitting: Family Medicine

## 2012-03-27 VITALS — BP 106/73 | HR 60 | Ht 67.0 in | Wt 222.0 lb

## 2012-03-27 DIAGNOSIS — Z309 Encounter for contraceptive management, unspecified: Secondary | ICD-10-CM

## 2012-03-27 DIAGNOSIS — IMO0001 Reserved for inherently not codable concepts without codable children: Secondary | ICD-10-CM

## 2012-03-27 MED ORDER — NORGESTIMATE-ETH ESTRADIOL 0.25-35 MG-MCG PO TABS: 1.0000 | ORAL_TABLET | Freq: Every day | ORAL | Status: DC

## 2012-03-27 NOTE — Patient Instructions (Signed)
Please start the birth control pill and take it at the same time each day. I recommend using condoms for at least 2 weeks while starting the medication. If you have any questions or concerns, then please let me know. Please follow up in 6 months.   Take Care,   Dr. Clinton Sawyer

## 2012-03-27 NOTE — Progress Notes (Signed)
  Subjective:    Patient ID: Bonnie Mcneil, female    DOB: 07/29/1980, 32 y.o.   MRN: [id number]  HPI  32 year old F who presents for 6 week post partum visit following uncomplicated vaginal delivery at 40w 6d of a healthy girl.   Patient denies depression, difficulty sleeping or emotional problems. She is not currently breastfeeding. She has not been sexually active yet and would like to start with contraception before. She has used OCP's successfully in the past and denies any history of blood clot.   Review of Systems     Objective:   Physical Exam BP 106/73  Pulse 60  Ht 5\' 7"  (1.702 m)  Wt 222 lb (100.699 kg)  BMI 34.76 kg/m2 Gen: obese, well appearing, pleasant and conversant GU: External: no lesions Vagina: no blood in vault Cervix: no lesion; no mucopurulent d/c Uterus: small, mobile Adnexa: no masses; non tender   PHQ9 - 1      Assessment & Plan:  Healthy and well appearing. Started on OCP today.

## 2012-12-08 ENCOUNTER — Encounter (HOSPITAL_COMMUNITY): Payer: Self-pay | Admitting: Emergency Medicine

## 2012-12-08 ENCOUNTER — Emergency Department (HOSPITAL_COMMUNITY): Payer: Self-pay

## 2012-12-08 ENCOUNTER — Emergency Department (HOSPITAL_COMMUNITY)
Admission: EM | Admit: 2012-12-08 | Discharge: 2012-12-08 | Disposition: A | Discharge status: Home / Self Care | Payer: Self-pay | Attending: Emergency Medicine | Admitting: Emergency Medicine

## 2012-12-08 DIAGNOSIS — Z79899 Other long term (current) drug therapy: Secondary | ICD-10-CM | POA: Insufficient documentation

## 2012-12-08 DIAGNOSIS — Z9089 Acquired absence of other organs: Secondary | ICD-10-CM | POA: Insufficient documentation

## 2012-12-08 DIAGNOSIS — N83209 Unspecified ovarian cyst, unspecified side: Secondary | ICD-10-CM | POA: Insufficient documentation

## 2012-12-08 DIAGNOSIS — Z3202 Encounter for pregnancy test, result negative: Secondary | ICD-10-CM | POA: Insufficient documentation

## 2012-12-08 DIAGNOSIS — Z8619 Personal history of other infectious and parasitic diseases: Secondary | ICD-10-CM | POA: Insufficient documentation

## 2012-12-08 LAB — COMPREHENSIVE METABOLIC PANEL
ALT: 18 U/L (ref 0–35)
Alkaline Phosphatase: 62 U/L (ref 39–117)
GFR calc Af Amer: >90 mL/min (ref >90)
Glucose, Bld: 101 mg/dL — ABNORMAL HIGH (ref 70–99)
Potassium: 3.6 mEq/L (ref 3.5–5.1)
Sodium: 135 mEq/L (ref 135–145)
Total Bilirubin: 0.7 mg/dL (ref 0.3–1.2)
Total Protein: 7.7 g/dL (ref 6.0–8.3)

## 2012-12-08 LAB — CBC WITH DIFFERENTIAL/PLATELET
Basophils Absolute: 0 10*3/uL (ref 0.0–0.1)
Eosinophils Absolute: 0.1 10*3/uL (ref 0.0–0.7)
Lymphocytes Relative: 16 % (ref 12–46)
Lymphs Abs: 2.1 10*3/uL (ref 0.7–4.0)
MCH: 30.4 pg (ref 26.0–34.0)
MCHC: 35.6 g/dL (ref 30.0–36.0)
MCV: 85.4 fL (ref 78.0–100.0)
Neutro Abs: 9.7 10*3/uL — ABNORMAL HIGH (ref 1.7–7.7)
Neutrophils Relative %: 74 % (ref 43–77)
Platelets: 269 10*3/uL (ref 150–400)
RBC: 4.18 MIL/uL (ref 3.87–5.11)
RDW: 12.5 % (ref 11.5–15.5)
WBC: 13.2 10*3/uL — ABNORMAL HIGH (ref 4.0–10.5)

## 2012-12-08 LAB — URINALYSIS, ROUTINE W REFLEX MICROSCOPIC
Bilirubin Urine: NEGATIVE
Hgb urine dipstick: NEGATIVE
Specific Gravity, Urine: 1.021 (ref 1.005–1.030)
Urobilinogen, UA: 0.2 mg/dL (ref 0.0–1.0)
pH: 6 (ref 5.0–8.0)

## 2012-12-08 LAB — WET PREP, GENITAL
Clue Cells Wet Prep HPF POC: NONE SEEN
Yeast Wet Prep HPF POC: NONE SEEN

## 2012-12-08 LAB — URINE MICROSCOPIC-ADD ON

## 2012-12-08 NOTE — ED Notes (Signed)
Patient transported to Ultrasound 

## 2012-12-08 NOTE — ED Notes (Signed)
Pelvic cart set up 

## 2012-12-08 NOTE — ED Provider Notes (Signed)
CSN: [id number]     Arrival date & time 12/08/12  0747 History   First MD Initiated Contact with Patient 12/08/12 [phone]     Chief Complaint  Patient presents with  . Abdominal Pain   (Consider location/radiation/quality/duration/timing/severity/associated sxs/prior Treatment) Patient is a 32 y.o. female presenting with abdominal pain.  Abdominal Pain Associated symptoms: no chest pain, no diarrhea, no dysuria, no fever, no hematuria, no nausea, no shortness of breath, no vaginal bleeding, no vaginal discharge and no vomiting    Patient is a 32 yo female with a history of cholecystitis s/p cholecystectomy who presents with left sided abdominal since Friday. Notes this started after coming home from the store. Notes no inciting incident. Describes as just pain, can't specify type. Notes it is constant. Notes has not improved and the area is tender. Notes some chills yesterday, but none today. Notes pain is not worsened by jostling movements. Denies dysuria, abnormal BMs, urinary difficulties, nausea, vomiting, vaginal discharge, or having this pain before. Notes her gall bladder has been removed. LMP 10/15. Has prior history of chlamydia.  Past Medical History  Diagnosis Date  . Cholecystitis 2008  . Intrauterine pregnancy 2004, 2008  . Therapeutic abortion in first trimester 2008    secondary to concern for baby during cholecystecomy  . Chlamydia 2004   Past Surgical History  Procedure Laterality Date  . Cholecystectomy  2008   Family History  Problem Relation Age of Onset  . Anesthesia problems Neg Hx   . Hypotension Neg Hx   . Malignant hyperthermia Neg Hx   . Pseudochol deficiency Neg Hx    History  Substance Use Topics  . Smoking status: Never Smoker   . Smokeless tobacco: Never Used  . Alcohol Use: No   OB History   Grav Para Term Preterm Abortions TAB SAB Ect Mult Living   4 3 3  0 1 1 0 0 0 3     Review of Systems  Constitutional: Negative for fever.  Respiratory:  Negative for shortness of breath.   Cardiovascular: Negative for chest pain.  Gastrointestinal: Positive for abdominal pain. Negative for nausea, vomiting, diarrhea and blood in stool.  Genitourinary: Negative for dysuria, hematuria, vaginal bleeding and vaginal discharge.    Allergies  Review of patient's allergies indicates no known allergies.  Home Medications   Current Outpatient Rx  Name  Route  Sig  Dispense  Refill  . norgestimate-ethinyl estradiol (ORTHO-CYCLEN,SPRINTEC,PREVIFEM) 0.25-35 MG-MCG tablet   Oral   Take 1 tablet by mouth daily.          BP 113/65  Pulse 92  Temp(Src) 98.1 F (36.7 C) (Oral)  Resp 18  SpO2 98% Physical Exam  Constitutional: She appears well-developed and well-nourished. No distress.  HENT:  Head: Normocephalic and atraumatic.  Mouth/Throat: Oropharynx is clear and moist.  Eyes: Pupils are equal, round, and reactive to light.  Cardiovascular: Normal rate, regular rhythm and normal heart sounds.   Pulmonary/Chest: Effort normal and breath sounds normal.  Abdominal: Soft. Bowel sounds are normal. She exhibits no distension and no mass. There is tenderness (left lateral abdomen and left lower quadrant). There is no rebound and no guarding.  Genitourinary:  External vagina normal, vaginal wall without lesions or erythema, small amount of white discharge, there is a clear transition point between external cervical tissue and endocervical tissue Bimanual with mild cervical tenderness and left adnexal tenderness, no masses palpated  Musculoskeletal: She exhibits no edema.  Neurological: She is alert.  Skin: Skin  is warm and dry.    ED Course  Procedures (including critical care time) Labs Review Labs Reviewed  WET PREP, GENITAL - Abnormal; Notable for the following:    WBC, Wet Prep HPF POC FEW (*)    All other components within normal limits  CBC WITH DIFFERENTIAL - Abnormal; Notable for the following:    WBC 13.2 (*)    HCT 35.7 (*)     Neutro Abs 9.7 (*)    Monocytes Absolute 1.2 (*)    All other components within normal limits  COMPREHENSIVE METABOLIC PANEL - Abnormal; Notable for the following:    Glucose, Bld 101 (*)    All other components within normal limits  URINALYSIS, ROUTINE W REFLEX MICROSCOPIC - Abnormal; Notable for the following:    APPearance CLOUDY (*)    Leukocytes, UA SMALL (*)    All other components within normal limits  URINE MICROSCOPIC-ADD ON - Abnormal; Notable for the following:    Squamous Epithelial / LPF MANY (*)    All other components within normal limits  GC/CHLAMYDIA PROBE AMP  URINE CULTURE  LIPASE, BLOOD  PREGNANCY, URINE  POCT PREGNANCY, URINE   Imaging Review US Transvaginal Non-ob  12/08/2012   CLINICAL DATA:  Pelvic pain  EXAM: PELVIC ULTRASOUND: TRANSABDOMINAL AND TRANSVAGINAL  TECHNIQUE: Study was performed transabdominally to optimize pelvic field of view evaluation and transvaginally to optimize internal visceral architecture evaluation.  COMPARISON:  None.  FINDINGS: Uterus is anteverted. Uterus measures 7.4 x 5.3 x 6.2 cm in size. The echotexture of the uterus is diffusely inhomogeneous consistent with leiomyomatous change. No well-defined dominant mass seen, however. Endometrium measures 5 mm in thickness with a smooth contour.  The right ovary measures 6.1 x 3.8 x 5.1 cm. Left ovary measures 2.6 x 1.4 x 1.7 cm. There is a cystic mass arising from the right ovary measuring 4.2 x 3.4 x 3.2 cm in size which contains a septation along the periphery. A 2nd cyst arising in left ovary measures 2.1 x 1.8 by 1.5 cm and may represent a follicle. There are no other extrauterine pelvic masses.  There is mild free fluid in the cul-de-sac.  IMPRESSION: Cystic right ovarian mass with septation. This mass most likely represents a cyst which has undergone mild hemorrhage. Differential considerations include endometrioma or possibly cystic ovarian neoplasm. Given that this is not a simple cyst,  a followup study in approximately 3 months may be advised.  Small amount of free fluid in the cul-de-sac. This finding may be physiologic but also may be indicative of recent ovarian cyst leakage.  The uterus has an inhomogeneous echotexture consistent leiomyomatous change. No well-defined uterine mass seen. Endometrium is not thickened.   Electronically Signed   By: Bretta Bang M.D.   On: 12/08/2012 12:28    EKG Interpretation   None       MDM   1. Ruptured ovarian cyst    9:55 am: patient seen and examined. Patient with left sided abdominal pain and LLQ pain since Friday. Not improving. Patient with normal lipase and LFTs, negative pregnancy test. Patient has elevated WBC, though this appears to have been elevated for the past year. No urinary symptoms. Awaiting UA. Will do pelvic exam and do cultures for GC/chlamydia. Differential for abdominal pain in a 32 yo female is broad and includes pancreatitis (unlikely given location of pain and normal lipase), appendicitis (unlikely given location of pain and non-toxic appears, though does have leukcytosis), ovarian torsion, colitis, ovarian cyst, PID.  10:45 am: pelvic exam performed revealing mild cervical motion tenderness and mild left adnexal tenderness. Mild amount of white discharge. Doubt this represents a PID given unilateral pain and only mild discomfort on exam. Will order pelvic ultrasound to evaluate for pelvic abnormality given exam findings.  12:50 pm: pelvic ultrasound with right sided ovarian cyst with mild hemorrhage and left sided ovarian cyst with small free fluid in the cul-de-sac indicating likely ovarian cyst rupture as cause of pain. Doubt this is a PID given unilateral pain in history and on exam. No other indications for cause of pain on exam or on lab work. Advised to use ibuprofen for pain. Discussed return precautions with the patient. Advised to follow-up with PCP.  This patient was discussed with my attending Dr  Radford Pax.  Marikay Alar, MD [facility] PGY-2 12/08/12 4:29 pm  Glori Luis, MD 12/08/12 [phone]

## 2012-12-08 NOTE — ED Notes (Signed)
Pt presents to department for evaluation of L sided abdominal pain. Ongoing since Friday. No nausea/vomiting. 5/10 pain that increases with movement. Last BM this morning was normal. LMP: 10/15. No signs of acute distress noted at the time.

## 2012-12-09 LAB — GC/CHLAMYDIA PROBE AMP
CT Probe RNA: NEGATIVE
GC Probe RNA: NEGATIVE

## 2012-12-13 NOTE — ED Provider Notes (Signed)
I saw and evaluated the patient, reviewed the resident's note and I agree with the findings and plan.   .Face to face Exam:  General:  Awake HEENT:  Atraumatic Resp:  Normal effort Abd:  Nondistended Neuro:No focal weakness  Nelia Shi, MD 12/13/12 1211

## 2013-07-18 ENCOUNTER — Encounter (HOSPITAL_COMMUNITY): Payer: Self-pay | Admitting: Emergency Medicine

## 2013-07-18 ENCOUNTER — Emergency Department (HOSPITAL_COMMUNITY)
Admission: EM | Admit: 2013-07-18 | Discharge: 2013-07-19 | Disposition: A | Discharge status: Home / Self Care | Payer: Self-pay | Attending: Emergency Medicine | Admitting: Emergency Medicine

## 2013-07-18 DIAGNOSIS — T6391XA Toxic effect of contact with unspecified venomous animal, accidental (unintentional), initial encounter: Secondary | ICD-10-CM | POA: Insufficient documentation

## 2013-07-18 DIAGNOSIS — Z8619 Personal history of other infectious and parasitic diseases: Secondary | ICD-10-CM | POA: Insufficient documentation

## 2013-07-18 DIAGNOSIS — Y939 Activity, unspecified: Secondary | ICD-10-CM | POA: Insufficient documentation

## 2013-07-18 DIAGNOSIS — T63461A Toxic effect of venom of wasps, accidental (unintentional), initial encounter: Secondary | ICD-10-CM | POA: Insufficient documentation

## 2013-07-18 DIAGNOSIS — Z8719 Personal history of other diseases of the digestive system: Secondary | ICD-10-CM | POA: Insufficient documentation

## 2013-07-18 DIAGNOSIS — Y929 Unspecified place or not applicable: Secondary | ICD-10-CM | POA: Insufficient documentation

## 2013-07-18 DIAGNOSIS — T63441A Toxic effect of venom of bees, accidental (unintentional), initial encounter: Secondary | ICD-10-CM

## 2013-07-18 DIAGNOSIS — Z79899 Other long term (current) drug therapy: Secondary | ICD-10-CM | POA: Insufficient documentation

## 2013-07-18 NOTE — ED Notes (Signed)
Pt reports being stung by ?bee this afternoon to L 2nd toe, swelling noted to all toes as swell as food into ankle.

## 2013-07-19 MED ORDER — DEXAMETHASONE SODIUM PHOSPHATE 10 MG/ML IJ SOLN
10.0000 mg | Freq: Once | INTRAMUSCULAR | Status: AC
  Administered 2013-07-19: 10 mg via INTRAMUSCULAR
  Filled 2013-07-19: qty 1

## 2013-07-19 MED ORDER — PREDNISONE 50 MG PO TABS: 50.0000 mg | ORAL_TABLET | Freq: Every day | ORAL | Status: DC

## 2013-07-19 NOTE — Discharge Instructions (Signed)
Take Benadryl for the reaction. return here as needed.  Ice and elevate the foot

## 2013-07-19 NOTE — ED Provider Notes (Signed)
CSN: [id number]     Arrival date & time 07/18/13  2325 History   First MD Initiated Contact with Patient 07/19/13 0000     Chief Complaint  Patient presents with  . Insect Bite     (Consider location/radiation/quality/duration/timing/severity/associated sxs/prior Treatment) HPI Patient presents to the emergency department with complaint of bee sting to the left second toe.  Patient, states, that her toe and foot started to swell afterwards.  Patient denies shortness of breath, wheezing, nausea, vomiting, fever, weakness, dizziness, or syncope.  The patient, states, that she didn't take some Tylenol for pain.  She states she did ice and elevate her foot.  Patient, states, that movement and palpation makes the discomfort worse. Past Medical History  Diagnosis Date  . Cholecystitis 2008  . Intrauterine pregnancy 2004, 2008  . Therapeutic abortion in first trimester 2008    secondary to concern for baby during cholecystecomy  . Chlamydia 2004   Past Surgical History  Procedure Laterality Date  . Cholecystectomy  2008   Family History  Problem Relation Age of Onset  . Anesthesia problems Neg Hx   . Hypotension Neg Hx   . Malignant hyperthermia Neg Hx   . Pseudochol deficiency Neg Hx    History  Substance Use Topics  . Smoking status: Never Smoker   . Smokeless tobacco: Never Used  . Alcohol Use: No   OB History   Grav Para Term Preterm Abortions TAB SAB Ect Mult Living   4 3 3  0 1 1 0 0 0 3     Review of Systems  All other systems negative except as documented in the HPI. All pertinent positives and negatives as reviewed in the HPI. Allergies  Review of patient's allergies indicates no known allergies.  Home Medications   Prior to Admission medications   Medication Sig Start Date End Date Taking? Authorizing Provider  norgestimate-ethinyl estradiol (ORTHO-CYCLEN,SPRINTEC,PREVIFEM) 0.25-35 MG-MCG tablet Take 1 tablet by mouth daily.    Historical Provider, MD   BP  104/60  Pulse 83  Temp(Src) 98.6 F (37 C) (Oral)  Resp 21  Ht 5\' 7"  (1.702 m)  Wt 225 lb (102.059 kg)  BMI 35.23 kg/m2  SpO2 97%  LMP 07/11/2013  Breastfeeding? No Physical Exam  Nursing note and vitals reviewed. Constitutional: She is oriented to person, place, and time. She appears well-developed and well-nourished. No distress.  HENT:  Head: Normocephalic and atraumatic.  Pulmonary/Chest: Effort normal.  Musculoskeletal:       Left foot: She exhibits tenderness and swelling. She exhibits normal range of motion, no bony tenderness, normal capillary refill, no crepitus and no deformity.       Feet:  Neurological: She is alert and oriented to person, place, and time.  Skin: Skin is warm and dry.    ED Course  Procedures (including critical care time) Patient be treated for a bee sting to the second toe of her left foot.  Patient is a rest return here for any worsening in her condition.  Told to use Tylenol and Motrin for pain and Benadryl for the reaction   Carlyle Dolly, PA-C 07/19/13 0014

## 2013-07-19 NOTE — ED Provider Notes (Signed)
Medical screening examination/treatment/procedure(s) were performed by non-physician practitioner and as supervising physician I was immediately available for consultation/collaboration.   EKG Interpretation None        Damyiah Moxley M Teona Vargus, MD 07/19/13 0741 

## 2013-12-15 ENCOUNTER — Encounter (HOSPITAL_COMMUNITY): Payer: Self-pay | Admitting: Emergency Medicine

## 2014-01-22 IMAGING — US US OB COMP LESS 14 WK
1 series · 14 of 28 positions shown · non-contrast
Comparison: None.

CLINICAL DATA: Pelvic pain, positive pregnancy test

OBSTETRIC <14 WK US AND TRANSVAGINAL OB US
TECHNIQUE: Both transabdominal and transvaginal ultrasound
examinations were performed for complete evaluation of the
gestation as well as the maternal uterus, adnexal regions, and
pelvic cul-de-sac.  Transvaginal technique was performed to assess
early pregnancy.

[Series 1: us ob comp less 14 wk · 0.26mm/px · 14 of 40 slices shown]
[im 2/40]
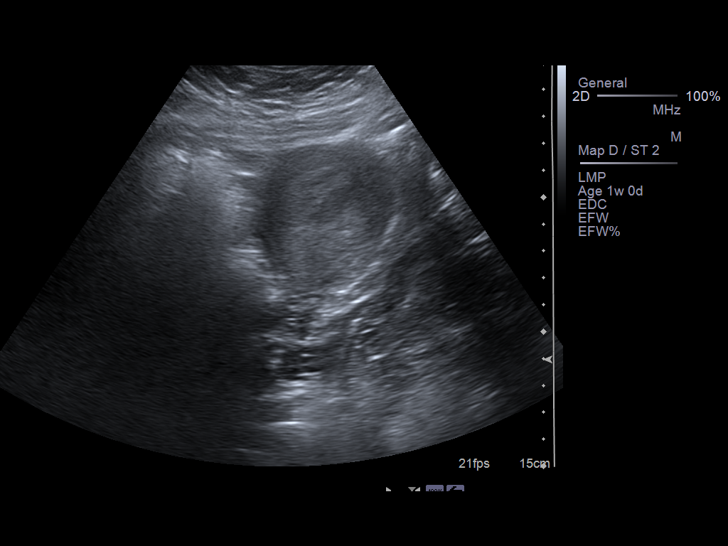
[im 5/40]
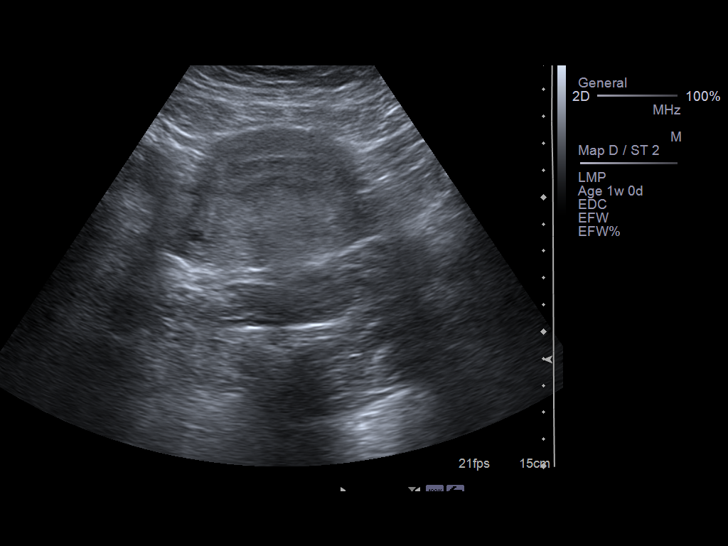
[im 8/40]
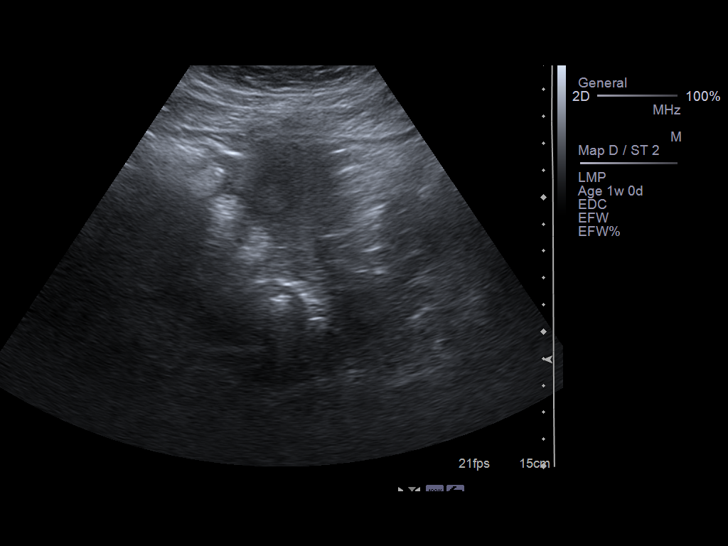
[im 11/40]
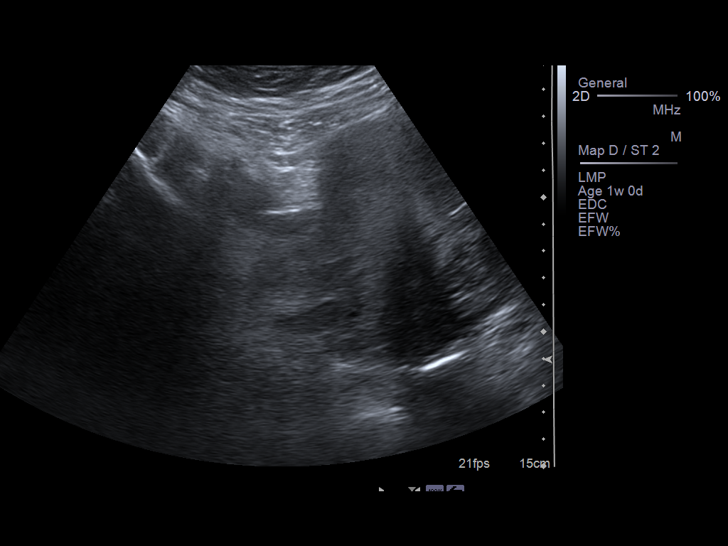
[im 14/40]
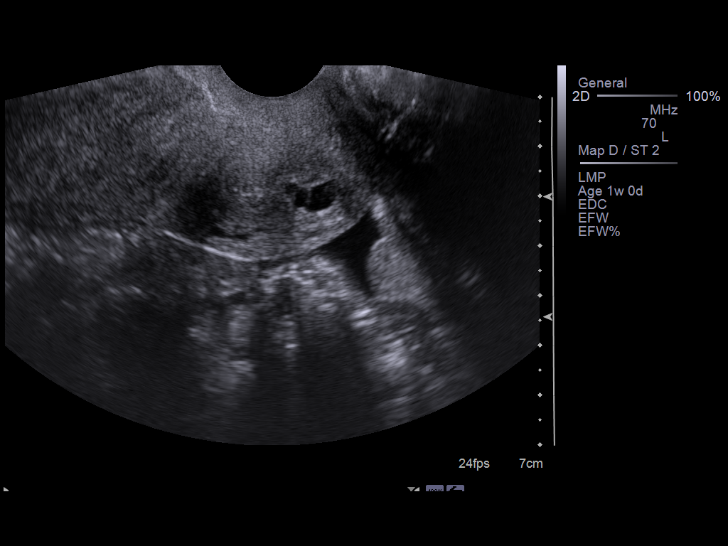
[im 16/40]
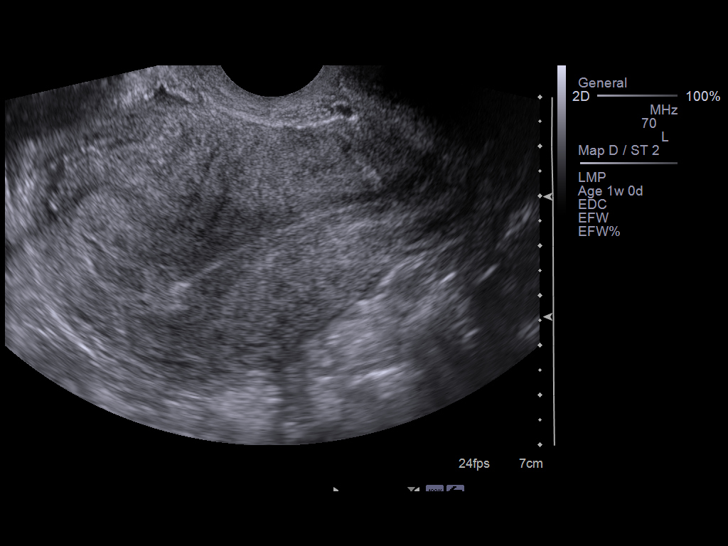
[im 19/40]
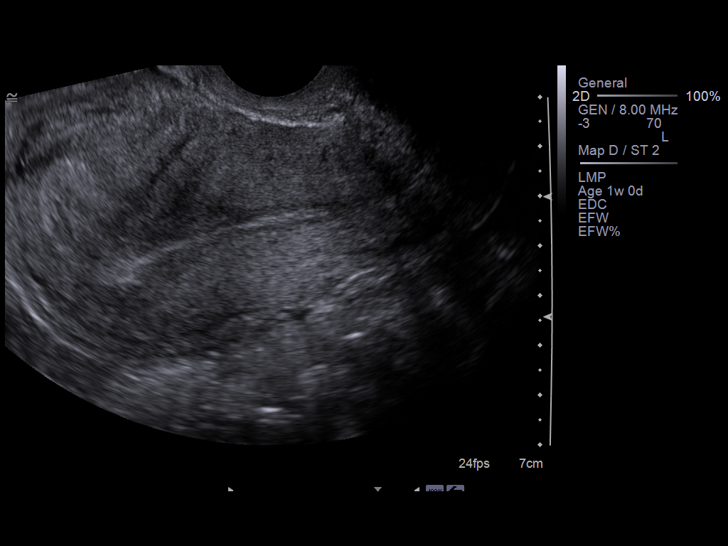
[im 22/40]
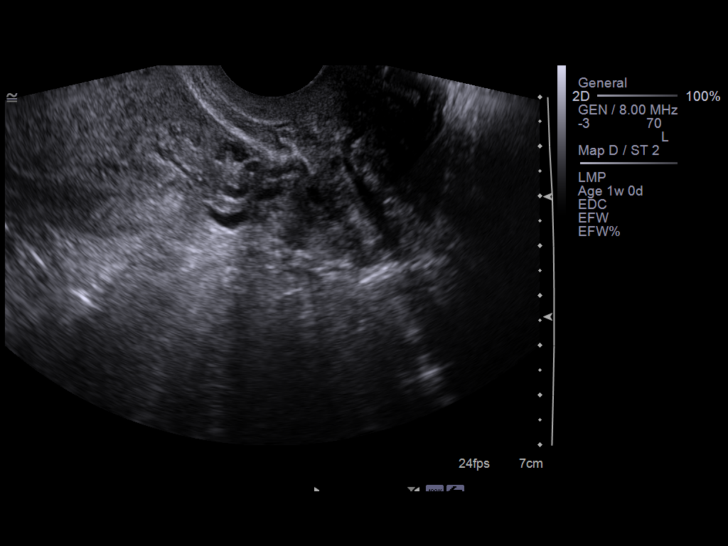
[im 25/40]
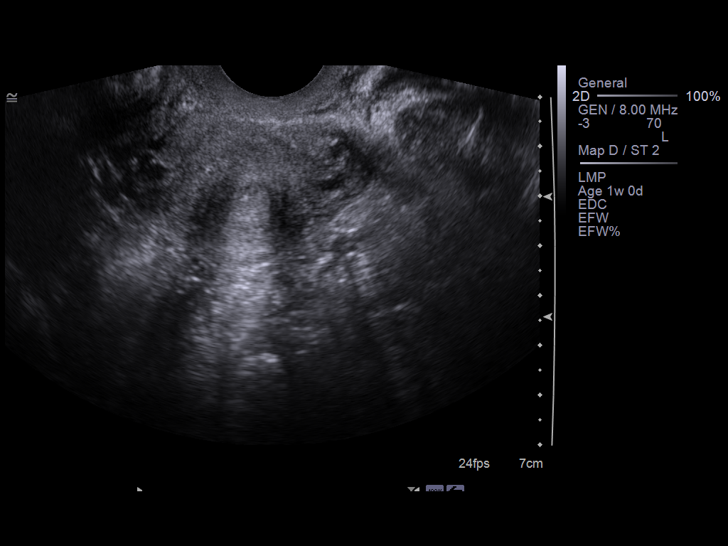
[im 28/40]
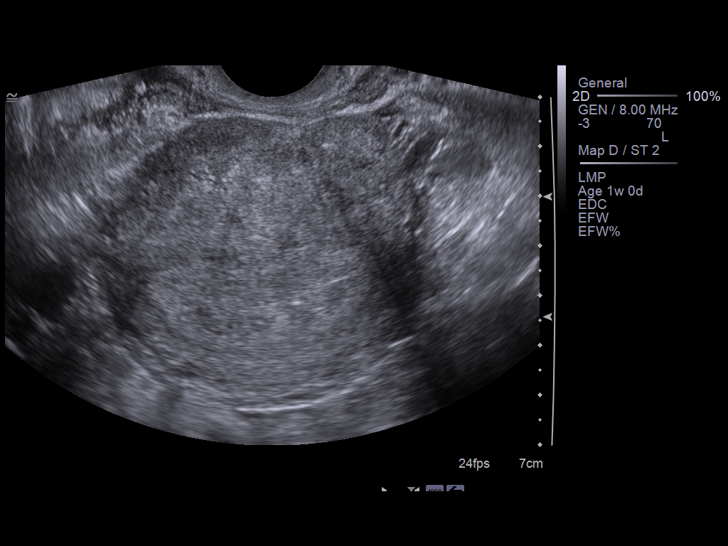
[im 31/40]
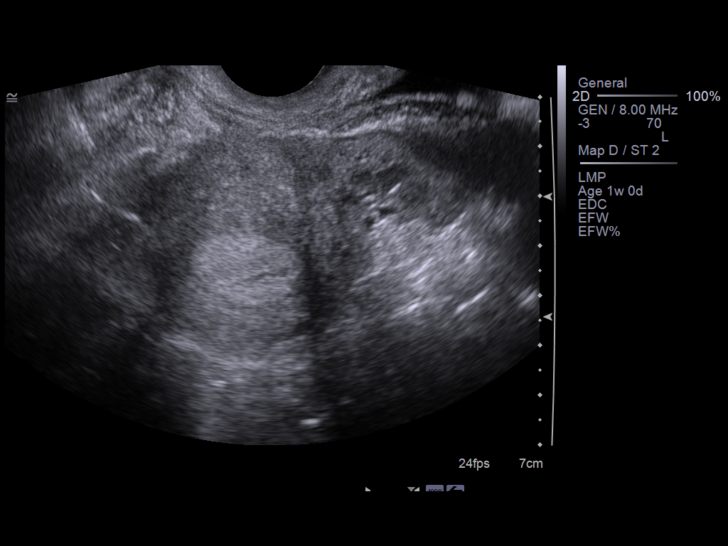
[im 34/40]
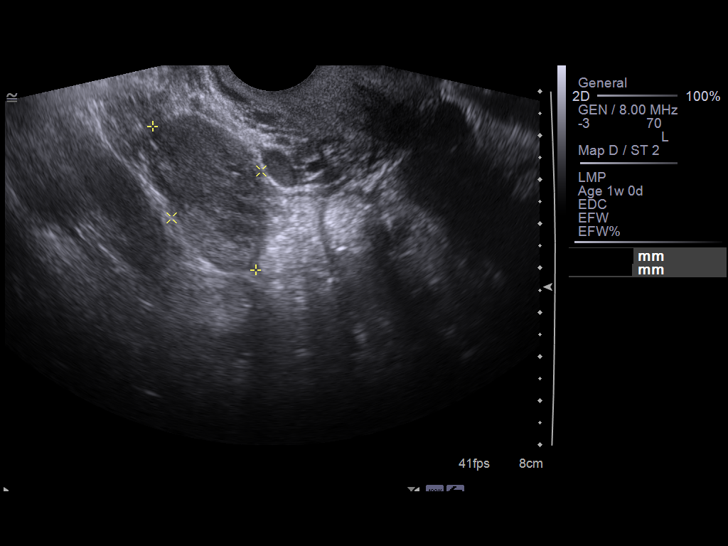
[im 37/40]
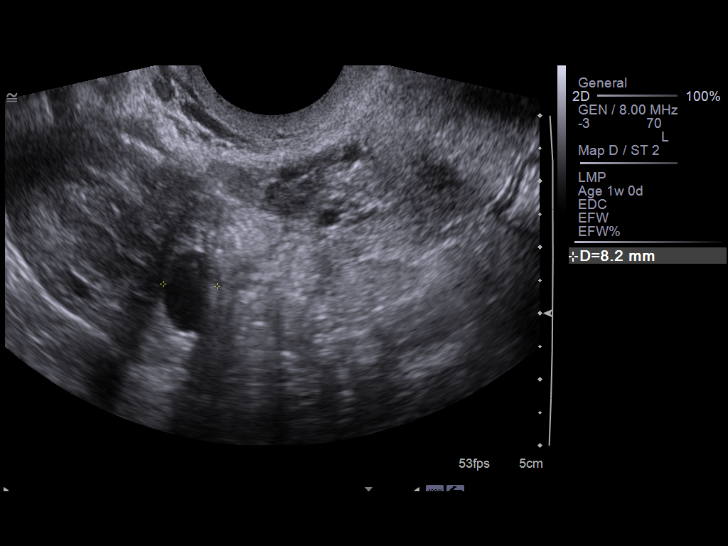
[im 40/40]
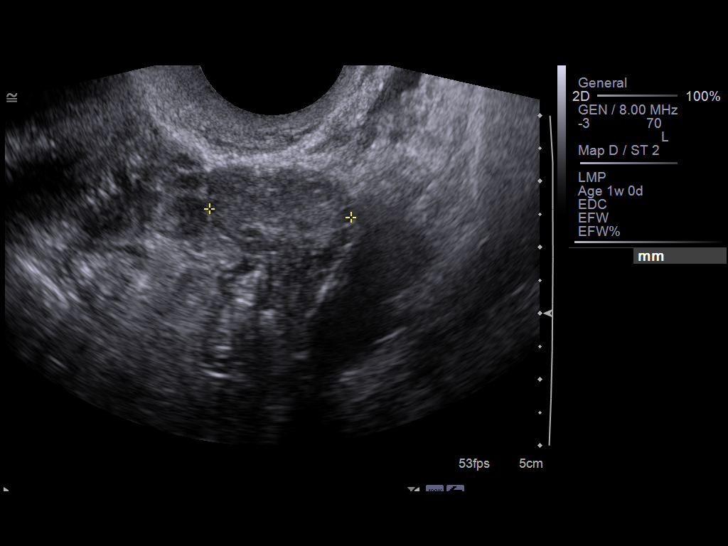

[14 of 28 positions shown; findings below may reference images not displayed]

Intrauterine gestational sac:  Not visualized
Yolk sac: Not visualized
Embryo: Not visualized
Cardiac Activity: Not visualized

Maternal uterus/adnexae:
1.2 x 0.8 x 0.7 cm simple appearing right para ovarian cyst
incidentally noted.  The ovaries are normal.  Small free fluid
incidentally noted.
IMPRESSION: No intrauterine gestational sac, yolk sac, fetal pole, or cardiac
activity visualized. Differential considerations include
intrauterine gestation too early to be sonographically visualized,
spontaneous abortion, or ectopic pregnancy.  Consider follow-up
ultrasound in 14 days and serial quantitative beta HCG follow-up.

## 2014-03-03 IMAGING — US US OB COMP LESS 14 WK
1 series · 14 of 28 positions shown · non-contrast
Comparison: 06/09/2011.

CLINICAL DATA: Vaginal bleeding.

OBSTETRIC <14 WK US AND TRANSVAGINAL OB US
TECHNIQUE: Both transabdominal and transvaginal ultrasound
examinations were performed for complete evaluation of the
gestation as well as the maternal uterus, adnexal regions, and
pelvic cul-de-sac.  Transvaginal technique was performed to assess
early pregnancy.

[Series 1: us ob comp less 14 wk · 0.30mm/px · 14 of 37 slices shown]
[im 2/37]
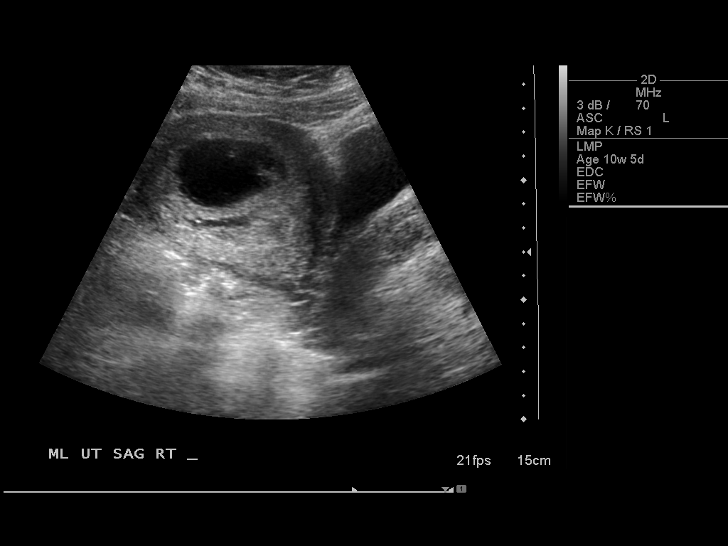
[im 5/37]
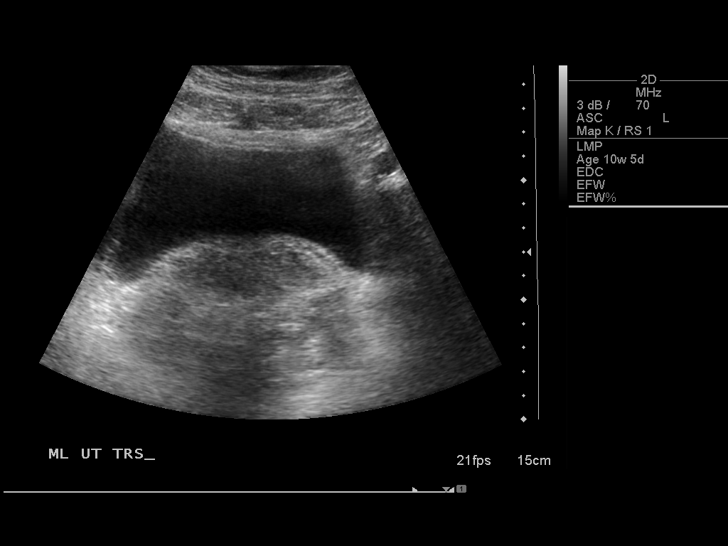
[im 7/37]
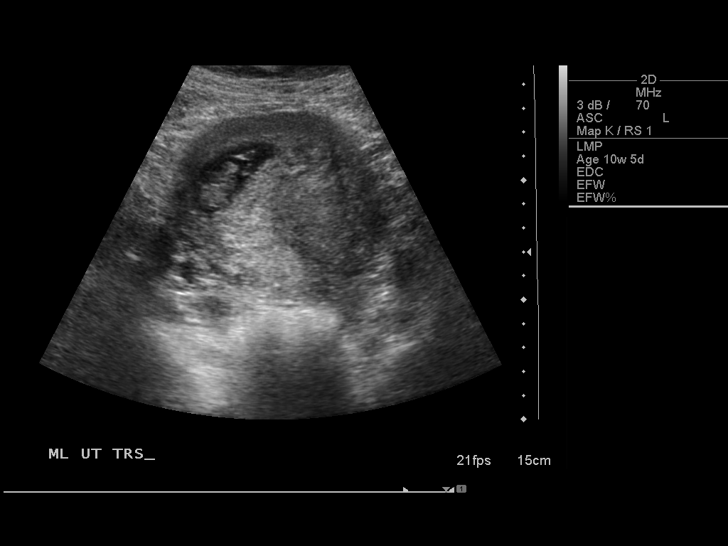
[im 10/37]
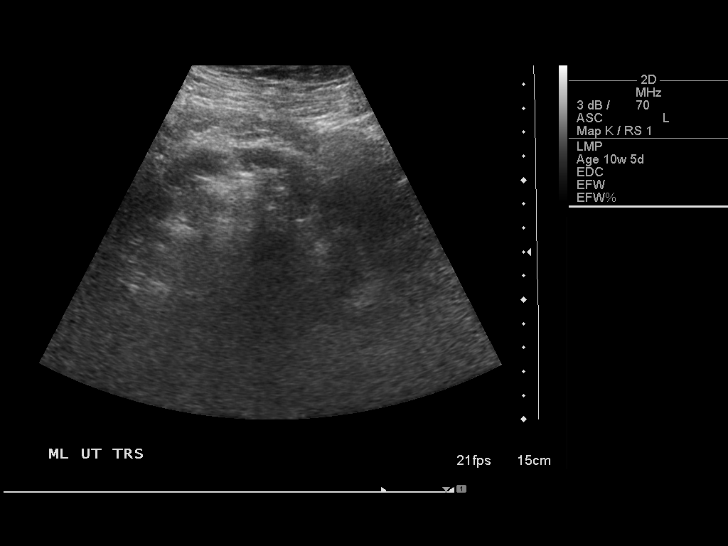
[im 13/37]
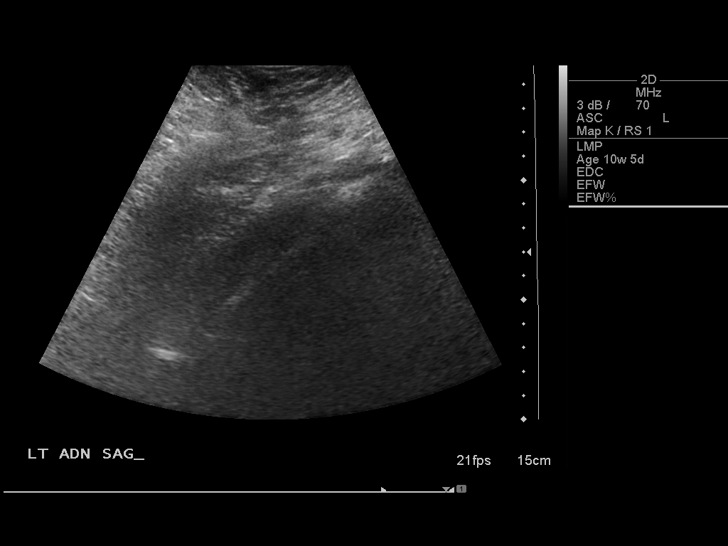
[im 15/37]
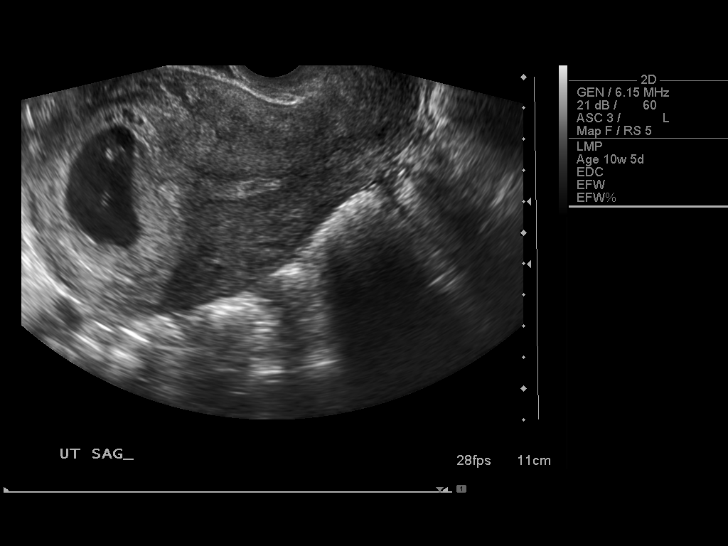
[im 18/37]
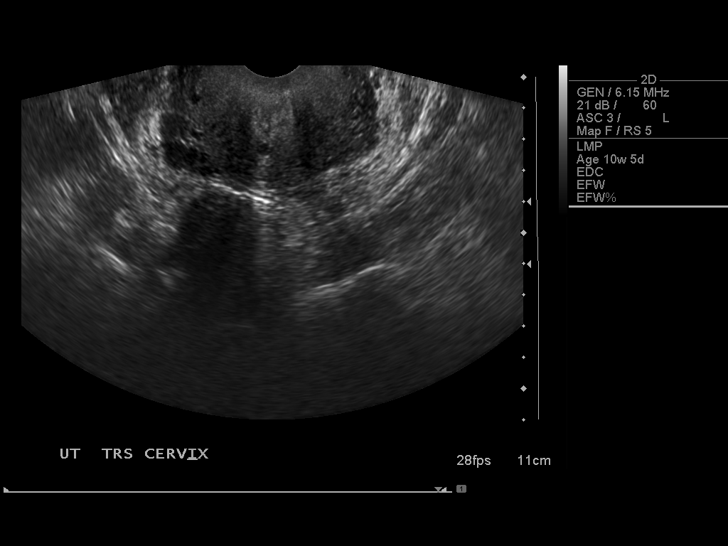
[im 21/37]
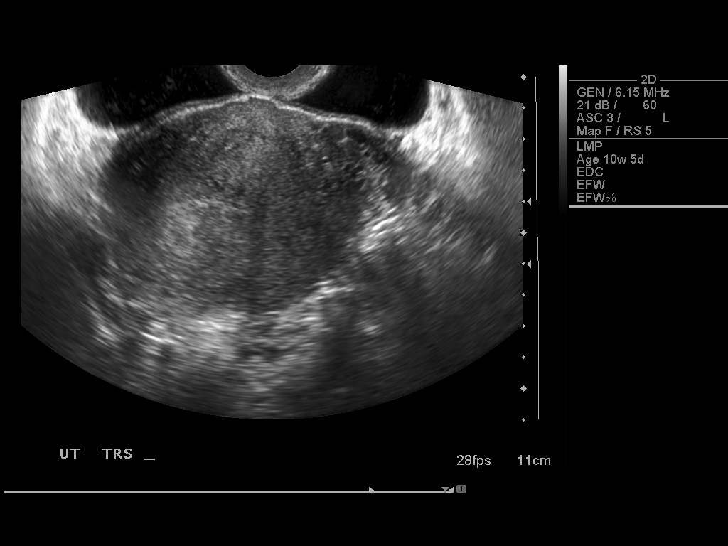
[im 23/37]
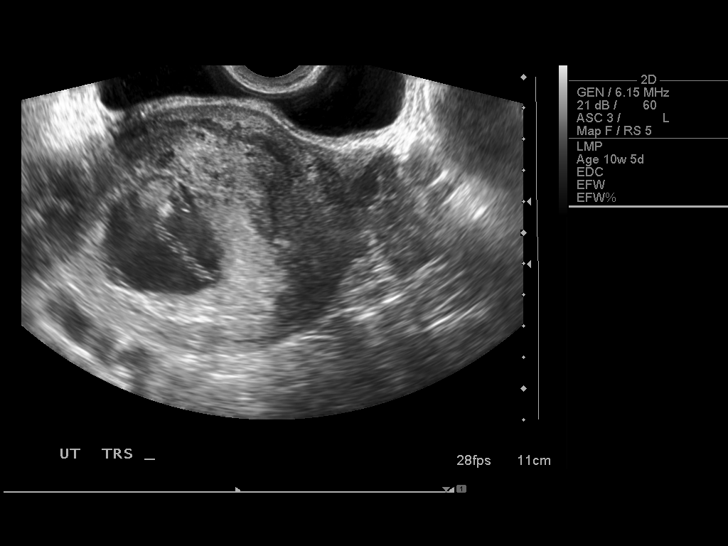
[im 26/37]
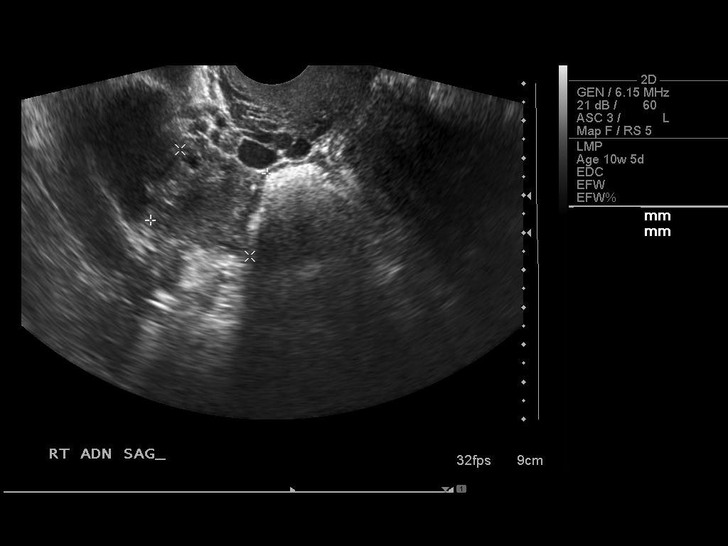
[im 29/37]
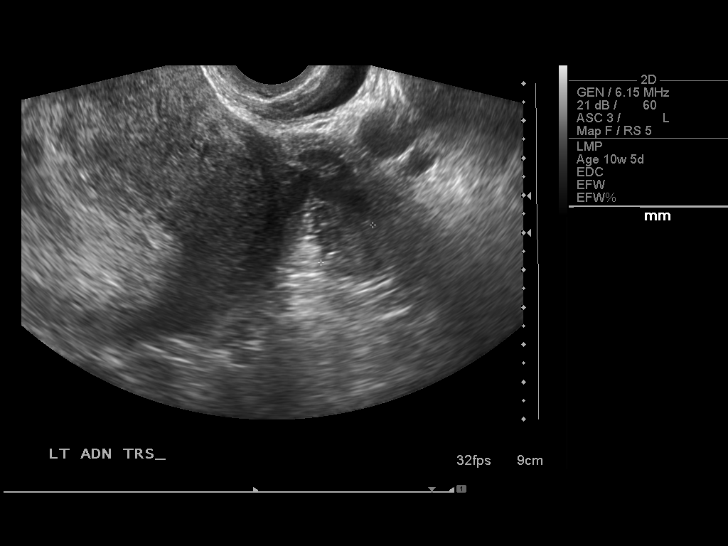
[im 31/37]
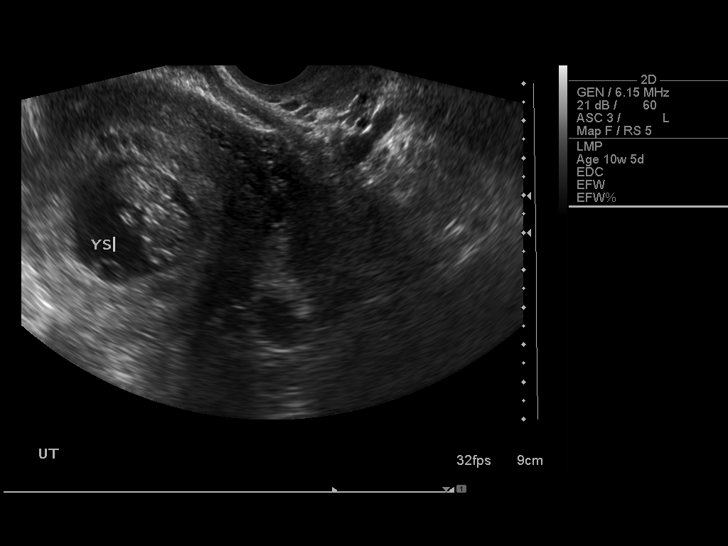
[im 34/37]
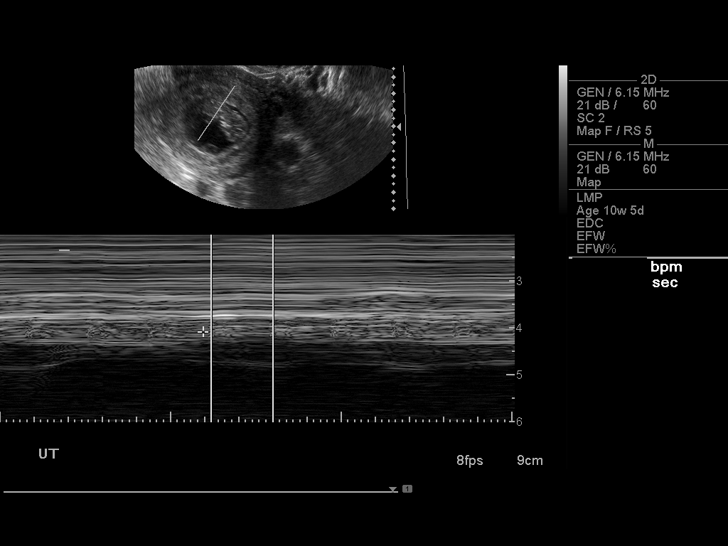
[im 37/37]
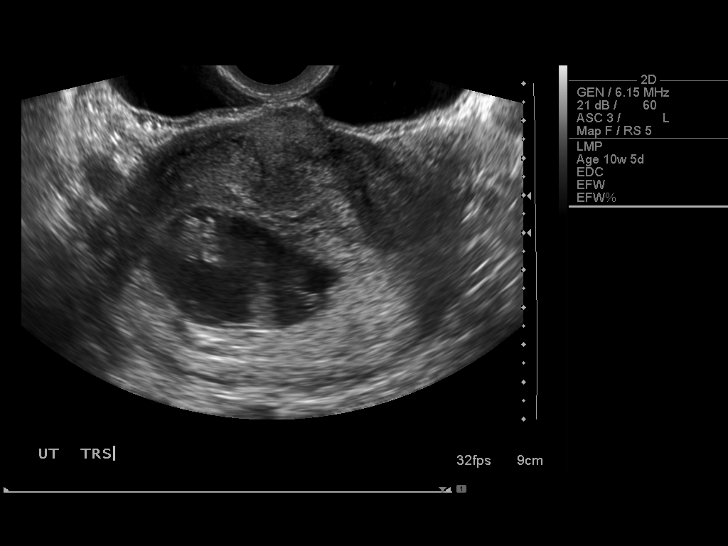

[14 of 28 positions shown; findings below may reference images not displayed]

Intrauterine gestational sac:  Visualized/normal in shape.
Yolk sac: Present
Embryo: Present
Cardiac Activity: Present
Heart Rate: 165 bpm

MSD:   mm      w     d
CRL: 34.8   mm  eight   w  five   d             US EDC: 03/03/2012

Maternal uterus/adnexae:
No subchorionic hemorrhage.
Normal right ovary.
Normal left ovary.
No free pelvic fluid.
IMPRESSION: Single living intrauterine fetus estimated at 8 weeks and 5 days
gestation.
No subchorionic hemorrhage.
Normal ovaries.

## 2015-01-10 ENCOUNTER — Ambulatory Visit (INDEPENDENT_AMBULATORY_CARE_PROVIDER_SITE_OTHER): Payer: Self-pay | Admitting: Family Medicine

## 2015-01-10 VITALS — BP 104/62 | HR 87 | Temp 98.9°F | Resp 18 | Ht 67.0 in | Wt 233.0 lb

## 2015-01-10 DIAGNOSIS — Z3490 Encounter for supervision of normal pregnancy, unspecified, unspecified trimester: Secondary | ICD-10-CM

## 2015-01-10 DIAGNOSIS — Z3201 Encounter for pregnancy test, result positive: Secondary | ICD-10-CM

## 2015-01-10 DIAGNOSIS — N912 Amenorrhea, unspecified: Secondary | ICD-10-CM

## 2015-01-10 LAB — POCT URINE PREGNANCY: Preg Test, Ur: POSITIVE — AB

## 2015-01-10 NOTE — Progress Notes (Signed)
Chief Complaint:  Chief Complaint  Patient presents with  . Other    Pregnancy test, missed periods    HPI: Bonnie Mcneil is a 34 y.o. female who reports to [facility] today complaining of wants pregnancy test LMP 8/6 or 10/20/14 G5L3  Past Medical History  Diagnosis Date  . Cholecystitis 2008  . Intrauterine pregnancy 2004, 2008  . Therapeutic abortion in first trimester 2008    secondary to concern for baby during cholecystecomy  . Chlamydia 2004   Past Surgical History  Procedure Laterality Date  . Cholecystectomy  2008   Social History   Social History  . Marital Status: Divorced    Spouse Name: N/A  . Number of Children: N/A  . Years of Education: N/A   Occupational History  . unemployed    Social History Main Topics  . Smoking status: Never Smoker   . Smokeless tobacco: Never Used  . Alcohol Use: No  . Drug Use: No  . Sexual Activity: Yes    Birth Control/ Protection: None   Other Topics Concern  . None   Social History Narrative   Patient lives with 2 children in [place].    Family History  Problem Relation Age of Onset  . Anesthesia problems Neg Hx   . Hypotension Neg Hx   . Malignant hyperthermia Neg Hx   . Pseudochol deficiency Neg Hx    No Known Allergies Prior to Admission medications   Medication Sig Start Date End Date Taking? Authorizing Provider  Naproxen Sodium (FLANAX PAIN RELIEF PO) Take 1 tablet by mouth every 6 (six) hours as needed. For pain    Historical Provider, MD  norgestimate-ethinyl estradiol (ORTHO-CYCLEN,SPRINTEC,PREVIFEM) 0.25-35 MG-MCG tablet Take 1 tablet by mouth daily.    Historical Provider, MD  predniSONE (DELTASONE) 50 MG tablet Take 1 tablet (50 mg total) by mouth daily. Patient not taking: Reported on 01/10/2015 07/19/13   Charlestine Night, PA-C     ROS: The patient denies fevers, chills, night sweats, unintentional weight loss, chest pain, palpitations, wheezing, dyspnea on exertion, nausea, vomiting,  abdominal pain, dysuria, hematuria, melena, numbness, weakness, or tingling.   All other systems have been reviewed and were otherwise negative with the exception of those mentioned in the HPI and as above.    PHYSICAL EXAM: Filed Vitals:   01/10/15 1110  BP: 104/62  Pulse: 87  Temp: 98.9 F (37.2 C)  Resp: 18   Body mass index is 36.48 kg/(m^2).   General: Alert, no acute distress HEENT:  Normocephalic, atraumatic, oropharynx patent. EOMI, PERRLA Cardiovascular:  Regular rate and rhythm, no rubs murmurs or gallops.  No Carotid bruits, radial pulse intact. No pedal edema.  Respiratory: Clear to auscultation bilaterally.  No wheezes, rales, or rhonchi.  No cyanosis, no use of accessory musculature Abdominal: No organomegaly, abdomen is soft and non-tender, positive bowel sounds. No masses. Skin: No rashes. Neurologic: Facial musculature symmetric. Psychiatric: Patient acts appropriately throughout our interaction. Lymphatic: No cervical or submandibular lymphadenopathy Musculoskeletal: Gait intact. No edema, tenderness   LABS: Results for orders placed or performed in visit on 01/10/15  POCT urine pregnancy  Result Value Ref Range   Preg Test, Ur Positive (A) Negative     EKG/XRAY:   Primary read interpreted by Dr. Conley Rolls at [facility].   ASSESSMENT/PLAN: Encounter Diagnoses  Name Primary?  . Amenorrhea   . Pregnant Yes   Take prenatal Refer to HD for prenatal care Fu prn   Gross sideeffects, risk  and benefits, and alternatives of medications d/w patient. Patient is aware that all medications have potential sideeffects and we are unable to predict every sideeffect or drug-drug interaction that may occur.  Thao Le DO  01/10/2015 11:59 AM

## 2015-01-10 NOTE — Patient Instructions (Signed)
Segundo trimestre de embarazo (Second Trimester of Pregnancy) El segundo trimestre va desde la semana13 hasta la 28, desde el cuarto hasta el sexto mes, y suele ser el momento en el que mejor se siente. Su organismo se ha adaptado a estar embarazada y comienza a sentirse fsicamente mejor. En general, las nuseas matutinas han disminuido o han desaparecido completamente, puede tener ms energa y un aumento de apetito. El segundo trimestre es tambin la poca en la que el feto se desarrolla rpidamente. Hacia el final del sexto mes, el feto mide aproximadamente 9pulgadas (23cm) y pesa alrededor de 1 libras (700g). Es probable que sienta que el beb se mueve (da pataditas) entre las 18 y 20semanas del embarazo. CAMBIOS EN EL ORGANISMO Su organismo atraviesa por muchos cambios durante el embarazo, y estos varan de una mujer a otra.   Seguir aumentando de peso. Notar que la parte baja del abdomen sobresale.  Podrn aparecer las primeras estras en las caderas, el abdomen y las mamas.  Es posible que tenga dolores de cabeza que pueden aliviarse con los medicamentos que el mdico Ozie Dimaria permita tomar.  Tal vez tenga necesidad de orinar con ms frecuencia porque el feto est ejerciendo presin sobre la vejiga.  Debido al embarazo podr sentir acidez estomacal con frecuencia.  Puede estar estreida, ya que ciertas hormonas enlentecen los movimientos de los msculos que empujan los desechos a travs de los intestinos.  Pueden aparecer hemorroides o abultarse e hincharse las venas (venas varicosas).  Puede tener dolor de espalda que se debe al aumento de peso y a que las hormonas del embarazo relajan las articulaciones entre los huesos de la pelvis, y como consecuencia de la modificacin del peso y los msculos que mantienen el equilibrio.  Las mamas seguirn creciendo y Antonis Lor dolern.  Las encas pueden sangrar y estar sensibles al cepillado y al hilo dental.  Pueden aparecer zonas oscuras o  manchas (cloasma, mscara del embarazo) en el rostro que probablemente se atenuar despus del nacimiento del beb.  Es posible que se forme una lnea oscura desde el ombligo hasta la zona del pubis (linea nigra) que probablemente se atenuar despus del nacimiento del beb.  Tal vez haya cambios en el cabello que pueden incluir su engrosamiento, crecimiento rpido y cambios en la textura. Adems, a algunas mujeres se les cae el cabello durante o despus del embarazo, o tienen el cabello seco o fino. Lo ms probable es que el cabello se Joban Colledge normalice despus del nacimiento del beb. QU DEBE ESPERAR EN LAS CONSULTAS PRENATALES Durante una visita prenatal de rutina:  La pesarn para asegurarse de que usted y el feto estn creciendo normalmente.  Marijo Quizon tomarn la presin arterial.  Chareese Sergent medirn el abdomen para controlar el desarrollo del beb.  Se escucharn los latidos cardacos fetales.  Se evaluarn los resultados de los estudios solicitados en visitas anteriores. El mdico puede preguntarle lo siguiente:  Cmo se siente.  Si siente los movimientos del beb.  Si ha tenido sntomas anormales, como prdida de lquido, sangrado, dolores de cabeza intensos o clicos abdominales.  Si est consumiendo algn producto que contenga tabaco, como cigarrillos, tabaco de mascar y cigarrillos electrnicos.  Si tiene alguna pregunta. Otros estudios que podrn realizarse durante el segundo trimestre incluyen lo siguiente:  Anlisis de sangre para detectar lo siguiente:  Concentraciones de hierro bajas (anemia).  Diabetes gestacional (entre la semana 24 y la 28).  Anticuerpos Rh.  Anlisis de orina para detectar infecciones, diabetes o protenas en la   orina.  Una ecografa para confirmar que el beb crece y se desarrolla correctamente.  Una amniocentesis para diagnosticar posibles problemas genticos.  Estudios del feto para descartar espina bfida y sndrome de Down.  Prueba del VIH (virus  de inmunodeficiencia humana). Los exmenes prenatales de rutina incluyen la prueba de deteccin del VIH, a menos que decida no realizrsela. INSTRUCCIONES PARA EL CUIDADO EN EL HOGAR   Evite fumar, consumir hierbas, beber alcohol y tomar frmacos que no Toddrick Sanna hayan recetado. Estas sustancias qumicas afectan la formacin y el desarrollo del beb.  No consuma ningn producto que contenga tabaco, lo que incluye cigarrillos, tabaco de mascar y cigarrillos electrnicos. Si necesita ayuda para dejar de fumar, consulte al mdico. Puede recibir asesoramiento y otro tipo de recursos para dejar de fumar.  Siga las indicaciones del mdico en relacin con el uso de medicamentos. Durante el embarazo, hay medicamentos que son seguros de tomar y otros que no.  Haga ejercicio solamente como se lo haya indicado el mdico. Sentir clicos uterinos es un buen signo para detener la actividad fsica.  Contine comiendo alimentos sanos con regularidad.  Use un sostn que Amaia Lavallie brinde buen soporte si Gordon Vandunk duelen las mamas.  No se d baos de inmersin en agua caliente, baos turcos ni saunas.  Use el cinturn de seguridad en todo momento mientras conduce.  No coma carne cruda ni queso sin cocinar; evite el contacto con las bandejas sanitarias de los gatos y la tierra que estos animales usan. Estos elementos contienen grmenes que pueden causar defectos congnitos en el beb.  Tome las vitaminas prenatales.  Tome entre 1500 y 2000mg de calcio diariamente comenzando en la semana20 del embarazo hasta el parto.  Si est estreida, pruebe un laxante suave (si el mdico lo autoriza). Consuma ms alimentos ricos en fibra, como vegetales y frutas frescos y cereales integrales. Beba gran cantidad de lquido para mantener la orina de tono claro o color amarillo plido.  Dese baos de asiento con agua tibia para aliviar el dolor o las molestias causadas por las hemorroides. Use una crema para las hemorroides si el mdico la  autoriza.  Si tiene venas varicosas, use medias de descanso. Eleve los pies durante 15minutos, 3 o 4veces por da. Limite el consumo de sal en su dieta.  No levante objetos pesados, use zapatos de tacones bajos y mantenga una buena postura.  Descanse con las piernas elevadas si tiene calambres o dolor de cintura.  Visite a su dentista si an no lo ha hecho durante el embarazo. Use un cepillo de dientes blando para higienizarse los dientes y psese el hilo dental con suavidad.  Puede seguir manteniendo relaciones sexuales, a menos que el mdico Kinberly Perris indique lo contrario.  Concurra a todas las visitas prenatales segn las indicaciones de su mdico. SOLICITE ATENCIN MDICA SI:   Tiene mareos.  Siente clicos leves, presin en la pelvis o dolor persistente en el abdomen.  Tiene nuseas, vmitos o diarrea persistentes.  Observa una secrecin vaginal con mal olor.  Siente dolor al orinar. SOLICITE ATENCIN MDICA DE INMEDIATO SI:   Tiene fiebre.  Tiene una prdida de lquido por la vagina.  Tiene sangrado o pequeas prdidas vaginales.  Siente dolor intenso o clicos en el abdomen.  Sube o baja de peso rpidamente.  Tiene dificultad para respirar y siente dolor de pecho.  Sbitamente se Roneka Gilpin hinchan mucho el rostro, las manos, los tobillos, los pies o las piernas.  No ha sentido los movimientos del beb durante   una hora.  Siente un dolor de cabeza intenso que no se alivia con medicamentos.  Su visin se modifica.   Esta informacin no tiene como fin reemplazar el consejo del mdico. Asegrese de hacerle al mdico cualquier pregunta que tenga.   Document Released: 11/09/2004 Document Revised: 02/20/2014 Elsevier Interactive Patient Education 2016 [employer]  

## 2015-02-14 NOTE — L&D Delivery Note (Signed)
Delivery Note Pt started having deep variable and late decels. She was repositioned, and suddenly felt an urge to push. After one push, at 10:35 PM a viable female was delivered via Vaginal, Spontaneous Delivery (Presentation: Right Occiput Anterior).  Delivered in the hands of Leigh Georg Ruddlenne Carpenter, RN under my direct supervision. APGAR: 8, 9; weight 6 lb 6.5 oz (2906 g).  After 2 minutes, the cord was clamped and cut. 40 units of pitocin diluted in 1000cc LR was infused rapidly IV.  The placenta separated spontaneously and delivered via CCT and maternal pushing effort.  It was inspected and appears to be intact with a 3 VC. There was not enough cord blood collected to donate.  Anesthesia: Epidural  Episiotomy: None Lacerations: 1st degree;Perineal Suture Repair: 2.0 vicryl Est. Blood Loss (mL): 50  Mom to postpartum.  Baby to Couplet care / Skin to Skin.  CRESENZO-DISHMAN,Rayland Hamed 05/20/2015, 11:50 PM

## 2015-03-02 ENCOUNTER — Encounter: Payer: Self-pay | Admitting: Obstetrics & Gynecology

## 2015-03-02 ENCOUNTER — Ambulatory Visit (INDEPENDENT_AMBULATORY_CARE_PROVIDER_SITE_OTHER): Payer: Self-pay | Admitting: Obstetrics & Gynecology

## 2015-03-02 VITALS — BP 114/74 | HR 74 | Wt 244.0 lb

## 2015-03-02 DIAGNOSIS — Z113 Encounter for screening for infections with a predominantly sexual mode of transmission: Secondary | ICD-10-CM

## 2015-03-02 DIAGNOSIS — Z3482 Encounter for supervision of other normal pregnancy, second trimester: Secondary | ICD-10-CM

## 2015-03-02 DIAGNOSIS — Z36 Encounter for antenatal screening of mother: Secondary | ICD-10-CM

## 2015-03-02 DIAGNOSIS — O2692 Pregnancy related conditions, unspecified, second trimester: Secondary | ICD-10-CM

## 2015-03-02 DIAGNOSIS — Z3492 Encounter for supervision of normal pregnancy, unspecified, second trimester: Secondary | ICD-10-CM

## 2015-03-02 DIAGNOSIS — O26892 Other specified pregnancy related conditions, second trimester: Secondary | ICD-10-CM

## 2015-03-02 DIAGNOSIS — M545 Low back pain: Secondary | ICD-10-CM

## 2015-03-02 MED ORDER — CYCLOBENZAPRINE HCL 10 MG PO TABS: 10.0000 mg | ORAL_TABLET | Freq: Three times a day (TID) | ORAL | Status: DC | PRN

## 2015-03-02 NOTE — Progress Notes (Signed)
   Subjective:    Bonnie Mcneil is a 35 y.o. [phone] at [redacted]w[redacted]d being seen today for her first obstetrical visit.  She is part of the [facility] program and had a limited ultrasound at 20 weeks which gives her an EDD of 06/05/15.  Her obstetrical history is significant for three previous term SVDs, no complications. Patient does intend to breast feed. Pregnancy history fully reviewed.  Patient reports back pain "muscles in my back hurt".   Filed Vitals:   03/02/15 1358  BP: 114/74  Pulse: 74  Weight: 244 lb (110.678 kg)    HISTORY: OB History  Gravida Para Term Preterm AB SAB TAB Ectopic Multiple Living  0 1 0 1 0 0 3    # Outcome Date GA Lbr Len/2nd Weight Sex Delivery Anes PTL Lv  5 Current           4 Term 02/14/12 [redacted]w[redacted]d 05:22 / 00:02 8 lb 2 oz (3.685 kg) F Vag-Spont EPI  Y     Comments: WNL  3 Term 03/07/06 [redacted]w[redacted]d  8 lb 11 oz (3.941 kg) M Vag-Spont EPI  Y  2 Term 03/23/02   6 lb 6 oz (2.892 kg) F Vag-Spont EPI  Y  1 TAB              Past Medical History  Diagnosis Date  . Cholecystitis 2008  . Intrauterine pregnancy 2004, 2008  . Therapeutic abortion in first trimester 2008    secondary to concern for baby during cholecystecomy  . Chlamydia 2004   Past Surgical History  Procedure Laterality Date  . Cholecystectomy  2008   Family History  Problem Relation Age of Onset  . Anesthesia problems Neg Hx   . Malignant hyperthermia Neg Hx   . Pseudochol deficiency Neg Hx   . Hypotension Mother      Exam    Uterus:  Fundal Height: 29 cm  Pelvic Exam: Deferred. Reports normal pap in 2016.  System: Breast:  normal appearance, no masses or tenderness   Skin: normal coloration and turgor, no rashes   Neurologic: oriented, normal, normal mood   Extremities: normal strength, tone, and muscle mass   HEENT PERRLA, extra ocular movement intact and sclera clear, anicteric   Mouth/Teeth mucous membranes moist, pharynx normal without lesions and dental hygiene good   Neck supple and no masses   Cardiovascular: regular rate and rhythm   Respiratory:  appears well, vitals normal, no respiratory distress, acyanotic, normal RR   Abdomen: soft, non-tender; bowel sounds normal; no masses,  no organomegaly   Urinary: urethral meatus normal    Assessment:    Pregnancy: [id number] Patient Active Problem List   Diagnosis Date Noted  . Obesity complicating pregnancy 10/06/2011  . Supervision of normal pregnancy 07/18/2011     Plan:    Initial labs drawn. Continue Prenatal vitamins. Problem list reviewed and updated. Ultrasound discussed; fetal survey: ordered at Dr. Elsie Stain office Will get Tdap and Flu vaccines at [facility] as part of the [facility] program Flexeril ordered for back pain; recommended Tylenol prn Follow up in 2 weeks for 1 hr GTT, OB visit.    Tereso Newcomer, MD 03/02/2015

## 2015-03-02 NOTE — Patient Instructions (Addendum)
Two vaccines:  TDap and Flu at [facility]  Regrese a la clinica cuando tenga su cita. Si tiene problemas o preguntas, llama a la clinica o vaya a la sala de emergencia al [employer].  Tercer trimestre de Psychiatrist (Third Trimester of Pregnancy) El tercer trimestre comprende desde la semana29 hasta la semana42, es decir, desde el mes7 hasta el [address] El tercer trimestre es un perodo en el que el feto crece rpidamente. Hacia el final del noveno mes, el feto mide alrededor de 20pulgadas (45cm) de largo y pesa entre 6 y 10 libras (2,700 y 46,500kg).  CAMBIOS EN EL ORGANISMO Su organismo atraviesa por muchos cambios durante el [place], y estos varan de Neomia Dear mujer a Educational psychologist.   Seguir American Standard Companies. Es de esperar que aumente entre 25 y 35libras (11 y 16kg) hacia el final del Psychiatrist.  Podrn aparecer las primeras Albertson's caderas, el abdomen y las [place].  Puede tener necesidad de Geographical information systems officer con ms frecuencia porque el feto baja hacia la pelvis y ejerce presin sobre la vejiga.  Debido al Vanetta Mulders podr sentir Anthoney Harada estomacal con frecuencia.  Puede estar estreida, ya que ciertas hormonas enlentecen los movimientos de los msculos que [employer] desechos a travs de los intestinos.  Pueden aparecer hemorroides o abultarse e hincharse las venas (venas varicosas).  Puede sentir dolor plvico debido al [employer] y a que las hormonas del Management consultant las articulaciones entre los huesos de la pelvis. El dolor de espalda puede ser consecuencia de la sobrecarga de los msculos que soportan la [place].  Tal vez haya cambios en el cabello que pueden incluir su engrosamiento, crecimiento rpido y cambios en la textura. Adems, a algunas mujeres se les cae el cabello durante o despus del embarazo, o tienen el cabello seco o fino. Lo ms probable es que el cabello se le normalice despus del nacimiento del beb.  Las ConAgra Foods seguirn creciendo y Development worker, community. A veces,  puede haber una secrecin amarilla de las mamas llamada calostro.  El ombligo puede salir hacia afuera.  Puede sentir que le falta el aire debido a que se expande el tero.  Puede notar que el feto "baja" o lo siente ms bajo, en el abdomen.  Puede tener una prdida de secrecin mucosa con sangre. Esto suele ocurrir en el trmino de unos [address] a una semana antes de que comience el [place].  El cuello del tero se vuelve delgado y blando (se borra) cerca de la fecha de [place]. QU DEBE ESPERAR EN LOS EXMENES PRENATALES  Le harn exmenes prenatales cada 2semanas hasta la semana36. A partir de ese momento le harn exmenes semanales. Durante una visita prenatal de rutina:  La pesarn para asegurarse de que usted y el feto estn creciendo normalmente.  Le tomarn la presin arterial.  Le medirn el abdomen para controlar el desarrollo del beb.  Se escucharn los latidos cardacos fetales.  Se evaluarn los resultados de los estudios solicitados en visitas anteriores.  Le revisarn el cuello del tero cuando est prxima la fecha de parto para controlar si este se ha borrado. Alrededor de la semana36, el mdico le revisar el cuello del tero. Al mismo tiempo, realizar un anlisis de las secreciones del tejido vaginal. Este examen es para determinar si hay un tipo de bacteria, estreptococo Grupo B. El mdico le explicar esto con ms detalle. El mdico puede preguntarle lo siguiente:  Cmo le gustara que fuera el [place].  Cmo  se siente.  Si siente los movimientos del beb.  Si ha tenido sntomas anormales, como prdida de lquido, [place], dolores de cabeza intensos o clicos abdominales.  Si est consumiendo algn producto que contenga tabaco, como cigarrillos, tabaco de Theatre manager y Administrator, Civil Service.  Si tiene Colgate-Palmolive. Otros exmenes o estudios de deteccin que pueden realizarse durante el tercer trimestre incluyen lo siguiente:  Anlisis de  sangre para controlar los niveles de hierro (anemia).  Controles fetales para determinar su salud, nivel de [place] y Designer, jewellery. Si tiene [place] enfermedad o hay problemas durante el embarazo, le harn estudios.  Prueba del VIH (virus de inmunodeficiencia humana). Si corre Chiropodist, pueden realizarle una prueba de deteccin del VIH durante el tercer trimestre del embarazo. FALSO TRABAJO DE PARTO Es posible que sienta contracciones leves e irregulares que finalmente desaparecen. Se llaman contracciones de [address] o falso trabajo de [place]. Las [employer] pueden durar horas, [address] o incluso semanas, antes de que el verdadero trabajo de parto se inicie. Si las contracciones ocurren a intervalos regulares, se intensifican o se hacen dolorosas, lo mejor es que la revise el mdico.  SIGNOS DE TRABAJO DE PARTO   Clicos de tipo menstrual.  Contracciones cada o menos.  Contracciones que comienzan en la parte superior del tero y se extienden hacia abajo, a la zona inferior del abdomen y la espalda.  Sensacin de mayor presin en la pelvis o dolor de espalda.  Una secrecin de mucosidad acuosa o con sangre que sale de la vagina. Si tiene alguno de estos signos antes de la semana37 del Psychiatrist, llame a su mdico de inmediato. Debe concurrir al hospital para que la controlen inmediatamente. INSTRUCCIONES PARA EL CUIDADO EN EL HOGAR   Evite fumar, consumir hierbas, beber alcohol y tomar frmacos que no le hayan recetado. Estas sustancias qumicas afectan la formacin y el desarrollo del beb.  No consuma ningn producto que contenga tabaco, lo que incluye cigarrillos, tabaco de Theatre manager y Administrator, Civil Service. Si necesita ayuda para dejar de fumar, consulte al American Express. Puede recibir asesoramiento y otro tipo de recursos para dejar de fumar.  Siga las indicaciones del mdico en relacin con el uso de medicamentos. Durante el embarazo, hay medicamentos que son seguros de  tomar y otros que no.  Haga ejercicio solamente como se lo haya indicado el mdico. Sentir clicos uterinos es un buen signo para Restaurant manager, fast food actividad fsica.  Contine comiendo alimentos sanos con regularidad.  Use un sostn que le brinde buen soporte si le Altria Group.  No se d baos de inmersin en agua caliente, baos turcos ni saunas.  Use el cinturn de seguridad en todo momento mientras conduce.  No coma carne cruda ni queso sin cocinar; evite el contacto con las bandejas sanitarias de los gatos y la tierra que estos animales usan. Estos elementos contienen grmenes que pueden causar defectos congnitos en el beb.  Tome las vitaminas prenatales.  Tome entre 1500 y 2000mg  de calcio diariamente comenzando en la semana20 del embarazo [place].  Si est estreida, pruebe un laxante suave (si el mdico lo autoriza). Consuma ms alimentos ricos en fibra, como vegetales y frutas frescos y Radiation protection practitioner. Beba gran cantidad de lquido para mantener la orina de tono claro o color amarillo plido.  Dese baos de asiento con agua tibia para Engineer, materials o las molestias causadas por las hemorroides. Use una crema para las hemorroides si el mdico la autoriza.  Si tiene venas varicosas,  use medias de descanso. Eleve los pies durante , 3 o 4veces por da. Limite el consumo de sal en su dieta.  Evite levantar objetos pesados, use zapatos de tacones bajos y [place].  Descanse con las piernas elevadas si tiene calambres o dolor de cintura.  Visite a su dentista si no lo ha Occupational hygienist. Use un cepillo de dientes blando para higienizarse los dientes y psese el hilo dental con suavidad.  Puede seguir Cal[employer] Corporation, a menos que el mdico le indique lo contrario.  No haga viajes largos excepto que sea absolutamente necesario y solo con la autorizacin del mdico.  Tome clases prenatales para Financial trader, Education administrator  y hacer preguntas sobre el [place] de parto y [place].  Haga un ensayo de la partida al hospital.  Prepare el bolso que llevar al hospital.  Prepare la habitacin del beb.  Concurra a todas las visitas prenatales segn las indicaciones de su mdico. SOLICITE ATENCIN MDICA SI:  No est segura de que est en trabajo de parto o de que ha roto la bolsa de las aguas.  Tiene mareos.  Siente clicos leves, presin en la pelvis o dolor persistente en el abdomen.  Tiene nuseas, vmitos o diarrea persistentes.  Brett Fairy secrecin vaginal con mal olor.  Siente dolor al ConocoPhillips. SOLICITE ATENCIN MDICA DE INMEDIATO SI:   Tiene fiebre.  Tiene una prdida de lquido por la vagina.  Tiene sangrado o pequeas prdidas vaginales.  Siente dolor intenso o clicos en el abdomen.  Sube o baja de peso rpidamente.  Tiene dificultad para respirar y siente dolor de pecho.  Sbitamente se le hinchan mucho el rostro, las [place], los tobillos, los pies o las piernas.  No ha sentido los movimientos del beb durante Georgianne Fick.  Siente un dolor de cabeza intenso que no se alivia con medicamentos.  Su visin se modifica.   Esta informacin no tiene Theme park manager el consejo del mdico. Asegrese de hacerle al mdico cualquier pregunta que tenga.   Document Released: 11/09/2004 Document Revised: 02/20/2014 Elsevier Interactive Patient Education 2016 [employer].  [place] materna (Breastfeeding) Decidir Museum/gallery exhibitions officer es una de las mejores elecciones que puede hacer por usted y su beb. El cambio hormonal durante el Psychiatrist produce el desarrollo del tejido mamario y [place] la cantidad y el tamao de los conductos galactforos. Estas hormonas tambin permiten que las protenas, los azcares y las grasas de la sangre produzcan la [employer] materna en las glndulas productoras de [place]. Las hormonas impiden que la leche materna sea liberada antes del nacimiento del beb, adems de impulsar el  flujo de leche luego del nacimiento. Una vez que ha comenzado a Museum/gallery exhibitions officer, Conservation officer, nature beb, as Immunologist succin o Theatre manager, pueden estimular la liberacin de [place] de las glndulas productoras de [place].  LOS BENEFICIOS DE AMAMANTAR Para el beb  La primera leche (calostro) ayuda a Careers information officer funcionamiento del sistema digestivo del beb.  La leche tiene anticuerpos que ayudan a Radio producer las infecciones en el beb.  El beb tiene una menor incidencia de asma, alergias y del sndrome de muerte sbita del lactante.  Los nutrientes en la [place] materna son mejores para el beb que la [place] maternizada y estn preparados exclusivamente para cubrir las necesidades del beb.  La leche materna mejora el desarrollo cerebral del beb.  Es menos probable que el beb desarrolle otras enfermedades, como obesidad infantil, asma o diabetes mellitus de tipo 2. Para usted  La lactancia materna favorece el desarrollo de un vnculo muy especial entre la madre y el beb.  Es conveniente. La leche materna siempre est disponible a la Human resources officer y es [place].  La lactancia materna ayuda a quemar caloras y a perder el peso ganado durante el [place].  Favorece la contraccin del tero al tamao que tena antes del embarazo de manera ms rpida y disminuye el sangrado (loquios) despus del parto.  La lactancia materna contribuye a reducir Nurse, adult de desarrollar diabetes mellitus de tipo 2, osteoporosis o cncer de mama o de ovario en el futuro. SIGNOS DE QUE EL BEB EST HAMBRIENTO Primeros signos de [address] de [place].  Se estira.  Mueve la cabeza de un lado a otro.  Mueve la cabeza y abre la boca cuando se le toca la mejilla o la comisura de la boca (reflejo de bsqueda).  Aumenta las vocalizaciones, tales como sonidos de succin, se relame los labios, emite arrullos, suspiros, o chirridos.  Mueve la [employer] boca.  Se chupa con ganas los dedos o  las manos. Signos tardos de Fisher [employer].  Llora de manera intermitente. Signos de [employer] Corporation signos de hambre extrema requerirn que lo calme y lo consuele antes de que el beb pueda alimentarse adecuadamente. No espere a que se manifiesten los siguientes signos de hambre extrema para comenzar a Museum/gallery exhibitions officer:   Designer, jewellery.  Llanto intenso y fuerte.  Gritos. INFORMACIN BSICA SOBRE LA LACTANCIA MATERNA Iniciacin de la lactancia materna  Encuentre un lugar cmodo para sentarse o acostarse, con un buen respaldo para el cuello y la espalda.  Coloque una almohada o una manta enrollada debajo del beb para acomodarlo a la altura de la mama (si est sentada). Las almohadas para Museum/gallery exhibitions officer se han diseado especialmente a fin de servir de apoyo para los brazos y el beb [employer].  Asegrese de que el abdomen del beb est frente al suyo.   Masajee suavemente la mama. Con las yemas de los dedos, masajee la pared del pecho hacia el pezn en un movimiento circular. Esto estimula el flujo de [place]. Es posible que Engineer, manufacturing systems este movimiento mientras amamanta si la leche fluye lentamente.  Sostenga la mama con el pulgar por arriba del pezn y los otros 4 dedos por debajo de la mama. Asegrese de que los dedos se encuentren lejos del pezn y de la boca del beb.  Empuje suavemente los labios del beb con el pezn o con el dedo.  Cuando la boca del beb se abra lo suficiente, acrquelo rpidamente a la mama e introduzca todo el pezn y la zona oscura que lo rodea (areola), tanto como sea posible, dentro de la boca del beb.  Debe haber ms areola visible por arriba del labio superior del beb que por debajo del labio inferior.  La lengua del beb debe estar entre la enca inferior y la [place].  Asegrese de que la boca del beb est en la posicin correcta alrededor del pezn (prendida). Los labios del beb deben crear un sello sobre la mama y estar doblados hacia afuera  (invertidos).  Es comn que el beb succione durante 2 a 3 minutos para que comience el flujo de [place]. Cmo debe prenderse Es muy importante que le ensee al beb cmo prenderse adecuadamente a la mama. Si el beb no se prende adecuadamente, puede causarle dolor en el pezn y reducir la produccin de [place], y Radio producer  que el beb tenga un escaso aumento de [place]. Adems, si el beb no se prende adecuadamente al pezn, puede tragar aire durante la alimentacin. Esto puede causarle molestias al beb. Hacer eructar al beb al [place] de mama puede ayudarlo a liberar el aire. Sin embargo, ensearle al beb cmo prenderse a la mama adecuadamente es la mejor manera de evitar que se sienta molesto por tragar Oceanographer se alimenta. Signos de que el beb se ha prendido adecuadamente al pezn:   Payton Doughty o succiona de modo silencioso, sin causarle dolor.  Se escucha que traga cada 3 o 4 succiones.  Hay movimientos musculares por arriba y por delante de sus odos al Printmaker. Signos de que el beb no se ha prendido Audiological scientist al pezn:   Hace ruidos de succin o de chasquido mientras se alimenta.  Siente dolor en el pezn. Si cree que el beb no se prendi correctamente, deslice el dedo en la comisura de la boca y [employer] Corporation las encas del beb para interrumpir la succin. Intente comenzar a amamantar nuevamente. Signos de Fish farm manager Signos del beb:   Disminuye gradualmente el nmero de succiones o cesa la succin por completo.  Se duerme.  Relaja el cuerpo.  Retiene una pequea cantidad de [facility] boca.  Se desprende solo del pecho. Signos que presenta usted:  Las mamas han aumentado la firmeza, el peso y el tamao 1 a 3 horas despus de Museum/gallery exhibitions officer.  Estn ms blandas inmediatamente despus de amamantar.  Un aumento del volumen de [place], y tambin un cambio en su consistencia y color se producen hacia el quinto da de Tour manager.  Los  pezones no duelen, ni estn agrietados ni sangran. Signos de que su beb recibe la cantidad de leche suficiente  Moja al menos 3 paales en 24 horas. La orina debe ser clara y de color amarillo plido a los 5 [address] de [place].  Defeca al menos 3 veces en 24 horas a los 5 [address] de [address] La materia fecal debe ser blanda y [place].  Defeca al menos 3 veces en 24 horas a los [address] de [address] La materia fecal debe ser grumosa y [place].  No registra una prdida de peso mayor del 10% del peso al nacer durante los primeros 3 [address] de [place].  Aumenta de peso un promedio de 4 a 7onzas (113 a 198g) por semana despus de los 4 [address] de vida.  Aumenta de [place], [place], de [place] uniforme a Glass blower/designer de los 5 [address] de vida, sin Passenger transport manager prdida de peso despus de las 2semanas de vida. Despus de alimentarse, es posible que el beb regurgite una pequea cantidad. Esto es frecuente. FRECUENCIA Y DURACIN DE LA LACTANCIA MATERNA El amamantamiento frecuente la ayudar a producir ms [place] y a Education officer, community de Engineer, mining en los pezones e hinchazn en las [place]. Alimente al beb cuando muestre signos de hambre o si siente la necesidad de reducir la congestin de las [place]. Esto se denomina "lactancia a demanda". Evite el uso del chupete mientras trabaja para establecer la lactancia (las primeras 4 a 6 semanas despus del nacimiento del beb). Despus de este perodo, podr ofrecerle un chupete. Las investigaciones demostraron que el uso del chupete durante el primer ao de vida del beb disminuye el riesgo de desarrollar el sndrome de muerte sbita del lactante (SMSL). Permita que el nio se alimente en cada mama todo lo que desee. Contine amamantando al beb hasta que haya terminado de alimentarse. Cuando  el beb se desprende o se queda dormido mientras se est alimentando de la primera mama, ofrzcale la segunda. Debido a que, con frecuencia, los recin [place] las primeras semanas de  vida, es posible que deba despertar al beb para alimentarlo. Los horarios de Acupuncturist de un beb a otro. Sin embargo, las siguientes reglas pueden servir como gua para ayudarla a Lawyer que el beb se alimenta adecuadamente:  Se puede amamantar a los recin nacidos (bebs de 4 semanas o menos de vida) cada 1 a 3 horas.  No deben transcurrir ms de 3 horas durante el da o 5 horas durante la noche sin que se amamante a los recin nacidos.  Debe amamantar al beb 8 veces como mnimo en un perodo de 24 horas, hasta que comience a introducir slidos en su dieta, a los 6 meses de vida aproximadamente. EXTRACCIN DE Dean Foods Company MATERNA La extraccin y Contractor de la leche materna le permiten asegurarse de que el beb se alimente exclusivamente de [place], aun en momentos en los que no puede amamantar. Esto tiene especial importancia si debe regresar al Aleen Campi en el perodo en que an est amamantando o si no puede estar presente en los momentos en que el beb debe alimentarse. Su asesor en lactancia puede orientarla sobre cunto tiempo es seguro almacenar [place].  El sacaleche es un aparato que le permite extraer leche de la mama a un recipiente estril. Luego, la leche materna extrada puede almacenarse en un refrigerador o Electrical engineer. Algunos sacaleches son Birdie Riddle, Delaney Meigs otros son elctricos. Consulte a su asesor en lactancia qu tipo ser ms conveniente para usted. Los sacaleches se pueden comprar; sin embargo, algunos hospitales y grupos de apoyo a la lactancia materna alquilan Sports coach. Un asesor en lactancia puede ensearle cmo extraer [employer], en caso de que prefiera no usar un sacaleche.  CMO CUIDAR LAS MAMAS DURANTE LA LACTANCIA MATERNA Los pezones se secan, agrietan y duelen durante la Tour manager. Las siguientes recomendaciones pueden ayudarla a Pharmacologist las [employer] Partners y sanas:  Careers information officer usar jabn en los  pezones.  Use un sostn de soporte. Aunque no son esenciales, las camisetas sin mangas o los sostenes especiales para Museum/gallery exhibitions officer estn diseados para acceder fcilmente a las mamas, para Museum/gallery exhibitions officer sin tener que quitarse todo el sostn o la camiseta. Evite usar sostenes con aro o sostenes muy ajustados.  Seque al aire sus pezones durante 3 a despus de amamantar al beb.  Utilice solo apsitos de Haematologist sostn para Environmental health practitioner las prdidas de [place]. La prdida de un poco de Public Service Enterprise Group tomas es normal.  Utilice lanolina sobre los pezones luego de Museum/gallery exhibitions officer. La lanolina ayuda a mantener la humedad normal de la piel. Si [place] lanolina pura, no tiene que lavarse los pezones antes de volver a Corporate treasurer al beb. La lanolina pura no es txica para el beb. Adems, puede extraer [employer] algunas gotas de [place] materna y Engineer, maintenance (IT) suavemente esa Winn[employer], para que la [place] se seque al aire. Durante las primeras semanas despus de dar a luz, algunas mujeres pueden experimentar hinchazn en las mamas (congestin [place]). La congestin puede hacer que sienta las mamas pesadas, calientes y sensibles al tacto. El pico de la congestin ocurre dentro de los 3 a 5 das despus del [place]. Las siguientes recomendaciones pueden ayudarla a Paramedic la congestin:  Vace por completo las mamas al [employer]COMM o Environmental health practitioner. Puede aplicar calor hmedo en  las mamas (en la ducha o con toallas hmedas para manos) antes de Museum/gallery exhibitions officer o extraer W[employer] Resources. Esto aumenta la circulacin y [place] a que la GerinConAgra Foods Si el beb no vaca por completo las [address] cuando lo [address], extraiga la [place] restante despus de que haya finalizado.  Use un sostn ajustado (para amamantar o comn) o una camiseta sin mangas durante 1 o 2 das para indicar al cuerpo que disminuya ligeramente la produccin de [place].  Aplique compresas de hielo Yahoo! Inc, a menos que le resulte demasiado incmodo.  Asegrese  de que el beb est prendido y se encuentre en la posicin correcta mientras lo alimenta. Si la congestin persiste luego de 48 horas o despus de seguir estas recomendaciones, comunquese con su mdico o un Holiday representative. RECOMENDACIONES GENERALES PARA EL CUIDADO DE LA SALUD DURANTE LA LACTANCIA MATERNA  Consuma alimentos saludables. Alterne comidas y colaciones, y coma 3 de cada una por da. Dado que lo que come Danaher Corporation, es posible que algunas comidas hagan que su beb se vuelva ms irritable de lo habitual. Evite comer este tipo de alimentos si percibe que afectan de manera negativa al beb.  Beba leche, jugos de fruta y agua para Patent examiner su sed (aproximadamente 10 vasos al Futures trader).  Descanse con frecuencia, reljese y tome sus vitaminas prenatales para evitar la fatiga, el estrs y la anemia.  Contine con los autocontroles de la mama.  Evite Product manager y fumar tabaco. Las sustancias qumicas de los cigarrillos que pasan a la leche materna y la exposicin al humo ambiental del tabaco pueden daar al beb.  No consuma alcohol ni drogas, incluida la marihuana. Algunos medicamentos, que pueden ser perjudiciales para el beb, pueden pasar a travs de la Colgate Pal[employer]ive. Es importante que consulte a su mdico antes de Medical sales representative, incluidos todos los medicamentos recetados y de [place], as como los suplementos vitamnicos y herbales. Puede quedar embarazada durante la lactancia. Si desea controlar la natalidad, consulte a su mdico cules son las opciones ms seguras para el beb. SOLICITE ATENCIN MDICA SI:   Usted siente que quiere dejar de Museum/gallery exhibitions officer o se siente frustrada con la lactancia.  Siente dolor en las mamas o en los pezones.  Sus pezones estn agrietados o Water quality scientist.  Sus pechos estn irritados, sensibles o calientes.  Tiene un rea hinchada en cualquiera de las mamas.  Siente escalofros o fiebre.  Tiene nuseas o vmitos.  Presenta  una secrecin de otro lquido distinto de la leche materna de los pezones.  Sus mamas no se llenan antes de Museum/gallery exhibitions officer al beb para el quinto da despus del [place].  Se siente triste y deprimida.  El beb est demasiado somnoliento como para comer bien.  El beb tiene problemas para dormir.  Moja menos de 3 paales en 24 horas.  Defeca menos de 3 veces en 24 horas.  La piel del beb o la parte blanca de los ojos se vuelven amarillentas.  El beb no ha aumentado de [place] a los [address] de [place]. SOLICITE ATENCIN MDICA DE INMEDIATO SI:   El beb est muy cansado Retail buyer) y no se quiere despertar para comer.  Le sube la fiebre sin causa.   Esta informacin no tiene Theme park manager el consejo del mdico. Asegrese de hacerle al mdico cualquier pregunta que tenga.   Document Released: 01/30/2005 Document Revised: 10/21/2014 Elsevier Interactive Patient Education 2016 [employer].  Informacin sobre el parto prematuro  (Preterm Labor Information)  El parto prematuro comienza antes de la semana 37 de [place]. La duracin de un embarazo normal es de 39 a 41 semanas.  CAUSAS  Generalmente no hay una causa que pueda identificarse del motivo por el que una mujer comienza un trabajo de parto prematuro. Sin embargo, una de las causas conocidas ms frecuentes son las infecciones. Las infecciones del tero, el cuello, la vagina, el lquido [place], la vejiga, los riones y Teacher, adult education de los pulmones (neumona) pueden hacer que el trabajo de parto se inicie. Otras causas que pueden sospecharse son:   Infecciones urogenitales, como infecciones por hongos y vaginosis bacteriana.   Anormalidades uterinas (forma del tero, sptum uterino, fibromas, hemorragias en la placenta).   Un cuello que ha sido operado (puede ser que no permanezca cerrado).   Malformaciones del feto.   Gestaciones mltiples (mellizos, trillizos y ms).   Ruptura del saco amnitico.  FACTORES DE RIESGO    Historia previa de parto prematuro.   Tener ruptura prematura de las membranas (RPM).   La placenta cubre la abertura del cuello (placenta previa).   La placenta se separa del tero (abrupcin placentaria).   El cuello es demasiado dbil para contener al beb en el tero (cuello incompetente).   Hay mucho lquido en el saco amnitico (polihidramnios).   Consumo de drogas o hbito de fumar durante Firefighter.   No aumentar de peso lo suficiente durante el [employer].   Mujeres menores de 18 aos o mayores de [address] [place].   Nivel socioeconmico bajo.   Pertenecer a Engineer, production. SNTOMAS  Los signos y sntomas del trabajo de parto prematuro son:   Public librarian similares a los Designer, jewellery, dolor abdominal o dolor de espalda.  Contracciones uterinas regulares, tan frecuentes como seis por hora, sin importar su intensidad (pueden ser suaves o dolorosas).  Contracciones que comienzan en la parte superior del tero y se expanden hacia abajo, a la zona inferior del abdomen y la espalda.   Sensacin de aumento de presin en la pelvis.   Aparece una secrecin acuosa o sanguinolenta por la vagina.  TRATAMIENTO  Segn el tiempo del embarazo y otras [place], el mdico puede indicar reposo en cama. Si es necesario, le indicarn medicamentos para TEFL teacher las contracciones y para Customer service manager los pulmones del feto. Si el trabajo de parto se inicia antes de las 34 semanas de [place], se recomienda la hospitalizacin. El tratamiento depende de las condiciones en que se encuentren usted y el feto.  QU DEBE HACER SI PIENSA QUE EST EN TRABAJO DE PARTO PREMATURO?  Comunquese con su mdico inmediatamente. Debe concurrir al hospital para ser controlada inmediatamente.  CMO PUEDE EVITAR EL TRABAJO DE PARTO PREMATURO EN FUTUROS EMBARAZOS?  Usted debe:   Si fuma, abandonar el hbito.  Mantener un peso saludable y evitar sustancias qumicas y drogas innecesarias.  Controlar  todo tipo de infeccin.  Informe a su mdico si tiene una historia conocida de trabajo de parto prematuro.   Esta informacin no tiene Theme park manager el consejo del mdico. Asegrese de hacerle al mdico cualquier pregunta que tenga.   Document Released: 05/09/2007 Document Revised: 10/02/2012 Elsevier Interactive Patient Education [employer]

## 2015-03-03 LAB — PRENATAL PROFILE (SOLSTAS)
ANTIBODY SCREEN: NEGATIVE
BASOS PCT: 0 % (ref 0–1)
Basophils Absolute: 0 10*3/uL (ref 0.0–0.1)
EOS ABS: 0.1 10*3/uL (ref 0.0–0.7)
EOS PCT: 1 % (ref 0–5)
HCT: 38.1 % (ref 36.0–46.0)
HEMOGLOBIN: 12.8 g/dL (ref 12.0–15.0)
HIV 1&2 Ab, 4th Generation: NONREACTIVE
Hepatitis B Surface Ag: NEGATIVE
Lymphocytes Relative: 14 % (ref 12–46)
Lymphs Abs: 1.7 10*3/uL (ref 0.7–4.0)
MCH: 29.8 pg (ref 26.0–34.0)
MCHC: 33.6 g/dL (ref 30.0–36.0)
MCV: 88.6 fL (ref 78.0–100.0)
MPV: 10.6 fL (ref 8.6–12.4)
Monocytes Absolute: 0.7 10*3/uL (ref 0.1–1.0)
Monocytes Relative: 6 % (ref 3–12)
Neutro Abs: 9.6 10*3/uL — ABNORMAL HIGH (ref 1.7–7.7)
Neutrophils Relative %: 79 % — ABNORMAL HIGH (ref 43–77)
PLATELETS: 258 10*3/uL (ref 150–400)
RBC: 4.3 MIL/uL (ref 3.87–5.11)
RDW: 14.5 % (ref 11.5–15.5)
RH TYPE: POSITIVE
RUBELLA: 12.5 {index} — AB (ref <0.90)
WBC: 12.1 10*3/uL — ABNORMAL HIGH (ref 4.0–10.5)

## 2015-03-03 LAB — URINE CYTOLOGY ANCILLARY ONLY
Chlamydia: NEGATIVE
Neisseria Gonorrhea: NEGATIVE

## 2015-03-04 LAB — CULTURE, OB URINE
Colony Count: NO GROWTH
Organism ID, Bacteria: NO GROWTH

## 2015-03-08 ENCOUNTER — Encounter: Payer: Self-pay | Admitting: *Deleted

## 2015-03-16 ENCOUNTER — Ambulatory Visit (INDEPENDENT_AMBULATORY_CARE_PROVIDER_SITE_OTHER): Payer: Self-pay | Admitting: Family Medicine

## 2015-03-16 VITALS — BP 119/77 | HR 94 | Wt 244.0 lb

## 2015-03-16 DIAGNOSIS — Z23 Encounter for immunization: Secondary | ICD-10-CM

## 2015-03-16 DIAGNOSIS — Z36 Encounter for antenatal screening of mother: Secondary | ICD-10-CM

## 2015-03-16 DIAGNOSIS — Z3483 Encounter for supervision of other normal pregnancy, third trimester: Secondary | ICD-10-CM

## 2015-03-16 DIAGNOSIS — Z3493 Encounter for supervision of normal pregnancy, unspecified, third trimester: Secondary | ICD-10-CM

## 2015-03-16 NOTE — Progress Notes (Signed)
Subjective:  Bonnie Mcneil is a 35 y.o. [phone] at [redacted]w[redacted]d being seen today for ongoing prenatal care.  She is currently monitored for the following issues for this low-risk pregnancy and has Supervision of normal pregnancy and Obesity complicating pregnancy on her problem list.  Patient reports no complaints.  Contractions: Not present. Vag. Bleeding: None.  Movement: Present. Denies leaking of fluid.   The following portions of the patient's history were reviewed and updated as appropriate: allergies, current medications, past family history, past medical history, past social history, past surgical history and problem list. Problem list updated.  Objective:   Filed Vitals:   03/16/15 1033  BP: 119/77  Pulse: 94  Weight: 244 lb (110.678 kg)    Fetal Status: Fetal Heart Rate (bpm): 143 Fundal Height: 31 cm Movement: Present     General:  Alert, oriented and cooperative. Patient is in no acute distress.  Skin: Skin is warm and dry. No rash noted.   Cardiovascular: Normal heart rate noted  Respiratory: Normal respiratory effort, no problems with respiration noted  Abdomen: Soft, gravid, appropriate for gestational age. Pain/Pressure: Present     Pelvic: Vag. Bleeding: None Vag D/C Character: Thin   Cervical exam deferred        Extremities: Normal range of motion.  Edema: None  Mental Status: Normal mood and affect. Normal behavior. Normal judgment and thought content.   Urinalysis: Urine Protein: Negative Urine Glucose: Negative  Assessment and Plan:  Pregnancy: G5P3013 at [redacted]w[redacted]d  1. Supervision of normal pregnancy in third trimester 28 wk labs today - Glucose Tolerance, 1 HR (50g) - Tdap vaccine greater than or equal to 7yo IM - Flu Vaccine QUAD 36+ mos IM (Fluarix, Quad PF)   Preterm labor symptoms and general obstetric precautions including but not limited to vaginal bleeding, contractions, leaking of fluid and fetal movement were reviewed in detail with the patient. Please  refer to After Visit Summary for other counseling recommendations.  Return in 2 weeks (on 03/30/2015).   Bonnie Bores, MD

## 2015-03-16 NOTE — Patient Instructions (Signed)
Third Trimester of Pregnancy The third trimester is from week 29 through week 42, months 7 through 9. The third trimester is a time when the fetus is growing rapidly. At the end of the ninth month, the fetus is about 20 inches in length and weighs 6-10 pounds.  BODY CHANGES Your body goes through many changes during pregnancy. The changes vary from woman to woman.   Your weight will continue to increase. You can expect to gain 25-35 pounds (11-16 kg) by the end of the pregnancy.  You may begin to get stretch marks on your hips, abdomen, and breasts.  You may urinate more often because the fetus is moving lower into your pelvis and pressing on your bladder.  You may develop or continue to have heartburn as a result of your pregnancy.  You may develop constipation because certain hormones are causing the muscles that push waste through your intestines to slow down.  You may develop hemorrhoids or swollen, bulging veins (varicose veins).  You may have pelvic pain because of the weight gain and pregnancy hormones relaxing your joints between the bones in your pelvis. Backaches may result from overexertion of the muscles supporting your posture.  You may have changes in your hair. These can include thickening of your hair, rapid growth, and changes in texture. Some women also have hair loss during or after pregnancy, or hair that feels dry or thin. Your hair will most likely return to normal after your baby is born.  Your breasts will continue to grow and be tender. A yellow discharge may leak from your breasts called colostrum.  Your belly button may stick out.  You may feel short of breath because of your expanding uterus.  You may notice the fetus "dropping," or moving lower in your abdomen.  You may have a bloody mucus discharge. This usually occurs a few days to a week before labor begins.  Your cervix becomes thin and soft (effaced) near your due date. WHAT TO EXPECT AT YOUR  PRENATAL EXAMS  You will have prenatal exams every 2 weeks until week 36. Then, you will have weekly prenatal exams. During a routine prenatal visit:  You will be weighed to make sure you and the fetus are growing normally.  Your blood pressure is taken.  Your abdomen will be measured to track your baby's growth.  The fetal heartbeat will be listened to.  Any test results from the previous visit will be discussed.  You may have a cervical check near your due date to see if you have effaced. At around 36 weeks, your caregiver will check your cervix. At the same time, your caregiver will also perform a test on the secretions of the vaginal tissue. This test is to determine if a type of bacteria, Group B streptococcus, is present. Your caregiver will explain this further. Your caregiver may ask you:  What your birth plan is.  How you are feeling.  If you are feeling the baby move.  If you have had any abnormal symptoms, such as leaking fluid, bleeding, severe headaches, or abdominal cramping.  If you are using any tobacco products, including cigarettes, chewing tobacco, and electronic cigarettes.  If you have any questions. Other tests or screenings that may be performed during your third trimester include:  Blood tests that check for low iron levels (anemia).  Fetal testing to check the health, activity level, and growth of the fetus. Testing is done if you have certain medical conditions or if   there are problems during the pregnancy.  HIV (human immunodeficiency virus) testing. If you are at high risk, you may be screened for HIV during your third trimester of pregnancy. FALSE LABOR You may feel small, irregular contractions that eventually go away. These are called Braxton Hicks contractions, or false labor. Contractions may last for hours, days, or even weeks before true labor sets in. If contractions come at regular intervals, intensify, or become painful, it is best to be seen  by your caregiver.  SIGNS OF LABOR   Menstrual-like cramps.  Contractions that are 5 minutes apart or less.  Contractions that start on the top of the uterus and spread down to the lower abdomen and back.  A sense of increased pelvic pressure or back pain.  A watery or bloody mucus discharge that comes from the vagina. If you have any of these signs before the 37th week of pregnancy, call your caregiver right away. You need to go to the hospital to get checked immediately. HOME CARE INSTRUCTIONS   Avoid all smoking, herbs, alcohol, and unprescribed drugs. These chemicals affect the formation and growth of the baby.  Do not use any tobacco products, including cigarettes, chewing tobacco, and electronic cigarettes. If you need help quitting, ask your health care provider. You may receive counseling support and other resources to help you quit.  Follow your caregiver's instructions regarding medicine use. There are medicines that are either safe or unsafe to take during pregnancy.  Exercise only as directed by your caregiver. Experiencing uterine cramps is a good sign to stop exercising.  Continue to eat regular, healthy meals.  Wear a good support bra for breast tenderness.  Do not use hot tubs, steam rooms, or saunas.  Wear your seat belt at all times when driving.  Avoid raw meat, uncooked cheese, cat litter boxes, and soil used by cats. These carry germs that can cause birth defects in the baby.  Take your prenatal vitamins.  Take 1500-2000 mg of calcium daily starting at the 20th week of pregnancy until you deliver your baby.  Try taking a stool softener (if your caregiver approves) if you develop constipation. Eat more high-fiber foods, such as fresh vegetables or fruit and whole grains. Drink plenty of fluids to keep your urine clear or pale yellow.  Take warm sitz baths to soothe any pain or discomfort caused by hemorrhoids. Use hemorrhoid cream if your caregiver  approves.  If you develop varicose veins, wear support hose. Elevate your feet for 15 minutes, 3-4 times a day. Limit salt in your diet.  Avoid heavy lifting, wear low heal shoes, and practice good posture.  Rest a lot with your legs elevated if you have leg cramps or low back pain.  Visit your dentist if you have not gone during your pregnancy. Use a soft toothbrush to brush your teeth and be gentle when you floss.  A sexual relationship may be continued unless your caregiver directs you otherwise.  Do not travel far distances unless it is absolutely necessary and only with the approval of your caregiver.  Take prenatal classes to understand, practice, and ask questions about the labor and delivery.  Make a trial run to the hospital.  Pack your hospital bag.  Prepare the baby's nursery.  Continue to go to all your prenatal visits as directed by your caregiver. SEEK MEDICAL CARE IF:  You are unsure if you are in labor or if your water has broken.  You have dizziness.  You have   mild pelvic cramps, pelvic pressure, or nagging pain in your abdominal area.  You have persistent nausea, vomiting, or diarrhea.  You have a bad smelling vaginal discharge.  You have pain with urination. SEEK IMMEDIATE MEDICAL CARE IF:   You have a fever.  You are leaking fluid from your vagina.  You have spotting or bleeding from your vagina.  You have severe abdominal cramping or pain.  You have rapid weight loss or gain.  You have shortness of breath with chest pain.  You notice sudden or extreme swelling of your face, hands, ankles, feet, or legs.  You have not felt your baby move in over an hour.  You have severe headaches that do not go away with medicine.  You have vision changes.   This information is not intended to replace advice given to you by your health care provider. Make sure you discuss any questions you have with your health care provider.   Document Released:  01/24/2001 Document Revised: 02/20/2014 Document Reviewed: 04/02/2012 Elsevier Interactive Patient Education 2016 [employer]  Breastfeeding Deciding to breastfeed is one of the best choices you can make for you and your baby. A change in hormones during pregnancy causes your breast tissue to grow and increases the number and size of your milk ducts. These hormones also allow proteins, sugars, and fats from your blood supply to make breast milk in your milk-producing glands. Hormones prevent breast milk from being released before your baby is born as well as prompt milk flow after birth. Once breastfeeding has begun, thoughts of your baby, as well as his or her sucking or crying, can stimulate the release of milk from your milk-producing glands.  BENEFITS OF BREASTFEEDING For Your Baby  Your first milk (colostrum) helps your baby's digestive system function better.  There are antibodies in your milk that help your baby fight off infections.  Your baby has a lower incidence of asthma, allergies, and sudden infant death syndrome.  The nutrients in breast milk are better for your baby than infant formulas and are designed uniquely for your baby's needs.  Breast milk improves your baby's brain development.  Your baby is less likely to develop other conditions, such as childhood obesity, asthma, or type 2 diabetes mellitus. For You  Breastfeeding helps to create a very special bond between you and your baby.  Breastfeeding is convenient. Breast milk is always available at the correct temperature and costs nothing.  Breastfeeding helps to burn calories and helps you lose the weight gained during pregnancy.  Breastfeeding makes your uterus contract to its prepregnancy size faster and slows bleeding (lochia) after you give birth.   Breastfeeding helps to lower your risk of developing type 2 diabetes mellitus, osteoporosis, and breast or ovarian cancer later in life. SIGNS THAT YOUR BABY IS  HUNGRY Early Signs of Hunger  Increased alertness or activity.  Stretching.  Movement of the head from side to side.  Movement of the head and opening of the mouth when the corner of the mouth or cheek is stroked (rooting).  Increased sucking sounds, smacking lips, cooing, sighing, or squeaking.  Hand-to-mouth movements.  Increased sucking of fingers or hands. Late Signs of Hunger  Fussing.  Intermittent crying. Extreme Signs of Hunger Signs of extreme hunger will require calming and consoling before your baby will be able to breastfeed successfully. Do not wait for the following signs of extreme hunger to occur before you initiate breastfeeding:  Restlessness.  A loud, strong cry.  Screaming.   BREASTFEEDING BASICS Breastfeeding Initiation  Find a comfortable place to sit or lie down, with your neck and back well supported.  Place a pillow or rolled up blanket under your baby to bring him or her to the level of your breast (if you are seated). Nursing pillows are specially designed to help support your arms and your baby while you breastfeed.  Make sure that your baby's abdomen is facing your abdomen.  Gently massage your breast. With your fingertips, massage from your chest wall toward your nipple in a circular motion. This encourages milk flow. You may need to continue this action during the feeding if your milk flows slowly.  Support your breast with 4 fingers underneath and your thumb above your nipple. Make sure your fingers are well away from your nipple and your baby's mouth.  Stroke your baby's lips gently with your finger or nipple.  When your baby's mouth is open wide enough, quickly bring your baby to your breast, placing your entire nipple and as much of the colored area around your nipple (areola) as possible into your baby's mouth.  More areola should be visible above your baby's upper lip than below the lower lip.  Your baby's tongue should be between his  or her lower gum and your breast.  Ensure that your baby's mouth is correctly positioned around your nipple (latched). Your baby's lips should create a seal on your breast and be turned out (everted).  It is common for your baby to suck about 2-3 minutes in order to start the flow of breast milk. Latching Teaching your baby how to latch on to your breast properly is very important. An improper latch can cause nipple pain and decreased milk supply for you and poor weight gain in your baby. Also, if your baby is not latched onto your nipple properly, he or she may swallow some air during feeding. This can make your baby fussy. Burping your baby when you switch breasts during the feeding can help to get rid of the air. However, teaching your baby to latch on properly is still the best way to prevent fussiness from swallowing air while breastfeeding. Signs that your baby has successfully latched on to your nipple:  Silent tugging or silent sucking, without causing you pain.  Swallowing heard between every 3-4 sucks.  Muscle movement above and in front of his or her ears while sucking. Signs that your baby has not successfully latched on to nipple:  Sucking sounds or smacking sounds from your baby while breastfeeding.  Nipple pain. If you think your baby has not latched on correctly, slip your finger into the corner of your baby's mouth to break the suction and place it between your baby's gums. Attempt breastfeeding initiation again. Signs of Successful Breastfeeding Signs from your baby:  A gradual decrease in the number of sucks or complete cessation of sucking.  Falling asleep.  Relaxation of his or her body.  Retention of a small amount of milk in his or her mouth.  Letting go of your breast by himself or herself. Signs from you:  Breasts that have increased in firmness, weight, and size 1-3 hours after feeding.  Breasts that are softer immediately after  breastfeeding.  Increased milk volume, as well as a change in milk consistency and color by the fifth day of breastfeeding.  Nipples that are not sore, cracked, or bleeding. Signs That Your Baby is Getting Enough Milk  Wetting at least 3 diapers in a 24-hour period.   The urine should be clear and pale yellow by age 5 days.  At least 3 stools in a 24-hour period by age 5 days. The stool should be soft and yellow.  At least 3 stools in a 24-hour period by age 7 days. The stool should be seedy and yellow.  No loss of weight greater than 10% of birth weight during the first 3 days of age.  Average weight gain of 4-7 ounces (113-198 g) per week after age 4 days.  Consistent daily weight gain by age 5 days, without weight loss after the age of 2 weeks. After a feeding, your baby may spit up a small amount. This is common. BREASTFEEDING FREQUENCY AND DURATION Frequent feeding will help you make more milk and can prevent sore nipples and breast engorgement. Breastfeed when you feel the need to reduce the fullness of your breasts or when your baby shows signs of hunger. This is called "breastfeeding on demand." Avoid introducing a pacifier to your baby while you are working to establish breastfeeding (the first 4-6 weeks after your baby is born). After this time you may choose to use a pacifier. Research has shown that pacifier use during the first year of a baby's life decreases the risk of sudden infant death syndrome (SIDS). Allow your baby to feed on each breast as long as he or she wants. Breastfeed until your baby is finished feeding. When your baby unlatches or falls asleep while feeding from the first breast, offer the second breast. Because newborns are often sleepy in the first few weeks of life, you may need to awaken your baby to get him or her to feed. Breastfeeding times will vary from baby to baby. However, the following rules can serve as a guide to help you ensure that your baby is  properly fed:  Newborns (babies 4 weeks of age or younger) may breastfeed every 1-3 hours.  Newborns should not go longer than 3 hours during the day or 5 hours during the night without breastfeeding.  You should breastfeed your baby a minimum of 8 times in a 24-hour period until you begin to introduce solid foods to your baby at around 6 months of age. BREAST MILK PUMPING Pumping and storing breast milk allows you to ensure that your baby is exclusively fed your breast milk, even at times when you are unable to breastfeed. This is especially important if you are going back to work while you are still breastfeeding or when you are not able to be present during feedings. Your lactation consultant can give you guidelines on how long it is safe to store breast milk. A breast pump is a machine that allows you to pump milk from your breast into a sterile bottle. The pumped breast milk can then be stored in a refrigerator or freezer. Some breast pumps are operated by hand, while others use electricity. Ask your lactation consultant which type will work best for you. Breast pumps can be purchased, but some hospitals and breastfeeding support groups lease breast pumps on a monthly basis. A lactation consultant can teach you how to hand express breast milk, if you prefer not to use a pump. CARING FOR YOUR BREASTS WHILE YOU BREASTFEED Nipples can become dry, cracked, and sore while breastfeeding. The following recommendations can help keep your breasts moisturized and healthy:  Avoid using soap on your nipples.  Wear a supportive bra. Although not required, special nursing bras and tank tops are designed to allow access to your   breasts for breastfeeding without taking off your entire bra or top. Avoid wearing underwire-style bras or extremely tight bras.  Air dry your nipples for 3-4minutes after each feeding.  Use only cotton bra pads to absorb leaked breast milk. Leaking of breast milk between feedings  is normal.  Use lanolin on your nipples after breastfeeding. Lanolin helps to maintain your skin's normal moisture barrier. If you use pure lanolin, you do not need to wash it off before feeding your baby again. Pure lanolin is not toxic to your baby. You may also hand express a few drops of breast milk and gently massage that milk into your nipples and allow the milk to air dry. In the first few weeks after giving birth, some women experience extremely full breasts (engorgement). Engorgement can make your breasts feel heavy, warm, and tender to the touch. Engorgement peaks within 3-5 days after you give birth. The following recommendations can help ease engorgement:  Completely empty your breasts while breastfeeding or pumping. You may want to start by applying warm, moist heat (in the shower or with warm water-soaked hand towels) just before feeding or pumping. This increases circulation and helps the milk flow. If your baby does not completely empty your breasts while breastfeeding, pump any extra milk after he or she is finished.  Wear a snug bra (nursing or regular) or tank top for 1-2 days to signal your body to slightly decrease milk production.  Apply ice packs to your breasts, unless this is too uncomfortable for you.  Make sure that your baby is latched on and positioned properly while breastfeeding. If engorgement persists after 48 hours of following these recommendations, contact your health care provider or a lactation consultant. OVERALL HEALTH CARE RECOMMENDATIONS WHILE BREASTFEEDING  Eat healthy foods. Alternate between meals and snacks, eating 3 of each per day. Because what you eat affects your breast milk, some of the foods may make your baby more irritable than usual. Avoid eating these foods if you are sure that they are negatively affecting your baby.  Drink milk, fruit juice, and water to satisfy your thirst (about 10 glasses a day).  Rest often, relax, and continue to take  your prenatal vitamins to prevent fatigue, stress, and anemia.  Continue breast self-awareness checks.  Avoid chewing and smoking tobacco. Chemicals from cigarettes that pass into breast milk and exposure to secondhand smoke may harm your baby.  Avoid alcohol and drug use, including marijuana. Some medicines that may be harmful to your baby can pass through breast milk. It is important to ask your health care provider before taking any medicine, including all over-the-counter and prescription medicine as well as vitamin and herbal supplements. It is possible to become pregnant while breastfeeding. If birth control is desired, ask your health care provider about options that will be safe for your baby. SEEK MEDICAL CARE IF:  You feel like you want to stop breastfeeding or have become frustrated with breastfeeding.  You have painful breasts or nipples.  Your nipples are cracked or bleeding.  Your breasts are red, tender, or warm.  You have a swollen area on either breast.  You have a fever or chills.  You have nausea or vomiting.  You have drainage other than breast milk from your nipples.  Your breasts do not become full before feedings by the fifth day after you give birth.  You feel sad and depressed.  Your baby is too sleepy to eat well.  Your baby is having trouble sleeping.     Your baby is wetting less than 3 diapers in a 24-hour period.  Your baby has less than 3 stools in a 24-hour period.  Your baby's skin or the white part of his or her eyes becomes yellow.   Your baby is not gaining weight by 5 days of age. SEEK IMMEDIATE MEDICAL CARE IF:  Your baby is overly tired (lethargic) and does not want to wake up and feed.  Your baby develops an unexplained fever.   This information is not intended to replace advice given to you by your health care provider. Make sure you discuss any questions you have with your health care provider.   Document Released: 01/30/2005  Document Revised: 10/21/2014 Document Reviewed: 07/24/2012 Elsevier Interactive Patient Education 2016 [employer]  

## 2015-03-17 LAB — GLUCOSE TOLERANCE, 1 HOUR (50G) W/O FASTING: GLUCOSE 1 HOUR GTT: 104 mg/dL (ref 70–140)

## 2015-03-30 ENCOUNTER — Encounter: Payer: Self-pay | Admitting: Physician Assistant

## 2015-03-30 ENCOUNTER — Ambulatory Visit (INDEPENDENT_AMBULATORY_CARE_PROVIDER_SITE_OTHER): Payer: Self-pay | Admitting: Physician Assistant

## 2015-03-30 VITALS — BP 120/77 | HR 88 | Wt 249.0 lb

## 2015-03-30 DIAGNOSIS — Z3493 Encounter for supervision of normal pregnancy, unspecified, third trimester: Secondary | ICD-10-CM

## 2015-03-30 DIAGNOSIS — Z3483 Encounter for supervision of other normal pregnancy, third trimester: Secondary | ICD-10-CM

## 2015-03-30 NOTE — Progress Notes (Signed)
Subjective:  Bonnie Mcneil is a 35 y.o. [phone] at [redacted]w[redacted]d being seen today for ongoing prenatal care.  Patient reports no complaints.  Contractions: Not present.  Vag. Bleeding: None. Movement: Present. Denies leaking of fluid.   The following portions of the patient's history were reviewed and updated as appropriate: allergies, current medications, past family history, past medical history, past social history, past surgical history and problem list.   Objective:   Filed Vitals:   03/30/15 0928  BP: 120/77  Pulse: 88  Weight: 249 lb (112.946 kg)    Fetal Status: Fetal Heart Rate (bpm): 153   Movement: Present     General:  Alert, oriented and cooperative. Patient is in no acute distress.  Skin: Skin is warm and dry. No rash noted.   Cardiovascular: Normal heart rate noted  Respiratory: Normal respiratory effort, no problems with respiration noted  Abdomen: Soft, gravid, appropriate for gestational age. Pain/Pressure: Present     Pelvic: Vag. Bleeding: None Vag D/C Character: Thin   Cervical exam deferred        Extremities: Normal range of motion.  Edema: Trace  Mental Status: Normal mood and affect. Normal behavior. Normal judgment and thought content.   Urinalysis: Urine Protein: Negative Urine Glucose: Negative  Assessment and Plan:  Pregnancy: [id number] at [redacted]w[redacted]d  1. Supervision of normal pregnancy, third trimester Maternity Support Belt advised  Preterm labor symptoms and general obstetric precautions including but not limited to vaginal bleeding, contractions, leaking of fluid and fetal movement were reviewed in detail with the patient. Please refer to After Visit Summary for other counseling recommendations.  Return in about 2 weeks (around 04/13/2015) for OBF.   Bertram Denver, PA-C

## 2015-03-30 NOTE — Patient Instructions (Signed)

## 2015-04-13 ENCOUNTER — Ambulatory Visit (INDEPENDENT_AMBULATORY_CARE_PROVIDER_SITE_OTHER): Payer: Self-pay | Admitting: Obstetrics & Gynecology

## 2015-04-13 VITALS — BP 109/70 | HR 71 | Wt 251.0 lb

## 2015-04-13 DIAGNOSIS — Z3493 Encounter for supervision of normal pregnancy, unspecified, third trimester: Secondary | ICD-10-CM

## 2015-04-13 DIAGNOSIS — Z3483 Encounter for supervision of other normal pregnancy, third trimester: Secondary | ICD-10-CM

## 2015-04-13 NOTE — Patient Instructions (Signed)
Return to clinic for any obstetric concerns or go to [facility] for evaluation Third Trimester of Pregnancy The third trimester is from week 29 through week 42, months 7 through 9. The third trimester is a time when the fetus is growing rapidly. At the end of the ninth month, the fetus is about 20 inches in length and weighs 6-10 pounds.  BODY CHANGES Your body goes through many changes during pregnancy. The changes vary from woman to woman.   Your weight will continue to increase. You can expect to gain 25-35 pounds (11-16 kg) by the end of the pregnancy.  You may begin to get stretch marks on your hips, abdomen, and breasts.  You may urinate more often because the fetus is moving lower into your pelvis and pressing on your bladder.  You may develop or continue to have heartburn as a result of your pregnancy.  You may develop constipation because certain hormones are causing the muscles that push waste through your intestines to slow down.  You may develop hemorrhoids or swollen, bulging veins (varicose veins).  You may have pelvic pain because of the weight gain and pregnancy hormones relaxing your joints between the bones in your pelvis. Backaches may result from overexertion of the muscles supporting your posture.  You may have changes in your hair. These can include thickening of your hair, rapid growth, and changes in texture. Some women also have hair loss during or after pregnancy, or hair that feels dry or thin. Your hair will most likely return to normal after your baby is born.  Your breasts will continue to grow and be tender. A yellow discharge may leak from your breasts called colostrum.  Your belly button may stick out.  You may feel short of breath because of your expanding uterus.  You may notice the fetus "dropping," or moving lower in your abdomen.  You may have a bloody mucus discharge. This usually occurs a few days to a week before labor begins.  Your cervix becomes  thin and soft (effaced) near your due date. WHAT TO EXPECT AT YOUR PRENATAL EXAMS  You will have prenatal exams every 2 weeks until week 36. Then, you will have weekly prenatal exams. During a routine prenatal visit:  You will be weighed to make sure you and the fetus are growing normally.  Your blood pressure is taken.  Your abdomen will be measured to track your baby's growth.  The fetal heartbeat will be listened to.  Any test results from the previous visit will be discussed.  You may have a cervical check near your due date to see if you have effaced. At around 36 weeks, your caregiver will check your cervix. At the same time, your caregiver will also perform a test on the secretions of the vaginal tissue. This test is to determine if a type of bacteria, Group B streptococcus, is present. Your caregiver will explain this further. Your caregiver may ask you:  What your birth plan is.  How you are feeling.  If you are feeling the baby move.  If you have had any abnormal symptoms, such as leaking fluid, bleeding, severe headaches, or abdominal cramping.  If you are using any tobacco products, including cigarettes, chewing tobacco, and electronic cigarettes.  If you have any questions. Other tests or screenings that may be performed during your third trimester include:  Blood tests that check for low iron levels (anemia).  Fetal testing to check the health, activity level, and growth of  the fetus. Testing is done if you have certain medical conditions or if there are problems during the pregnancy.  HIV (human immunodeficiency virus) testing. If you are at high risk, you may be screened for HIV during your third trimester of pregnancy. FALSE LABOR You may feel small, irregular contractions that eventually go away. These are called Braxton Hicks contractions, or false labor. Contractions may last for hours, days, or even weeks before true labor sets in. If contractions come at  regular intervals, intensify, or become painful, it is best to be seen by your caregiver.  SIGNS OF LABOR   Menstrual-like cramps.  Contractions that are 5 minutes apart or less.  Contractions that start on the top of the uterus and spread down to the lower abdomen and back.  A sense of increased pelvic pressure or back pain.  A watery or bloody mucus discharge that comes from the vagina. If you have any of these signs before the 37th week of pregnancy, call your caregiver right away. You need to go to the hospital to get checked immediately. HOME CARE INSTRUCTIONS   Avoid all smoking, herbs, alcohol, and unprescribed drugs. These chemicals affect the formation and growth of the baby.  Do not use any tobacco products, including cigarettes, chewing tobacco, and electronic cigarettes. If you need help quitting, ask your health care provider. You may receive counseling support and other resources to help you quit.  Follow your caregiver's instructions regarding medicine use. There are medicines that are either safe or unsafe to take during pregnancy.  Exercise only as directed by your caregiver. Experiencing uterine cramps is a good sign to stop exercising.  Continue to eat regular, healthy meals.  Wear a good support bra for breast tenderness.  Do not use hot tubs, steam rooms, or saunas.  Wear your seat belt at all times when driving.  Avoid raw meat, uncooked cheese, cat litter boxes, and soil used by cats. These carry germs that can cause birth defects in the baby.  Take your prenatal vitamins.  Take 1500-2000 mg of calcium daily starting at the 20th week of pregnancy until you deliver your baby.  Try taking a stool softener (if your caregiver approves) if you develop constipation. Eat more high-fiber foods, such as fresh vegetables or fruit and whole grains. Drink plenty of fluids to keep your urine clear or pale yellow.  Take warm sitz baths to soothe any pain or discomfort  caused by hemorrhoids. Use hemorrhoid cream if your caregiver approves.  If you develop varicose veins, wear support hose. Elevate your feet for 15 minutes, 3-4 times a day. Limit salt in your diet.  Avoid heavy lifting, wear low heal shoes, and practice good posture.  Rest a lot with your legs elevated if you have leg cramps or low back pain.  Visit your dentist if you have not gone during your pregnancy. Use a soft toothbrush to brush your teeth and be gentle when you floss.  A sexual relationship may be continued unless your caregiver directs you otherwise.  Do not travel far distances unless it is absolutely necessary and only with the approval of your caregiver.  Take prenatal classes to understand, practice, and ask questions about the labor and delivery.  Make a trial run to the hospital.  Pack your hospital bag.  Prepare the baby's nursery.  Continue to go to all your prenatal visits as directed by your caregiver. SEEK MEDICAL CARE IF:  You are unsure if you are in labor  or if your water has broken.  You have dizziness.  You have mild pelvic cramps, pelvic pressure, or nagging pain in your abdominal area.  You have persistent nausea, vomiting, or diarrhea.  You have a bad smelling vaginal discharge.  You have pain with urination. SEEK IMMEDIATE MEDICAL CARE IF:   You have a fever.  You are leaking fluid from your vagina.  You have spotting or bleeding from your vagina.  You have severe abdominal cramping or pain.  You have rapid weight loss or gain.  You have shortness of breath with chest pain.  You notice sudden or extreme swelling of your face, hands, ankles, feet, or legs.  You have not felt your baby move in over an hour.  You have severe headaches that do not go away with medicine.  You have vision changes.   This information is not intended to replace advice given to you by your health care provider. Make sure you discuss any questions you have  with your health care provider. Document Released: 01/24/2001 Document Revised: 02/20/2014 Document Reviewed: 04/02/2012 Elsevier Interactive Patient Education [employer]  Breastfeeding Deciding to breastfeed is one of the best choices you can make for you and your baby. A change in hormones during pregnancy causes your breast tissue to grow and increases the number and size of your milk ducts. These hormones also allow proteins, sugars, and fats from your blood supply to make breast milk in your milk-producing glands. Hormones prevent breast milk from being released before your baby is born as well as prompt milk flow after birth. Once breastfeeding has begun, thoughts of your baby, as well as his or her sucking or crying, can stimulate the release of milk from your milk-producing glands.  BENEFITS OF BREASTFEEDING For Your Baby  Your first milk (colostrum) helps your baby's digestive system function better.  There are antibodies in your milk that help your baby fight off infections.  Your baby has a lower incidence of asthma, allergies, and sudden infant death syndrome.  The nutrients in breast milk are better for your baby than infant formulas and are designed uniquely for your baby's needs.  Breast milk improves your baby's brain development.  Your baby is less likely to develop other conditions, such as childhood obesity, asthma, or type 2 diabetes mellitus. For You  Breastfeeding helps to create a very special bond between you and your baby.  Breastfeeding is convenient. Breast milk is always available at the correct temperature and costs nothing.  Breastfeeding helps to burn calories and helps you lose the weight gained during pregnancy.  Breastfeeding makes your uterus contract to its prepregnancy size faster and slows bleeding (lochia) after you give birth.   Breastfeeding helps to lower your risk of developing type 2 diabetes mellitus, osteoporosis, and breast or  ovarian cancer later in life. SIGNS THAT YOUR BABY IS HUNGRY Early Signs of Hunger  Increased alertness or activity.  Stretching.  Movement of the head from side to side.  Movement of the head and opening of the mouth when the corner of the mouth or cheek is stroked (rooting).  Increased sucking sounds, smacking lips, cooing, sighing, or squeaking.  Hand-to-mouth movements.  Increased sucking of fingers or hands. Late Signs of Hunger  Fussing.  Intermittent crying. Extreme Signs of Hunger Signs of extreme hunger will require calming and consoling before your baby will be able to breastfeed successfully. Do not wait for the following signs of extreme hunger to occur before you  initiate breastfeeding:  Restlessness.  A loud, strong cry.  Screaming. BREASTFEEDING BASICS Breastfeeding Initiation  Find a comfortable place to sit or lie down, with your neck and back well supported.  Place a pillow or rolled up blanket under your baby to bring him or her to the level of your breast (if you are seated). Nursing pillows are specially designed to help support your arms and your baby while you breastfeed.  Make sure that your baby's abdomen is facing your abdomen.  Gently massage your breast. With your fingertips, massage from your chest wall toward your nipple in a circular motion. This encourages milk flow. You may need to continue this action during the feeding if your milk flows slowly.  Support your breast with 4 fingers underneath and your thumb above your nipple. Make sure your fingers are well away from your nipple and your baby's mouth.  Stroke your baby's lips gently with your finger or nipple.  When your baby's mouth is open wide enough, quickly bring your baby to your breast, placing your entire nipple and as much of the colored area around your nipple (areola) as possible into your baby's mouth.  More areola should be visible above your baby's upper lip than below the  lower lip.  Your baby's tongue should be between his or her lower gum and your breast.  Ensure that your baby's mouth is correctly positioned around your nipple (latched). Your baby's lips should create a seal on your breast and be turned out (everted).  It is common for your baby to suck about 2-3 minutes in order to start the flow of breast milk. Latching Teaching your baby how to latch on to your breast properly is very important. An improper latch can cause nipple pain and decreased milk supply for you and poor weight gain in your baby. Also, if your baby is not latched onto your nipple properly, he or she may swallow some air during feeding. This can make your baby fussy. Burping your baby when you switch breasts during the feeding can help to get rid of the air. However, teaching your baby to latch on properly is still the best way to prevent fussiness from swallowing air while breastfeeding. Signs that your baby has successfully latched on to your nipple:  Silent tugging or silent sucking, without causing you pain.  Swallowing heard between every 3-4 sucks.  Muscle movement above and in front of his or her ears while sucking. Signs that your baby has not successfully latched on to nipple:  Sucking sounds or smacking sounds from your baby while breastfeeding.  Nipple pain. If you think your baby has not latched on correctly, slip your finger into the corner of your baby's mouth to break the suction and place it between your baby's gums. Attempt breastfeeding initiation again. Signs of Successful Breastfeeding Signs from your baby:  A gradual decrease in the number of sucks or complete cessation of sucking.  Falling asleep.  Relaxation of his or her body.  Retention of a small amount of milk in his or her mouth.  Letting go of your breast by himself or herself. Signs from you:  Breasts that have increased in firmness, weight, and size 1-3 hours after feeding.  Breasts that  are softer immediately after breastfeeding.  Increased milk volume, as well as a change in milk consistency and color by the fifth day of breastfeeding.  Nipples that are not sore, cracked, or bleeding. Signs That Your Pecola Leisure is Getting Enough  Milk  Wetting at least 3 diapers in a 24-hour period. The urine should be clear and pale yellow by age 12 days.  At least 3 stools in a 24-hour period by age 12 days. The stool should be soft and yellow.  At least 3 stools in a 24-hour period by age 159 days. The stool should be seedy and yellow.  No loss of weight greater than 10% of birth weight during the first 17 days of age.  Average weight gain of 4-7 ounces (113-198 g) per week after age 7 days.  Consistent daily weight gain by age 12 days, without weight loss after the age of 2 weeks. After a feeding, your baby may spit up a small amount. This is common. BREASTFEEDING FREQUENCY AND DURATION Frequent feeding will help you make more milk and can prevent sore nipples and breast engorgement. Breastfeed when you feel the need to reduce the fullness of your breasts or when your baby shows signs of hunger. This is called "breastfeeding on demand." Avoid introducing a pacifier to your baby while you are working to establish breastfeeding (the first 4-6 weeks after your baby is born). After this time you may choose to use a pacifier. Research has shown that pacifier use during the first year of a baby's life decreases the risk of sudden infant death syndrome (SIDS). Allow your baby to feed on each breast as long as he or she wants. Breastfeed until your baby is finished feeding. When your baby unlatches or falls asleep while feeding from the first breast, offer the second breast. Because newborns are often sleepy in the first few weeks of life, you may need to awaken your baby to get him or her to feed. Breastfeeding times will vary from baby to baby. However, the following rules can serve as a guide to help you  ensure that your baby is properly fed:  Newborns (babies 49 weeks of age or younger) may breastfeed every 1-3 hours.  Newborns should not go longer than 3 hours during the day or 5 hours during the night without breastfeeding.  You should breastfeed your baby a minimum of 8 times in a 24-hour period until you begin to introduce solid foods to your baby at around 64 months of age. BREAST MILK PUMPING Pumping and storing breast milk allows you to ensure that your baby is exclusively fed your breast milk, even at times when you are unable to breastfeed. This is especially important if you are going back to work while you are still breastfeeding or when you are not able to be present during feedings. Your lactation consultant can give you guidelines on how long it is safe to store breast milk. A breast pump is a machine that allows you to pump milk from your breast into a sterile bottle. The pumped breast milk can then be stored in a refrigerator or freezer. Some breast pumps are operated by hand, while others use electricity. Ask your lactation consultant which type will work best for you. Breast pumps can be purchased, but some hospitals and breastfeeding support groups lease breast pumps on a monthly basis. A lactation consultant can teach you how to hand express breast milk, if you prefer not to use a pump. CARING FOR YOUR BREASTS WHILE YOU BREASTFEED Nipples can become dry, cracked, and sore while breastfeeding. The following recommendations can help keep your breasts moisturized and healthy:  Avoid using soap on your nipples.  Wear a supportive bra. Although not required, special nursing  bras and tank tops are designed to allow access to your breasts for breastfeeding without taking off your entire bra or top. Avoid wearing underwire-style bras or extremely tight bras.  Air dry your nipples for 3-34minutes after each feeding.  Use only cotton bra pads to absorb leaked breast milk. Leaking of  breast milk between feedings is normal.  Use lanolin on your nipples after breastfeeding. Lanolin helps to maintain your skin's normal moisture barrier. If you use pure lanolin, you do not need to wash it off before feeding your baby again. Pure lanolin is not toxic to your baby. You may also hand express a few drops of breast milk and gently massage that milk into your nipples and allow the milk to air dry. In the first few weeks after giving birth, some women experience extremely full breasts (engorgement). Engorgement can make your breasts feel heavy, warm, and tender to the touch. Engorgement peaks within 3-5 days after you give birth. The following recommendations can help ease engorgement:  Completely empty your breasts while breastfeeding or pumping. You may want to start by applying warm, moist heat (in the shower or with warm water-soaked hand towels) just before feeding or pumping. This increases circulation and helps the milk flow. If your baby does not completely empty your breasts while breastfeeding, pump any extra milk after he or she is finished.  Wear a snug bra (nursing or regular) or tank top for 1-2 days to signal your body to slightly decrease milk production.  Apply ice packs to your breasts, unless this is too uncomfortable for you.  Make sure that your baby is latched on and positioned properly while breastfeeding. If engorgement persists after 48 hours of following these recommendations, contact your health care provider or a Advertising copywriter. OVERALL HEALTH CARE RECOMMENDATIONS WHILE BREASTFEEDING  Eat healthy foods. Alternate between meals and snacks, eating 3 of each per day. Because what you eat affects your breast milk, some of the foods may make your baby more irritable than usual. Avoid eating these foods if you are sure that they are negatively affecting your baby.  Drink milk, fruit juice, and water to satisfy your thirst (about 10 glasses a day).  Rest often,  relax, and continue to take your prenatal vitamins to prevent fatigue, stress, and anemia.  Continue breast self-awareness checks.  Avoid chewing and smoking tobacco. Chemicals from cigarettes that pass into breast milk and exposure to secondhand smoke may harm your baby.  Avoid alcohol and drug use, including marijuana. Some medicines that may be harmful to your baby can pass through breast milk. It is important to ask your health care provider before taking any medicine, including all over-the-counter and prescription medicine as well as vitamin and herbal supplements. It is possible to become pregnant while breastfeeding. If birth control is desired, ask your health care provider about options that will be safe for your baby. SEEK MEDICAL CARE IF:  You feel like you want to stop breastfeeding or have become frustrated with breastfeeding.  You have painful breasts or nipples.  Your nipples are cracked or bleeding.  Your breasts are red, tender, or warm.  You have a swollen area on either breast.  You have a fever or chills.  You have nausea or vomiting.  You have drainage other than breast milk from your nipples.  Your breasts do not become full before feedings by the fifth day after you give birth.  You feel sad and depressed.  Your baby is too  sleepy to eat well.  Your baby is having trouble sleeping.   Your baby is wetting less than 3 diapers in a 24-hour period.  Your baby has less than 3 stools in a 24-hour period.  Your baby's skin or the white part of his or her eyes becomes yellow.   Your baby is not gaining weight by 75 days of age. SEEK IMMEDIATE MEDICAL CARE IF:  Your baby is overly tired (lethargic) and does not want to wake up and feed.  Your baby develops an unexplained fever.   This information is not intended to replace advice given to you by your health care provider. Make sure you discuss any questions you have with your health care provider.     Document Released: 01/30/2005 Document Revised: 10/21/2014 Document Reviewed: 07/24/2012 Elsevier Interactive Patient Education [employer]

## 2015-04-13 NOTE — Progress Notes (Signed)
Subjective:  Bonnie Mcneil is a 35 y.o. [phone] at [redacted]w[redacted]d being seen today for ongoing prenatal care.  She is currently monitored for the following issues for this low-risk pregnancy and has Supervision of normal pregnancy and Obesity complicating pregnancy on her problem list.  Patient reports no complaints.  Contractions: Not present. Vag. Bleeding: None.  Movement: Present. Denies leaking of fluid.   The following portions of the patient's history were reviewed and updated as appropriate: allergies, current medications, past family history, past medical history, past social history, past surgical history and problem list. Problem list updated.  Objective:   Filed Vitals:   04/13/15 0850  BP: 109/70  Pulse: 71  Weight: 251 lb (113.853 kg)    Fetal Status: Fetal Heart Rate (bpm): 139 Fundal Height: 35 cm Movement: Present     General:  Alert, oriented and cooperative. Patient is in no acute distress.  Skin: Skin is warm and dry. No rash noted.   Cardiovascular: Normal heart rate noted  Respiratory: Normal respiratory effort, no problems with respiration noted  Abdomen: Soft, gravid, appropriate for gestational age. Pain/Pressure: Present     Pelvic: Vag. Bleeding: None Vag D/C Character: Thin   Cervical exam deferred        Extremities: Normal range of motion.  Edema: None  Mental Status: Normal mood and affect. Normal behavior. Normal judgment and thought content.   Urinalysis: Urine Protein: Negative Urine Glucose: Negative  Assessment and Plan:  Pregnancy: [id number] at [redacted]w[redacted]d  1. Supervision of normal pregnancy, third trimester Preterm labor symptoms and general obstetric precautions including but not limited to vaginal bleeding, contractions, leaking of fluid and fetal movement were reviewed in detail with the patient. Please refer to After Visit Summary for other counseling recommendations.  Return in about 2 weeks (around 04/27/2015) for OB Visit.   Tereso Newcomer,  MD

## 2015-04-27 ENCOUNTER — Ambulatory Visit (INDEPENDENT_AMBULATORY_CARE_PROVIDER_SITE_OTHER): Payer: Self-pay | Admitting: Family Medicine

## 2015-04-27 ENCOUNTER — Encounter: Payer: Self-pay | Admitting: Family Medicine

## 2015-04-27 VITALS — BP 133/81 | HR 97 | Wt 247.0 lb

## 2015-04-27 DIAGNOSIS — Z3483 Encounter for supervision of other normal pregnancy, third trimester: Secondary | ICD-10-CM

## 2015-04-27 DIAGNOSIS — Z3493 Encounter for supervision of normal pregnancy, unspecified, third trimester: Secondary | ICD-10-CM

## 2015-04-27 NOTE — Patient Instructions (Signed)
Third Trimester of Pregnancy The third trimester is from week 29 through week 42, months 7 through 9. The third trimester is a time when the fetus is growing rapidly. At the end of the ninth month, the fetus is about 20 inches in length and weighs 6-10 pounds.  BODY CHANGES Your body goes through many changes during pregnancy. The changes vary from woman to woman.   Your weight will continue to increase. You can expect to gain 25-35 pounds (11-16 kg) by the end of the pregnancy.  You may begin to get stretch marks on your hips, abdomen, and breasts.  You may urinate more often because the fetus is moving lower into your pelvis and pressing on your bladder.  You may develop or continue to have heartburn as a result of your pregnancy.  You may develop constipation because certain hormones are causing the muscles that push waste through your intestines to slow down.  You may develop hemorrhoids or swollen, bulging veins (varicose veins).  You may have pelvic pain because of the weight gain and pregnancy hormones relaxing your joints between the bones in your pelvis. Backaches may result from overexertion of the muscles supporting your posture.  You may have changes in your hair. These can include thickening of your hair, rapid growth, and changes in texture. Some women also have hair loss during or after pregnancy, or hair that feels dry or thin. Your hair will most likely return to normal after your baby is born.  Your breasts will continue to grow and be tender. A yellow discharge may leak from your breasts called colostrum.  Your belly button may stick out.  You may feel short of breath because of your expanding uterus.  You may notice the fetus "dropping," or moving lower in your abdomen.  You may have a bloody mucus discharge. This usually occurs a few days to a week before labor begins.  Your cervix becomes thin and soft (effaced) near your due date. WHAT TO EXPECT AT YOUR  PRENATAL EXAMS  You will have prenatal exams every 2 weeks until week 36. Then, you will have weekly prenatal exams. During a routine prenatal visit:  You will be weighed to make sure you and the fetus are growing normally.  Your blood pressure is taken.  Your abdomen will be measured to track your baby's growth.  The fetal heartbeat will be listened to.  Any test results from the previous visit will be discussed.  You may have a cervical check near your due date to see if you have effaced. At around 36 weeks, your caregiver will check your cervix. At the same time, your caregiver will also perform a test on the secretions of the vaginal tissue. This test is to determine if a type of bacteria, Group B streptococcus, is present. Your caregiver will explain this further. Your caregiver may ask you:  What your birth plan is.  How you are feeling.  If you are feeling the baby move.  If you have had any abnormal symptoms, such as leaking fluid, bleeding, severe headaches, or abdominal cramping.  If you are using any tobacco products, including cigarettes, chewing tobacco, and electronic cigarettes.  If you have any questions. Other tests or screenings that may be performed during your third trimester include:  Blood tests that check for low iron levels (anemia).  Fetal testing to check the health, activity level, and growth of the fetus. Testing is done if you have certain medical conditions or if   there are problems during the pregnancy.  HIV (human immunodeficiency virus) testing. If you are at high risk, you may be screened for HIV during your third trimester of pregnancy. FALSE LABOR You may feel small, irregular contractions that eventually go away. These are called Braxton Hicks contractions, or false labor. Contractions may last for hours, days, or even weeks before true labor sets in. If contractions come at regular intervals, intensify, or become painful, it is best to be seen  by your caregiver.  SIGNS OF LABOR   Menstrual-like cramps.  Contractions that are 5 minutes apart or less.  Contractions that start on the top of the uterus and spread down to the lower abdomen and back.  A sense of increased pelvic pressure or back pain.  A watery or bloody mucus discharge that comes from the vagina. If you have any of these signs before the 37th week of pregnancy, call your caregiver right away. You need to go to the hospital to get checked immediately. HOME CARE INSTRUCTIONS   Avoid all smoking, herbs, alcohol, and unprescribed drugs. These chemicals affect the formation and growth of the baby.  Do not use any tobacco products, including cigarettes, chewing tobacco, and electronic cigarettes. If you need help quitting, ask your health care provider. You may receive counseling support and other resources to help you quit.  Follow your caregiver's instructions regarding medicine use. There are medicines that are either safe or unsafe to take during pregnancy.  Exercise only as directed by your caregiver. Experiencing uterine cramps is a good sign to stop exercising.  Continue to eat regular, healthy meals.  Wear a good support bra for breast tenderness.  Do not use hot tubs, steam rooms, or saunas.  Wear your seat belt at all times when driving.  Avoid raw meat, uncooked cheese, cat litter boxes, and soil used by cats. These carry germs that can cause birth defects in the baby.  Take your prenatal vitamins.  Take 1500-2000 mg of calcium daily starting at the 20th week of pregnancy until you deliver your baby.  Try taking a stool softener (if your caregiver approves) if you develop constipation. Eat more high-fiber foods, such as fresh vegetables or fruit and whole grains. Drink plenty of fluids to keep your urine clear or pale yellow.  Take warm sitz baths to soothe any pain or discomfort caused by hemorrhoids. Use hemorrhoid cream if your caregiver  approves.  If you develop varicose veins, wear support hose. Elevate your feet for 15 minutes, 3-4 times a day. Limit salt in your diet.  Avoid heavy lifting, wear low heal shoes, and practice good posture.  Rest a lot with your legs elevated if you have leg cramps or low back pain.  Visit your dentist if you have not gone during your pregnancy. Use a soft toothbrush to brush your teeth and be gentle when you floss.  A sexual relationship may be continued unless your caregiver directs you otherwise.  Do not travel far distances unless it is absolutely necessary and only with the approval of your caregiver.  Take prenatal classes to understand, practice, and ask questions about the labor and delivery.  Make a trial run to the hospital.  Pack your hospital bag.  Prepare the baby's nursery.  Continue to go to all your prenatal visits as directed by your caregiver. SEEK MEDICAL CARE IF:  You are unsure if you are in labor or if your water has broken.  You have dizziness.  You have   mild pelvic cramps, pelvic pressure, or nagging pain in your abdominal area.  You have persistent nausea, vomiting, or diarrhea.  You have a bad smelling vaginal discharge.  You have pain with urination. SEEK IMMEDIATE MEDICAL CARE IF:   You have a fever.  You are leaking fluid from your vagina.  You have spotting or bleeding from your vagina.  You have severe abdominal cramping or pain.  You have rapid weight loss or gain.  You have shortness of breath with chest pain.  You notice sudden or extreme swelling of your face, hands, ankles, feet, or legs.  You have not felt your baby move in over an hour.  You have severe headaches that do not go away with medicine.  You have vision changes.   This information is not intended to replace advice given to you by your health care provider. Make sure you discuss any questions you have with your health care provider.   Document Released:  01/24/2001 Document Revised: 02/20/2014 Document Reviewed: 04/02/2012 Elsevier Interactive Patient Education 2016 [employer]  Breastfeeding Deciding to breastfeed is one of the best choices you can make for you and your baby. A change in hormones during pregnancy causes your breast tissue to grow and increases the number and size of your milk ducts. These hormones also allow proteins, sugars, and fats from your blood supply to make breast milk in your milk-producing glands. Hormones prevent breast milk from being released before your baby is born as well as prompt milk flow after birth. Once breastfeeding has begun, thoughts of your baby, as well as his or her sucking or crying, can stimulate the release of milk from your milk-producing glands.  BENEFITS OF BREASTFEEDING For Your Baby  Your first milk (colostrum) helps your baby's digestive system function better.  There are antibodies in your milk that help your baby fight off infections.  Your baby has a lower incidence of asthma, allergies, and sudden infant death syndrome.  The nutrients in breast milk are better for your baby than infant formulas and are designed uniquely for your baby's needs.  Breast milk improves your baby's brain development.  Your baby is less likely to develop other conditions, such as childhood obesity, asthma, or type 2 diabetes mellitus. For You  Breastfeeding helps to create a very special bond between you and your baby.  Breastfeeding is convenient. Breast milk is always available at the correct temperature and costs nothing.  Breastfeeding helps to burn calories and helps you lose the weight gained during pregnancy.  Breastfeeding makes your uterus contract to its prepregnancy size faster and slows bleeding (lochia) after you give birth.   Breastfeeding helps to lower your risk of developing type 2 diabetes mellitus, osteoporosis, and breast or ovarian cancer later in life. SIGNS THAT YOUR BABY IS  HUNGRY Early Signs of Hunger  Increased alertness or activity.  Stretching.  Movement of the head from side to side.  Movement of the head and opening of the mouth when the corner of the mouth or cheek is stroked (rooting).  Increased sucking sounds, smacking lips, cooing, sighing, or squeaking.  Hand-to-mouth movements.  Increased sucking of fingers or hands. Late Signs of Hunger  Fussing.  Intermittent crying. Extreme Signs of Hunger Signs of extreme hunger will require calming and consoling before your baby will be able to breastfeed successfully. Do not wait for the following signs of extreme hunger to occur before you initiate breastfeeding:  Restlessness.  A loud, strong cry.  Screaming.   BREASTFEEDING BASICS Breastfeeding Initiation  Find a comfortable place to sit or lie down, with your neck and back well supported.  Place a pillow or rolled up blanket under your baby to bring him or her to the level of your breast (if you are seated). Nursing pillows are specially designed to help support your arms and your baby while you breastfeed.  Make sure that your baby's abdomen is facing your abdomen.  Gently massage your breast. With your fingertips, massage from your chest wall toward your nipple in a circular motion. This encourages milk flow. You may need to continue this action during the feeding if your milk flows slowly.  Support your breast with 4 fingers underneath and your thumb above your nipple. Make sure your fingers are well away from your nipple and your baby's mouth.  Stroke your baby's lips gently with your finger or nipple.  When your baby's mouth is open wide enough, quickly bring your baby to your breast, placing your entire nipple and as much of the colored area around your nipple (areola) as possible into your baby's mouth.  More areola should be visible above your baby's upper lip than below the lower lip.  Your baby's tongue should be between his  or her lower gum and your breast.  Ensure that your baby's mouth is correctly positioned around your nipple (latched). Your baby's lips should create a seal on your breast and be turned out (everted).  It is common for your baby to suck about 2-3 minutes in order to start the flow of breast milk. Latching Teaching your baby how to latch on to your breast properly is very important. An improper latch can cause nipple pain and decreased milk supply for you and poor weight gain in your baby. Also, if your baby is not latched onto your nipple properly, he or she may swallow some air during feeding. This can make your baby fussy. Burping your baby when you switch breasts during the feeding can help to get rid of the air. However, teaching your baby to latch on properly is still the best way to prevent fussiness from swallowing air while breastfeeding. Signs that your baby has successfully latched on to your nipple:  Silent tugging or silent sucking, without causing you pain.  Swallowing heard between every 3-4 sucks.  Muscle movement above and in front of his or her ears while sucking. Signs that your baby has not successfully latched on to nipple:  Sucking sounds or smacking sounds from your baby while breastfeeding.  Nipple pain. If you think your baby has not latched on correctly, slip your finger into the corner of your baby's mouth to break the suction and place it between your baby's gums. Attempt breastfeeding initiation again. Signs of Successful Breastfeeding Signs from your baby:  A gradual decrease in the number of sucks or complete cessation of sucking.  Falling asleep.  Relaxation of his or her body.  Retention of a small amount of milk in his or her mouth.  Letting go of your breast by himself or herself. Signs from you:  Breasts that have increased in firmness, weight, and size 1-3 hours after feeding.  Breasts that are softer immediately after  breastfeeding.  Increased milk volume, as well as a change in milk consistency and color by the fifth day of breastfeeding.  Nipples that are not sore, cracked, or bleeding. Signs That Your Baby is Getting Enough Milk  Wetting at least 3 diapers in a 24-hour period.   The urine should be clear and pale yellow by age 5 days.  At least 3 stools in a 24-hour period by age 5 days. The stool should be soft and yellow.  At least 3 stools in a 24-hour period by age 7 days. The stool should be seedy and yellow.  No loss of weight greater than 10% of birth weight during the first 3 days of age.  Average weight gain of 4-7 ounces (113-198 g) per week after age 4 days.  Consistent daily weight gain by age 5 days, without weight loss after the age of 2 weeks. After a feeding, your baby may spit up a small amount. This is common. BREASTFEEDING FREQUENCY AND DURATION Frequent feeding will help you make more milk and can prevent sore nipples and breast engorgement. Breastfeed when you feel the need to reduce the fullness of your breasts or when your baby shows signs of hunger. This is called "breastfeeding on demand." Avoid introducing a pacifier to your baby while you are working to establish breastfeeding (the first 4-6 weeks after your baby is born). After this time you may choose to use a pacifier. Research has shown that pacifier use during the first year of a baby's life decreases the risk of sudden infant death syndrome (SIDS). Allow your baby to feed on each breast as long as he or she wants. Breastfeed until your baby is finished feeding. When your baby unlatches or falls asleep while feeding from the first breast, offer the second breast. Because newborns are often sleepy in the first few weeks of life, you may need to awaken your baby to get him or her to feed. Breastfeeding times will vary from baby to baby. However, the following rules can serve as a guide to help you ensure that your baby is  properly fed:  Newborns (babies 4 weeks of age or younger) may breastfeed every 1-3 hours.  Newborns should not go longer than 3 hours during the day or 5 hours during the night without breastfeeding.  You should breastfeed your baby a minimum of 8 times in a 24-hour period until you begin to introduce solid foods to your baby at around 6 months of age. BREAST MILK PUMPING Pumping and storing breast milk allows you to ensure that your baby is exclusively fed your breast milk, even at times when you are unable to breastfeed. This is especially important if you are going back to work while you are still breastfeeding or when you are not able to be present during feedings. Your lactation consultant can give you guidelines on how long it is safe to store breast milk. A breast pump is a machine that allows you to pump milk from your breast into a sterile bottle. The pumped breast milk can then be stored in a refrigerator or freezer. Some breast pumps are operated by hand, while others use electricity. Ask your lactation consultant which type will work best for you. Breast pumps can be purchased, but some hospitals and breastfeeding support groups lease breast pumps on a monthly basis. A lactation consultant can teach you how to hand express breast milk, if you prefer not to use a pump. CARING FOR YOUR BREASTS WHILE YOU BREASTFEED Nipples can become dry, cracked, and sore while breastfeeding. The following recommendations can help keep your breasts moisturized and healthy:  Avoid using soap on your nipples.  Wear a supportive bra. Although not required, special nursing bras and tank tops are designed to allow access to your   breasts for breastfeeding without taking off your entire bra or top. Avoid wearing underwire-style bras or extremely tight bras.  Air dry your nipples for 3-4minutes after each feeding.  Use only cotton bra pads to absorb leaked breast milk. Leaking of breast milk between feedings  is normal.  Use lanolin on your nipples after breastfeeding. Lanolin helps to maintain your skin's normal moisture barrier. If you use pure lanolin, you do not need to wash it off before feeding your baby again. Pure lanolin is not toxic to your baby. You may also hand express a few drops of breast milk and gently massage that milk into your nipples and allow the milk to air dry. In the first few weeks after giving birth, some women experience extremely full breasts (engorgement). Engorgement can make your breasts feel heavy, warm, and tender to the touch. Engorgement peaks within 3-5 days after you give birth. The following recommendations can help ease engorgement:  Completely empty your breasts while breastfeeding or pumping. You may want to start by applying warm, moist heat (in the shower or with warm water-soaked hand towels) just before feeding or pumping. This increases circulation and helps the milk flow. If your baby does not completely empty your breasts while breastfeeding, pump any extra milk after he or she is finished.  Wear a snug bra (nursing or regular) or tank top for 1-2 days to signal your body to slightly decrease milk production.  Apply ice packs to your breasts, unless this is too uncomfortable for you.  Make sure that your baby is latched on and positioned properly while breastfeeding. If engorgement persists after 48 hours of following these recommendations, contact your health care provider or a lactation consultant. OVERALL HEALTH CARE RECOMMENDATIONS WHILE BREASTFEEDING  Eat healthy foods. Alternate between meals and snacks, eating 3 of each per day. Because what you eat affects your breast milk, some of the foods may make your baby more irritable than usual. Avoid eating these foods if you are sure that they are negatively affecting your baby.  Drink milk, fruit juice, and water to satisfy your thirst (about 10 glasses a day).  Rest often, relax, and continue to take  your prenatal vitamins to prevent fatigue, stress, and anemia.  Continue breast self-awareness checks.  Avoid chewing and smoking tobacco. Chemicals from cigarettes that pass into breast milk and exposure to secondhand smoke may harm your baby.  Avoid alcohol and drug use, including marijuana. Some medicines that may be harmful to your baby can pass through breast milk. It is important to ask your health care provider before taking any medicine, including all over-the-counter and prescription medicine as well as vitamin and herbal supplements. It is possible to become pregnant while breastfeeding. If birth control is desired, ask your health care provider about options that will be safe for your baby. SEEK MEDICAL CARE IF:  You feel like you want to stop breastfeeding or have become frustrated with breastfeeding.  You have painful breasts or nipples.  Your nipples are cracked or bleeding.  Your breasts are red, tender, or warm.  You have a swollen area on either breast.  You have a fever or chills.  You have nausea or vomiting.  You have drainage other than breast milk from your nipples.  Your breasts do not become full before feedings by the fifth day after you give birth.  You feel sad and depressed.  Your baby is too sleepy to eat well.  Your baby is having trouble sleeping.     Your baby is wetting less than 3 diapers in a 24-hour period.  Your baby has less than 3 stools in a 24-hour period.  Your baby's skin or the white part of his or her eyes becomes yellow.   Your baby is not gaining weight by 5 days of age. SEEK IMMEDIATE MEDICAL CARE IF:  Your baby is overly tired (lethargic) and does not want to wake up and feed.  Your baby develops an unexplained fever.   This information is not intended to replace advice given to you by your health care provider. Make sure you discuss any questions you have with your health care provider.   Document Released: 01/30/2005  Document Revised: 10/21/2014 Document Reviewed: 07/24/2012 Elsevier Interactive Patient Education 2016 [employer]  

## 2015-04-27 NOTE — Progress Notes (Signed)
Subjective:  Bonnie Mcneil is a 35 y.o. [phone] at 6645w3d being seen today for ongoing prenatal care.  She is currently monitored for the following issues for this low-risk pregnancy and has Supervision of normal pregnancy and Obesity complicating pregnancy on her problem list.  Patient reports no complaints.  Contractions: Irregular. Vag. Bleeding: None.  Movement: Present. Denies leaking of fluid.   The following portions of the patient's history were reviewed and updated as appropriate: allergies, current medications, past family history, past medical history, past social history, past surgical history and problem list. Problem list updated.  Objective:   Filed Vitals:   04/27/15 0922  BP: 133/81  Pulse: 97  Weight: 247 lb (112.038 kg)    Fetal Status: Fetal Heart Rate (bpm): 128 Fundal Height: 36 cm Movement: Present     General:  Alert, oriented and cooperative. Patient is in no acute distress.  Skin: Skin is warm and dry. No rash noted.   Cardiovascular: Normal heart rate noted  Respiratory: Normal respiratory effort, no problems with respiration noted  Abdomen: Soft, gravid, appropriate for gestational age. Pain/Pressure: Present     Pelvic: Vag. Bleeding: None Vag D/C Character: Thin   Cervical exam deferred        Extremities: Normal range of motion.  Edema: None  Mental Status: Normal mood and affect. Normal behavior. Normal judgment and thought content.   Urinalysis: Urine Protein: Negative Urine Glucose: Negative  Assessment and Plan:  Pregnancy: [id number] at 5145w3d  1. Supervision of normal pregnancy, third trimester Continue routine prenatal care.   Preterm labor symptoms and general obstetric precautions including but not limited to vaginal bleeding, contractions, leaking of fluid and fetal movement were reviewed in detail with the patient. Please refer to After Visit Summary for other counseling recommendations.  Return in 2 weeks (on 05/11/2015).   Reva Boresanya S  Saralynn Langhorst, MD

## 2015-05-11 ENCOUNTER — Ambulatory Visit (INDEPENDENT_AMBULATORY_CARE_PROVIDER_SITE_OTHER): Payer: Self-pay | Admitting: Obstetrics & Gynecology

## 2015-05-11 VITALS — BP 126/82 | HR 85 | Wt 252.0 lb

## 2015-05-11 DIAGNOSIS — Z3483 Encounter for supervision of other normal pregnancy, third trimester: Secondary | ICD-10-CM

## 2015-05-11 DIAGNOSIS — Z3493 Encounter for supervision of normal pregnancy, unspecified, third trimester: Secondary | ICD-10-CM

## 2015-05-11 DIAGNOSIS — Z36 Encounter for antenatal screening of mother: Secondary | ICD-10-CM

## 2015-05-11 DIAGNOSIS — Z113 Encounter for screening for infections with a predominantly sexual mode of transmission: Secondary | ICD-10-CM

## 2015-05-11 LAB — OB RESULTS CONSOLE GBS: GBS: POSITIVE

## 2015-05-11 LAB — OB RESULTS CONSOLE GC/CHLAMYDIA: Gonorrhea: NEGATIVE

## 2015-05-11 NOTE — Progress Notes (Signed)
Subjective:  Bonnie Mcneil is a 35 y.o. [phone] at 4958w3d being seen today for ongoing prenatal care.  She is currently monitored for the following issues for this low-risk pregnancy and has Supervision of normal pregnancy and Obesity complicating pregnancy on her problem list.  Patient reports no complaints.  Contractions: Irregular. Vag. Bleeding: None.  Movement: Present. Denies leaking of fluid.   The following portions of the patient's history were reviewed and updated as appropriate: allergies, current medications, past family history, past medical history, past social history, past surgical history and problem list. Problem list updated.  Objective:   Filed Vitals:   05/11/15 0848  BP: 126/82  Pulse: 85  Weight: 252 lb (114.306 kg)    Fetal Status: Fetal Heart Rate (bpm): 133 Fundal Height: 38 cm Movement: Present  Presentation: Vertex (on clinic u/s)  General:  Alert, oriented and cooperative. Patient is in no acute distress.  Skin: Skin is warm and dry. No rash noted.   Cardiovascular: Normal heart rate noted  Respiratory: Normal respiratory effort, no problems with respiration noted  Abdomen: Soft, gravid, appropriate for gestational age. Pain/Pressure: Present     Pelvic: Vag. Bleeding: None Vag D/C Character: Thin   Cervical exam performed Dilation: Closed Effacement (%): Thick Station: Ballotable  Extremities: Normal range of motion.  Edema: Trace  Mental Status: Normal mood and affect. Normal behavior. Normal judgment and thought content.   Urinalysis: Urine Protein: Negative Urine Glucose: Negative  Assessment and Plan:  Pregnancy: [id number] at 7558w3d  1. Supervision of normal pregnancy, third trimester - Culture, beta strep (group b only) - GC/Chlamydia probe amp ([facility])not at [facility] Preterm labor symptoms and general obstetric precautions including but not limited to vaginal bleeding, contractions, leaking of fluid and fetal movement were reviewed in detail  with the patient. Please refer to After Visit Summary for other counseling recommendations.  Return in about 1 week (around 05/18/2015) for OB Visit.   Tereso NewcomerUgonna A Henlee Donovan, MD

## 2015-05-11 NOTE — Patient Instructions (Signed)
Return to clinic for any scheduled appointments or obstetric concerns, or go to MAU for evaluation  

## 2015-05-12 LAB — GC/CHLAMYDIA PROBE AMP (~~LOC~~) NOT AT ARMC
CHLAMYDIA, DNA PROBE: NEGATIVE
Neisseria Gonorrhea: NEGATIVE

## 2015-05-13 LAB — CULTURE, BETA STREP (GROUP B ONLY)

## 2015-05-17 ENCOUNTER — Encounter: Payer: Self-pay | Admitting: Obstetrics & Gynecology

## 2015-05-17 DIAGNOSIS — O99820 Streptococcus B carrier state complicating pregnancy: Secondary | ICD-10-CM | POA: Insufficient documentation

## 2015-05-19 ENCOUNTER — Inpatient Hospital Stay (HOSPITAL_COMMUNITY)
Admission: AD | Admit: 2015-05-19 | Discharge: 2015-05-22 | DRG: 775 | Disposition: A | Discharge status: Home / Self Care | Payer: Medicaid Other | Source: Ambulatory Visit | Attending: Obstetrics & Gynecology | Admitting: Obstetrics & Gynecology

## 2015-05-19 ENCOUNTER — Ambulatory Visit (INDEPENDENT_AMBULATORY_CARE_PROVIDER_SITE_OTHER): Payer: Medicaid Other | Admitting: Obstetrics and Gynecology

## 2015-05-19 ENCOUNTER — Encounter (HOSPITAL_COMMUNITY): Payer: Self-pay

## 2015-05-19 VITALS — BP 118/75 | HR 78 | Wt 252.0 lb

## 2015-05-19 DIAGNOSIS — Z6841 Body Mass Index (BMI) 40.0 and over, adult: Secondary | ICD-10-CM | POA: Diagnosis not present

## 2015-05-19 DIAGNOSIS — Z9049 Acquired absence of other specified parts of digestive tract: Secondary | ICD-10-CM

## 2015-05-19 DIAGNOSIS — O99213 Obesity complicating pregnancy, third trimester: Secondary | ICD-10-CM | POA: Diagnosis not present

## 2015-05-19 DIAGNOSIS — O4202 Full-term premature rupture of membranes, onset of labor within 24 hours of rupture: Secondary | ICD-10-CM | POA: Diagnosis present

## 2015-05-19 DIAGNOSIS — Z22330 Carrier of Group B streptococcus: Secondary | ICD-10-CM

## 2015-05-19 DIAGNOSIS — O99824 Streptococcus B carrier state complicating childbirth: Secondary | ICD-10-CM | POA: Diagnosis present

## 2015-05-19 DIAGNOSIS — Z3483 Encounter for supervision of other normal pregnancy, third trimester: Secondary | ICD-10-CM | POA: Diagnosis not present

## 2015-05-19 DIAGNOSIS — O99214 Obesity complicating childbirth: Secondary | ICD-10-CM | POA: Diagnosis present

## 2015-05-19 DIAGNOSIS — E669 Obesity, unspecified: Secondary | ICD-10-CM

## 2015-05-19 DIAGNOSIS — Z3A37 37 weeks gestation of pregnancy: Secondary | ICD-10-CM

## 2015-05-19 DIAGNOSIS — O99820 Streptococcus B carrier state complicating pregnancy: Secondary | ICD-10-CM

## 2015-05-19 DIAGNOSIS — Z3493 Encounter for supervision of normal pregnancy, unspecified, third trimester: Secondary | ICD-10-CM

## 2015-05-19 DIAGNOSIS — O4290 Premature rupture of membranes, unspecified as to length of time between rupture and onset of labor, unspecified weeks of gestation: Secondary | ICD-10-CM | POA: Diagnosis present

## 2015-05-19 LAB — CBC
HCT: 35.6 % — ABNORMAL LOW (ref 36.0–46.0)
HEMOGLOBIN: 12.3 g/dL (ref 12.0–15.0)
MCH: 29.6 pg (ref 26.0–34.0)
MCHC: 34.6 g/dL (ref 30.0–36.0)
MCV: 85.8 fL (ref 78.0–100.0)
Platelets: 223 10*3/uL (ref 150–400)
RBC: 4.15 MIL/uL (ref 3.87–5.11)
RDW: 14 % (ref 11.5–15.5)
WBC: 11.9 10*3/uL — ABNORMAL HIGH (ref 4.0–10.5)

## 2015-05-19 LAB — TYPE AND SCREEN
ABO/RH(D): O POS
Antibody Screen: NEGATIVE

## 2015-05-19 LAB — POCT FERN TEST: POCT FERN TEST: POSITIVE

## 2015-05-19 MED ORDER — LACTATED RINGERS IV SOLN
2.5000 [IU]/h | INTRAVENOUS | Status: DC
  Filled 2015-05-19: qty 4

## 2015-05-19 MED ORDER — TERBUTALINE SULFATE 1 MG/ML IJ SOLN
0.2500 mg | Freq: Once | INTRAMUSCULAR | Status: DC | PRN
  Filled 2015-05-19: qty 1

## 2015-05-19 MED ORDER — DEXTROSE 5 % IV SOLN
2.5000 10*6.[IU] | INTRAVENOUS | Status: DC
  Administered 2015-05-20 (×6): 2.5 10*6.[IU] via INTRAVENOUS
  Filled 2015-05-19 (×14): qty 2.5

## 2015-05-19 MED ORDER — LIDOCAINE HCL (PF) 1 % IJ SOLN
30.0000 mL | INTRAMUSCULAR | Status: DC | PRN
  Filled 2015-05-19: qty 30

## 2015-05-19 MED ORDER — CITRIC ACID-SODIUM CITRATE 334-500 MG/5ML PO SOLN: 30.0000 mL | ORAL | Status: DC | PRN

## 2015-05-19 MED ORDER — ONDANSETRON HCL 4 MG/2ML IJ SOLN: 4.0000 mg | Freq: Four times a day (QID) | INTRAMUSCULAR | Status: DC | PRN

## 2015-05-19 MED ORDER — MISOPROSTOL 200 MCG PO TABS
50.0000 ug | ORAL_TABLET | ORAL | Status: DC
  Administered 2015-05-19 – 2015-05-20 (×4): 50 ug via ORAL
  Filled 2015-05-19 (×4): qty 0.5

## 2015-05-19 MED ORDER — OXYTOCIN BOLUS FROM INFUSION
500.0000 mL | INTRAVENOUS | Status: DC
  Administered 2015-05-20: 500 mL via INTRAVENOUS

## 2015-05-19 MED ORDER — LACTATED RINGERS IV SOLN
INTRAVENOUS | Status: DC
  Administered 2015-05-19 – 2015-05-20 (×3): via INTRAVENOUS

## 2015-05-19 MED ORDER — OXYCODONE-ACETAMINOPHEN 5-325 MG PO TABS: 2.0000 | ORAL_TABLET | ORAL | Status: DC | PRN

## 2015-05-19 MED ORDER — LACTATED RINGERS IV SOLN
500.0000 mL | INTRAVENOUS | Status: DC | PRN
  Administered 2015-05-20: 500 mL via INTRAVENOUS

## 2015-05-19 MED ORDER — PENICILLIN G POTASSIUM 5000000 UNITS IJ SOLR
5.0000 10*6.[IU] | Freq: Once | INTRAVENOUS | Status: AC
  Administered 2015-05-19: 5 10*6.[IU] via INTRAVENOUS
  Filled 2015-05-19: qty 5

## 2015-05-19 MED ORDER — OXYCODONE-ACETAMINOPHEN 5-325 MG PO TABS: 1.0000 | ORAL_TABLET | ORAL | Status: DC | PRN

## 2015-05-19 MED ORDER — ACETAMINOPHEN 325 MG PO TABS: 650.0000 mg | ORAL_TABLET | ORAL | Status: DC | PRN

## 2015-05-19 MED ORDER — FENTANYL CITRATE (PF) 100 MCG/2ML IJ SOLN: 100.0000 ug | INTRAMUSCULAR | Status: DC | PRN

## 2015-05-19 MED ORDER — ZOLPIDEM TARTRATE 5 MG PO TABS
5.0000 mg | ORAL_TABLET | Freq: Every evening | ORAL | Status: DC | PRN
  Filled 2015-05-19: qty 1

## 2015-05-19 NOTE — Progress Notes (Signed)
Subjective:  Bonnie Mcneil is a 35 y.o. [phone] at 6722w4d being seen today for ongoing prenatal care.  She is currently monitored for the following issues for this low-risk pregnancy and has Supervision of normal pregnancy; Obesity complicating pregnancy; and Group B Streptococcus carrier, +RV culture, currently pregnant on her problem list.  Patient reports no complaints.  Contractions: Irregular. Vag. Bleeding: None.  Movement: Present. Denies leaking of fluid.   The following portions of the patient's history were reviewed and updated as appropriate: allergies, current medications, past family history, past medical history, past social history, past surgical history and problem list. Problem list updated.  Objective:   Filed Vitals:   05/19/15 0833  BP: 118/75  Pulse: 78  Weight: 252 lb (114.306 kg)    Fetal Status:   Fundal Height: 39 cm Movement: Present     General:  Alert, oriented and cooperative. Patient is in no acute distress.  Skin: Skin is warm and dry. No rash noted.   Cardiovascular: Normal heart rate noted  Respiratory: Normal respiratory effort, no problems with respiration noted  Abdomen: Soft, gravid, appropriate for gestational age. Pain/Pressure: Present     Pelvic: Vag. Bleeding: None Vag D/C Character: Thin   Cervical exam deferred        Extremities: Normal range of motion.  Edema: Trace  Mental Status: Normal mood and affect. Normal behavior. Normal judgment and thought content.   Urinalysis:      Assessment and Plan:  Pregnancy: [id number] at 6522w4d  1. Group B Streptococcus carrier, +RV culture, currently pregnant Results reviewed with the patient  2. Obesity complicating pregnancy, third trimester   3. Supervision of normal pregnancy, third trimester Patient is doing well without complaints  Term labor symptoms and general obstetric precautions including but not limited to vaginal bleeding, contractions, leaking of fluid and fetal movement were  reviewed in detail with the patient. Please refer to After Visit Summary for other counseling recommendations.  Return in about 1 week (around 05/26/2015).   Catalina AntiguaPeggy Taiwana Willison, MD

## 2015-05-19 NOTE — H&P (Signed)
Eyvonne LeftCynthia L Radliff is a 35 y.o. female [phone] @ 37.4wks by 20wk scan presenting for leaking fluid since 1900. Not having painful ctx; +FM. Denies N/V, visual disturbances or H/A. [facility] w/ onset of care @ 26wks and has been remarkable for 1) obesity 2) GBS +  History OB History    Gravida Para Term Preterm AB TAB SAB Ectopic Multiple Living   5 3 3  0 1 1 0 0 0 3     Past Medical History  Diagnosis Date  . Cholecystitis 2008  . Intrauterine pregnancy 2004, 2008  . Therapeutic abortion in first trimester 2008    secondary to concern for baby during cholecystecomy  . Chlamydia 2004   Past Surgical History  Procedure Laterality Date  . Cholecystectomy  2008   Family History: family history includes Hypotension in her mother. There is no history of Anesthesia problems, Malignant hyperthermia, or Pseudochol deficiency. Social History:  reports that she has never smoked. She has never used smokeless tobacco. She reports that she does not drink alcohol or use illicit drugs.   Prenatal Transfer Tool  Maternal Diabetes: No Genetic Screening: Declined Maternal Ultrasounds/Referrals: Normal Fetal Ultrasounds or other Referrals:  None Maternal Substance Abuse:  No Significant Maternal Medications:  None Significant Maternal Lab Results:  Lab values include: Group B Strep positive Other Comments:  None- onset of care @ 26wks  ROS  Dilation: Fingertip Effacement (%): 50 Station: -3 Exam by:: Sharen Hintaroline Brewer RNC Last menstrual period 12/26/2014. Exam Physical Exam  Constitutional: She is oriented to person, place, and time. She appears well-developed.  HENT:  Head: Normocephalic.  Neck: Normal range of motion.  Cardiovascular: Normal rate.   Respiratory: Effort normal.  GI:  EFM 130s, +accels, no decels Occ uterine irritability  Musculoskeletal: Normal range of motion.  Neurological: She is alert and oriented to person,  place, and time.  Skin: Skin is warm and dry.  Psychiatric: She has a normal mood and affect. Her behavior is normal. Thought content normal.    Prenatal labs: ABO, Rh: O/POS/-- (01/17 1409) Antibody: NEG (01/17 1409) Rubella: 12.50 (01/17 1409) RPR: NON REAC (01/17 1409)  HBsAg: NEGATIVE (01/17 1409)  HIV: NONREACTIVE (01/17 1409)  GBS:   POS  Assessment/Plan: IUP@37 .4wks PROM GBS pos Cx unfavorable  [facility] Give PCN for GBS ppx Begin cytotec PO for cx ripening   Lamario Mani CNM 05/19/2015, 9:08 PM

## 2015-05-19 NOTE — MAU Note (Signed)
Pt presents complaining of leaking of fluid with pink discharge. Denies pain. Last cervical exam was closed.

## 2015-05-19 NOTE — Progress Notes (Signed)
Notified of pt arrival in MAU, SROM and vaginal exam. Will put in orders for admission

## 2015-05-20 ENCOUNTER — Inpatient Hospital Stay (HOSPITAL_COMMUNITY): Payer: Medicaid Other | Admitting: Anesthesiology

## 2015-05-20 DIAGNOSIS — O4202 Full-term premature rupture of membranes, onset of labor within 24 hours of rupture: Secondary | ICD-10-CM

## 2015-05-20 DIAGNOSIS — O99824 Streptococcus B carrier state complicating childbirth: Secondary | ICD-10-CM

## 2015-05-20 DIAGNOSIS — Z3A37 37 weeks gestation of pregnancy: Secondary | ICD-10-CM

## 2015-05-20 MED ORDER — FENTANYL 2.5 MCG/ML BUPIVACAINE 1/10 % EPIDURAL INFUSION (WH - ANES)
INTRAMUSCULAR | Status: AC
Start: 2015-05-20 — End: 2015-05-20
  Administered 2015-05-20: 22:00:00
  Filled 2015-05-20: qty 125

## 2015-05-20 MED ORDER — LIDOCAINE HCL (PF) 1 % IJ SOLN
INTRAMUSCULAR | Status: DC | PRN
  Administered 2015-05-20: 5 mL via EPIDURAL
  Administered 2015-05-20: 2 mL via EPIDURAL
  Administered 2015-05-20: 3 mL via EPIDURAL

## 2015-05-20 MED ORDER — PHENYLEPHRINE 40 MCG/ML (10ML) SYRINGE FOR IV PUSH (FOR BLOOD PRESSURE SUPPORT)
80.0000 ug | PREFILLED_SYRINGE | INTRAVENOUS | Status: DC | PRN
  Filled 2015-05-20: qty 2

## 2015-05-20 MED ORDER — OXYTOCIN 10 UNIT/ML IJ SOLN
1.0000 m[IU]/min | INTRAVENOUS | Status: DC
  Administered 2015-05-20: 2 m[IU]/min via INTRAVENOUS

## 2015-05-20 MED ORDER — LACTATED RINGERS IV SOLN: 500.0000 mL | Freq: Once | INTRAVENOUS | Status: AC

## 2015-05-20 MED ORDER — EPHEDRINE 5 MG/ML INJ
INTRAVENOUS | Status: AC
  Filled 2015-05-20: qty 4

## 2015-05-20 MED ORDER — LACTATED RINGERS IV SOLN: 500.0000 mL | Freq: Once | INTRAVENOUS | Status: DC

## 2015-05-20 MED ORDER — FENTANYL 2.5 MCG/ML BUPIVACAINE 1/10 % EPIDURAL INFUSION (WH - ANES)
14.0000 mL/h | INTRAMUSCULAR | Status: DC | PRN
  Administered 2015-05-20: 14 mL/h via EPIDURAL

## 2015-05-20 MED ORDER — EPHEDRINE 5 MG/ML INJ
10.0000 mg | INTRAVENOUS | Status: DC | PRN
  Filled 2015-05-20: qty 2

## 2015-05-20 MED ORDER — LACTATED RINGERS IV SOLN
500.0000 mL | Freq: Once | INTRAVENOUS | Status: AC
  Administered 2015-05-20: 500 mL via INTRAVENOUS

## 2015-05-20 MED ORDER — DIPHENHYDRAMINE HCL 50 MG/ML IJ SOLN: 12.5000 mg | INTRAMUSCULAR | Status: DC | PRN

## 2015-05-20 MED ORDER — PHENYLEPHRINE 40 MCG/ML (10ML) SYRINGE FOR IV PUSH (FOR BLOOD PRESSURE SUPPORT)
PREFILLED_SYRINGE | INTRAVENOUS | Status: AC
  Filled 2015-05-20: qty 20

## 2015-05-20 NOTE — Progress Notes (Signed)
Labor Progress Note Eyvonne LeftCynthia L Guertin is a 35 y.o. [id number] at 5123w5d presented for SROM. S:  Pt reports feeling strong contractions. Pt is still able to ambulate. Interested in epidural if the pain becomes unbearable. Pt is currently tearful about not having her mother around.  O:  BP 125/71 mmHg  Pulse 83  Temp(Src) 98.2 F (36.8 C) (Oral)  Resp 20  Ht 5\' 6"  (1.676 m)  Wt 114.306 kg (252 lb)  BMI 40.69 kg/m2  SpO2 99%  LMP 12/26/2014 (Approximate) EFM: 145/moderate/accel+/decel-  CVE: Dilation: 2.5 Effacement (%): 70 Cervical Position: Anterior Station: -2 Presentation: Vertex (confirmed by US ) Exam by:: Dr. Nancy MarusMayo    A&P: 35 y.o. [id number] 7523w5d SROM. #Labor: Continue pitocin and monitor FWB #Pain: Undecided about epidural at a later time. #FWB: Category 1 #GBS: Continue penicillin dosage q4h until delivery  Lucretia KernJohn Diehl, Med Student 8:10 PM  I evaluated the patient and agree with the medical student's note.

## 2015-05-20 NOTE — Progress Notes (Signed)
Asked by RN to place internal EFM d/t difficulty tracing w/movement.  Cx 4/100/-2.  Pt is prepping for epidural.  FHR  Cat 1.

## 2015-05-20 NOTE — Progress Notes (Signed)
Bonnie Mcneil is a 35 y.o. [id number] at 7679w5d by 20 wk ultrasound admitted for rupture of membranes  Subjective: Pt is doing well. She states she is not feeling strong contractions. She had to have labor augmentation with each of her three previous deliveries.   Objective: BP 120/80 mmHg  Pulse 83  Temp(Src) 97.9 F (36.6 C) (Oral)  Resp 20  Ht 5\' 6"  (1.676 m)  Wt 252 lb (114.306 kg)  BMI 40.69 kg/m2  SpO2 99%  LMP 12/26/2014 (Approximate)      FHT:  FHR: 140 bpm, variability: moderate,  accelerations:  Present,  decelerations:  Absent UC:   irregular, every 10 minutes SVE:   Dilation: 2.5 Effacement (%): 70 Station: -2 Exam by:: DR. Lambert KetoMayo, Jane Bailey, RN  Labs: Lab Results  Component Value Date   WBC 11.9* 05/19/2015   HGB 12.3 05/19/2015   HCT 35.6* 05/19/2015   MCV 85.8 05/19/2015   PLT 223 05/19/2015    Assessment / Plan: Spontaneous labor, progressing slowly. Pt is not having regular contractions. Discussed augmentation with Pitocin vs nipple stimulation. Pt would like to start Pitocin.  Labor: Progressing slowly. Preeclampsia:  no signs or symptoms of toxicity Fetal Wellbeing:  Category I Pain Control:  Desires epidural I/D:  n/a Anticipated MOD:  NSVD  Jinny BlossomKaty D Mayo 05/20/2015, 3:35 PM

## 2015-05-20 NOTE — Anesthesia Preprocedure Evaluation (Addendum)
Anesthesia Evaluation  Patient identified by MRN, date of birth, ID band Patient awake    Reviewed: Allergy & Precautions, NPO status , Patient's Chart, lab work & pertinent test results  Airway Mallampati: II  TM Distance: >3 FB Neck ROM: Full    Dental  (+) Teeth Intact, Dental Advisory Given   Pulmonary neg pulmonary ROS,    Pulmonary exam normal breath sounds clear to auscultation       Cardiovascular negative cardio ROS Normal cardiovascular exam Rhythm:Regular Rate:Normal     Neuro/Psych negative neurological ROS  negative psych ROS   GI/Hepatic negative GI ROS, Neg liver ROS,   Endo/Other  Morbid obesity  Renal/GU negative Renal ROS     Musculoskeletal negative musculoskeletal ROS (+)   Abdominal   Peds  Hematology negative hematology ROS (+) Plt 223k   Anesthesia Other Findings Day of surgery medications reviewed with the patient.  Reproductive/Obstetrics (+) Pregnancy                             Anesthesia Physical Anesthesia Plan  ASA: III  Anesthesia Plan: Epidural   Post-op Pain Management:    Induction:   Airway Management Planned:   Additional Equipment:   Intra-op Plan:   Post-operative Plan:   Informed Consent: I have reviewed the patients History and Physical, chart, labs and discussed the procedure including the risks, benefits and alternatives for the proposed anesthesia with the patient or authorized representative who has indicated his/her understanding and acceptance.   Dental advisory given  Plan Discussed with:   Anesthesia Plan Comments: (Patient identified. Risks/Benefits/Options discussed with patient including but not limited to bleeding, infection, nerve damage, paralysis, failed block, incomplete pain control, headache, blood pressure changes, nausea, vomiting, reactions to medication both or allergic, itching and postpartum back pain.  Confirmed with bedside nurse the patient's most recent platelet count. Confirmed with patient that they are not currently taking any anticoagulation, have any bleeding history or any family history of bleeding disorders. Patient expressed understanding and wished to proceed. All questions were answered. )        Anesthesia Quick Evaluation

## 2015-05-20 NOTE — Anesthesia Procedure Notes (Signed)
Epidural Patient location during procedure: OB  Staffing Anesthesiologist: TURK, STEPHEN EDWARD Performed by: anesthesiologist   Preanesthetic Checklist Completed: patient identified, pre-op evaluation, timeout performed, IV checked, risks and benefits discussed and monitors and equipment checked  Epidural Patient position: sitting Prep: DuraPrep Patient monitoring: blood pressure and continuous pulse ox Approach: midline Location: L3-L4 Injection technique: LOR air  Needle:  Needle type: Tuohy  Needle gauge: 17 G Needle length: 9 cm Needle insertion depth: 6 cm Catheter size: 19 Gauge Catheter at skin depth: 11 cm Test dose: negative and Other (1% Lidocaine)  Additional Notes Patient identified.  Risk benefits discussed including failed block, incomplete pain control, headache, nerve damage, paralysis, blood pressure changes, nausea, vomiting, reactions to medication both toxic or allergic, and postpartum back pain.  Patient expressed understanding and wished to proceed.  All questions were answered.  Sterile technique used throughout procedure and epidural site dressed with sterile barrier dressing. No paresthesia or other complications noted. The patient did not experience any signs of intravascular injection such as tinnitus or metallic taste in mouth nor signs of intrathecal spread such as rapid motor block. Please see nursing notes for vital signs. Reason for block:procedure for pain   

## 2015-05-21 ENCOUNTER — Encounter (HOSPITAL_COMMUNITY): Payer: Self-pay | Admitting: *Deleted

## 2015-05-21 LAB — CCBB MATERNAL DONOR DRAW

## 2015-05-21 LAB — RPR: RPR: NONREACTIVE

## 2015-05-21 MED ORDER — BISACODYL 10 MG RE SUPP: 10.0000 mg | Freq: Every day | RECTAL | Status: DC | PRN

## 2015-05-21 MED ORDER — LANOLIN HYDROUS EX OINT: TOPICAL_OINTMENT | CUTANEOUS | Status: DC | PRN

## 2015-05-21 MED ORDER — IBUPROFEN 600 MG PO TABS
600.0000 mg | ORAL_TABLET | Freq: Four times a day (QID) | ORAL | Status: DC
  Administered 2015-05-21 – 2015-05-22 (×6): 600 mg via ORAL
  Filled 2015-05-21 (×6): qty 1

## 2015-05-21 MED ORDER — DIBUCAINE 1 % RE OINT: 1.0000 "application " | TOPICAL_OINTMENT | RECTAL | Status: DC | PRN

## 2015-05-21 MED ORDER — PRENATAL MULTIVITAMIN CH
1.0000 | ORAL_TABLET | Freq: Every day | ORAL | Status: DC
Start: 2015-05-21 — End: 2015-05-22
  Administered 2015-05-21 – 2015-05-22 (×2): 1 via ORAL
  Filled 2015-05-21 (×2): qty 1

## 2015-05-21 MED ORDER — ACETAMINOPHEN 325 MG PO TABS: 650.0000 mg | ORAL_TABLET | ORAL | Status: DC | PRN

## 2015-05-21 MED ORDER — METHYLERGONOVINE MALEATE 0.2 MG/ML IJ SOLN: 0.2000 mg | INTRAMUSCULAR | Status: DC | PRN

## 2015-05-21 MED ORDER — DIPHENHYDRAMINE HCL 25 MG PO CAPS: 25.0000 mg | ORAL_CAPSULE | Freq: Four times a day (QID) | ORAL | Status: DC | PRN

## 2015-05-21 MED ORDER — ONDANSETRON HCL 4 MG PO TABS: 4.0000 mg | ORAL_TABLET | ORAL | Status: DC | PRN

## 2015-05-21 MED ORDER — WITCH HAZEL-GLYCERIN EX PADS: 1.0000 "application " | MEDICATED_PAD | CUTANEOUS | Status: DC | PRN

## 2015-05-21 MED ORDER — SENNOSIDES-DOCUSATE SODIUM 8.6-50 MG PO TABS
2.0000 | ORAL_TABLET | ORAL | Status: DC
Start: 2015-05-21 — End: 2015-05-22
  Administered 2015-05-22: 2 via ORAL
  Filled 2015-05-21: qty 2

## 2015-05-21 MED ORDER — FLEET ENEMA 7-19 GM/118ML RE ENEM: 1.0000 | ENEMA | Freq: Every day | RECTAL | Status: DC | PRN

## 2015-05-21 MED ORDER — ONDANSETRON HCL 4 MG/2ML IJ SOLN: 4.0000 mg | INTRAMUSCULAR | Status: DC | PRN

## 2015-05-21 MED ORDER — SIMETHICONE 80 MG PO CHEW: 80.0000 mg | CHEWABLE_TABLET | ORAL | Status: DC | PRN

## 2015-05-21 MED ORDER — ZOLPIDEM TARTRATE 5 MG PO TABS: 5.0000 mg | ORAL_TABLET | Freq: Every evening | ORAL | Status: DC | PRN

## 2015-05-21 MED ORDER — MEASLES, MUMPS & RUBELLA VAC ~~LOC~~ INJ
0.5000 mL | INJECTION | Freq: Once | SUBCUTANEOUS | Status: DC
  Filled 2015-05-21: qty 0.5

## 2015-05-21 MED ORDER — BENZOCAINE-MENTHOL 20-0.5 % EX AERO
1.0000 "application " | INHALATION_SPRAY | CUTANEOUS | Status: DC | PRN
  Administered 2015-05-21: 1 via TOPICAL
  Filled 2015-05-21: qty 56

## 2015-05-21 MED ORDER — FERROUS SULFATE 325 (65 FE) MG PO TABS
325.0000 mg | ORAL_TABLET | Freq: Two times a day (BID) | ORAL | Status: DC
  Administered 2015-05-21 – 2015-05-22 (×3): 325 mg via ORAL
  Filled 2015-05-21 (×3): qty 1

## 2015-05-21 MED ORDER — TETANUS-DIPHTH-ACELL PERTUSSIS 5-2.5-18.5 LF-MCG/0.5 IM SUSP: 0.5000 mL | Freq: Once | INTRAMUSCULAR | Status: DC

## 2015-05-21 MED ORDER — METHYLERGONOVINE MALEATE 0.2 MG PO TABS: 0.2000 mg | ORAL_TABLET | ORAL | Status: DC | PRN

## 2015-05-21 NOTE — Discharge Instructions (Signed)
Por favor, programe una cita de seguimiento para la visita post-parto que se ver en 6 semanas. Nada por vagina durante las prximas 6 semanas. Sin relaciones sexuales, sin tampones, sin duchas vaginales.  Cuidados en el postparto luego de un parto vaginal  (Postpartum Care After Vaginal Delivery) Despus del parto (perodo de postparto), la estada normal en el hospital es de 24-72 horas. Si hubo problemas con el trabajo de parto o el parto, o si tiene otros problemas mdicos, es posible que Hydrologist en el hospital por ms [place].  Mientras est en el hospital, recibir [place] e instrucciones sobre cmo cuidar de usted misma y de su beb recin nacido durante el postparto.  Mientras est en el hospital:   Asegrese de decirle a las enfermeras si siente dolor o Dentist, as como donde Medical laboratory scientific officer y Training and development officer.  Si usted tuvo una incisin cerca de la vagina (episiotoma) o si ha tenido Armed forces technical officer, las enfermeras le pondrn hielo sobre la episiotoma o Art therapist. Las bolsas de hielo pueden ayudar a Glass blower/designer y la hinchazn.  Si est amamantando, puede sentir contracciones dolorosas en el tero durante algunas semanas. Esto es normal. Las contracciones ayudan a que el tero vuelva a su tamao normal.  Es normal tener algo de sangrado despus del [place].  Durante los primeros 1-3 das despus del parto, el flujo es de color rojo y la cantidad puede ser similar a un perodo.  Es frecuente que el flujo se inicie y se Chief Strategy Officer.  En los primeros [place], puede eliminar algunos cogulos pequeos. Informe a las enfermeras si elimina cogulos grandes o aumenta el flujo.  No  elimine los cogulos de sangre por el inodoro antes de que la [employer] vea.  Durante los prximos 3 a [address] despus del parto, el flujo debe ser ms acuoso y rosado o Child psychotherapist.  [place] a catorce [employer] del parto, el flujo debe ser una pequea cantidad de secrecin de color  blanco amarillento.  La cantidad de flujo disminuir en las primeras semanas despus del parto. El flujo puede detenerse en 6-8 semanas. La mayora de las mujeres no tienen ms flujo a las 12 semanas despus del [place].  Usted debe cambiar sus apsitos con frecuencia.  Lvese bien las manos con agua y jabn durante al menos 20 segundos despus de cambiar el apsito, usar el bao o antes de Occupational psychologist o Corporate treasurer a su recin nacido.  Usted podr sentir como que tiene que vaciar la vejiga durante las primeras 6-8 horas despus del [place].  En caso de que sienta debilidad, mareo o [place], llame a la enfermera antes de levantarse de la cama por primera vez y antes de tomar una ducha por primera vez.  Dentro de los [employer] del parto, sus mamas pueden comenzar a estar sensibles y [place]. Esto se llama congestin. La sensibilidad en los senos por lo general desaparece dentro de las 48-72 horas despus de que ocurre la congestin. Tambin puede notar que la [place] se escapa de sus senos. Si no est amamantando no estimule sus pechos. La estimulacin de las mamas hace que sus senos produzcan ms [place].  Pasar tanto tiempo como le sea posible con el beb recin nacido es muy importante. Durante ese tiempo, usted y su beb deben sentirse cerca y conocerse uno al otro. Tener al beb en su habitacin (alojamiento conjunto) ayudar a fortalecer el vnculo con el beb recin nacido.Esto le  dar tiempo para conocerlo y atenderlo de [place] cmoda.  Las hormonas se modifican despus del parto. A veces, los cambios hormonales pueden causar tristeza o ganas de llorar por un tiempo. Estos sentimientos no deben durar ms de Hughes Supplyunos pocos das. Si duran ms que eso, debe hablar con su mdico.  Si lo desea, hable con su mdico acerca de los mtodos de planificacin familiar o mtodos anticonceptivos.  Hable con su mdico acerca de las vacunas. El mdico puede indicarle que se aplique las siguientes vacunas antes  de salir del hospital:  [place] contra el ttanos, la difteria y la tos ferina (Tdap) o el ttanos y la difteria (Td). Es muy importante que usted y su familia (incluyendo a los abuelos) u otras personas que cuidan al recin nacido estn al da con las vacunas Tdap o Td. Las vacunas Tdap o Td pueden ayudar a proteger al recin nacido de enfermedades.  Inmunizacin contra la rubola.  Inmunizacin contra la varicela.  Inmunizacin contra la gripe. Usted debe recibir esta vacunacin anual si no la ha recibido Academic librariandurante el embarazo.   Esta informacin no tiene Theme park managercomo fin reemplazar el consejo del mdico. Asegrese de hacerle al mdico cualquier pregunta que tenga.   Document Released: 11/27/2006 Document Revised: 10/25/2011 Elsevier Interactive Patient Education [employer]2016 Elsevier Inc.

## 2015-05-21 NOTE — Progress Notes (Signed)
UR chart review completed.  

## 2015-05-21 NOTE — Progress Notes (Signed)
Post Partum Day 1 Subjective: no complaints, up ad lib, voiding, tolerating PO and + flatus  Objective: Blood pressure 120/77, pulse 87, temperature 98 F (36.7 C), temperature source Oral, resp. rate 18, height 5\' 6"  (1.676 m), weight 252 lb (114.306 kg), last menstrual period 12/26/2014, SpO2 98 %, unknown if currently breastfeeding.  Physical Exam:  General: alert and no distress Lochia: appropriate Uterine Fundus: firm Incision: n/a DVT Evaluation: No evidence of DVT seen on physical exam.   Recent Labs  05/19/15 2145  HGB 12.3  HCT 35.6*    Assessment/Plan: Plan for discharge tomorrow   LOS: 2 days   [name] 05/21/2015, 9:04 AM   CNM attestation Post Partum Day #1 I have seen and examined this patient and agree with above documentation in the resident's note.   [name] is a 35 y.o. [id number] s/p SVD.  Pt denies problems with ambulating, voiding or po intake. Pain is well controlled.  Plan for birth control is oral progesterone-only contraceptive.  Method of Feeding: breast  PE:  BP 124/73 mmHg  Pulse 78  Temp(Src) 98.4 F (36.9 C) (Oral)  Resp 16  Ht 5\' 6"  (1.676 m)  Wt 114.306 kg (252 lb)  BMI 40.69 kg/m2  SpO2 98%  LMP 12/26/2014 (Approximate)  Breastfeeding? Unknown Fundus firm  Plan for discharge: 05/22/15  Cam HaiSHAW, KIMBERLY, CNM 3:23 PM  05/21/2015

## 2015-05-21 NOTE — Anesthesia Postprocedure Evaluation (Signed)
Anesthesia Post Note  Patient: Bonnie LeftCynthia L Mcneil  Procedure(s) Performed: * No procedures listed *  Patient location during evaluation: Mother Baby Anesthesia Type: Epidural Level of consciousness: awake, awake and alert, oriented and patient cooperative Pain management: pain level controlled Vital Signs Assessment: post-procedure vital signs reviewed and stable Respiratory status: spontaneous breathing, nonlabored ventilation and respiratory function stable Cardiovascular status: stable Postop Assessment: no headache, no backache, patient able to bend at knees and no signs of nausea or vomiting Anesthetic complications: no    Last Vitals:  Filed Vitals:   05/21/15 0558 05/21/15 0600  BP: 119/96 120/77  Pulse: 82 87  Temp: 36.7 C   Resp: 18     Last Pain:  Filed Vitals:   05/21/15 0626  PainSc: 0-No pain                 Serenity Fortner L

## 2015-05-22 MED ORDER — IBUPROFEN 600 MG PO TABS: 600.0000 mg | ORAL_TABLET | Freq: Four times a day (QID) | ORAL | Status: DC

## 2015-05-22 MED ORDER — NORETHINDRONE 0.35 MG PO TABS: 1.0000 | ORAL_TABLET | Freq: Every day | ORAL | Status: DC

## 2015-05-22 NOTE — Discharge Summary (Signed)
OB Discharge Summary     Patient Name: Bonnie Mcneil DOB: April 30, 1980 MRN: [id number]  Date of admission: 05/19/2015 Delivering MD: Jacklyn Shell   Date of discharge: 05/22/2015  Admitting diagnosis: 37w water with blood Intrauterine pregnancy: [redacted]w[redacted]d     Secondary diagnosis:  Active Problems:   Amniotic fluid leaking  Additional problems: none     Discharge diagnosis: Term Pregnancy Delivered                                                                                                Post partum procedures:none  Augmentation: Pitocin and Cytotec  Complications: None  Hospital course:  Induction of Labor With Vaginal Delivery   35 y.o. yo [id number] at [redacted]w[redacted]d was admitted to the hospital 05/19/2015 for induction of labor.  Indication for induction: leaking of amniotic fluid.  Patient had an uncomplicated labor course as follows: Membrane Rupture Time/Date: 6:50 PM ,05/19/2015   Intrapartum Procedures: Episiotomy: None [1]                                         Lacerations:  1st degree [2];Perineal [11]  Patient had delivery of a Viable infant.  Information for the patient's newborn:  Teretha, Chalupa [[id number]]  Delivery Method: Vaginal, Spontaneous Delivery (Filed from Delivery Summary)    05/20/2015  Details of delivery can be found in separate delivery note.  Patient had a routine postpartum course. Patient is discharged home 05/22/2015.   Physical exam  Filed Vitals:   05/21/15 0600 05/21/15 1112 05/21/15 1805 05/22/15 0558  BP: 120/77 124/73 120/75 114/72  Pulse: 87 78 78 66  Temp:  98.4 F (36.9 C) 98.1 F (36.7 C) 98.2 F (36.8 C)  TempSrc:  Oral Oral Oral  Resp:  Height:      Weight:      SpO2:    96%   General: alert, cooperative and no distress Lochia: appropriate Uterine Fundus: firm Incision: N/A DVT Evaluation: No evidence of DVT seen on physical exam. Negative Homan's sign. No cords or calf tenderness. No significant  calf/ankle edema. Labs: Lab Results  Component Value Date   WBC 11.9* 05/19/2015   HGB 12.3 05/19/2015   HCT 35.6* 05/19/2015   MCV 85.8 05/19/2015   PLT 223 05/19/2015   CMP Latest Ref Rng 12/08/2012  Glucose 70 - 99 mg/dL 784(O)  BUN 6 - 23 mg/dL 7  Creatinine 9.62 - 9.52 mg/dL 8.41  Sodium 324 - 401 mEq/L 135  Potassium 3.5 - 5.1 mEq/L 3.6  Chloride 96 - 112 mEq/L 99  CO2 19 - 32 mEq/L 24  Calcium 8.4 - 10.5 mg/dL 9.3  Total Protein 6.0 - 8.3 g/dL 7.7  Total Bilirubin 0.3 - 1.2 mg/dL 0.7  Alkaline Phos 39 - 117 U/L 62  AST 0 - 37 U/L 16  ALT 0 - 35 U/L 18    Discharge instruction: per After Visit Summary and "Baby and Me Booklet".  After visit meds:  Medication List    TAKE these medications        ibuprofen 600 MG tablet  Commonly known as:  ADVIL,MOTRIN  Take 1 tablet (600 mg total) by mouth every 6 (six) hours.     norethindrone 0.35 MG tablet  Commonly known as:  ORTHO MICRONOR  Take 1 tablet (0.35 mg total) by mouth daily.     PRENATAL VITAMIN PO  Take 1 tablet by mouth daily.        Diet: routine diet  Activity: Advance as tolerated. Pelvic rest for 6 weeks.   Outpatient follow up:6 weeks Follow up Appt: Future Appointments Date Time Provider Department Center  05/26/2015 9:00 AM Tereso NewcomerUgonna A Anyanwu, MD [facility]   Follow up Visit:No Follow-up on file.  Postpartum contraception: Progesterone only pills  Newborn Data: Live born female  Birth Weight: 6 lb 6.5 oz (2906 g) APGAR: 8, 9  Baby Feeding: Breast Disposition:home with mother   05/22/2015 Delynn FlavinAshly Gottschalk, DO   OB FELLOW DISCHARGE ATTESTATION  I have seen and examined this patient and agree with above documentation in the resident's note.   Silvano BilisNoah B Chessa Barrasso, MD 11:45 AM

## 2015-05-26 ENCOUNTER — Encounter: Payer: Self-pay | Admitting: Obstetrics & Gynecology

## 2015-06-30 ENCOUNTER — Ambulatory Visit (INDEPENDENT_AMBULATORY_CARE_PROVIDER_SITE_OTHER): Payer: Medicaid Other | Admitting: Obstetrics & Gynecology

## 2015-06-30 ENCOUNTER — Encounter: Payer: Self-pay | Admitting: Obstetrics & Gynecology

## 2015-06-30 VITALS — BP 115/75 | HR 91 | Resp 18 | Ht 67.0 in | Wt 242.0 lb

## 2015-06-30 DIAGNOSIS — Z124 Encounter for screening for malignant neoplasm of cervix: Secondary | ICD-10-CM

## 2015-06-30 DIAGNOSIS — Z1151 Encounter for screening for human papillomavirus (HPV): Secondary | ICD-10-CM

## 2015-06-30 DIAGNOSIS — Z3009 Encounter for other general counseling and advice on contraception: Secondary | ICD-10-CM

## 2015-06-30 DIAGNOSIS — Z30011 Encounter for initial prescription of contraceptive pills: Secondary | ICD-10-CM

## 2015-06-30 MED ORDER — NORETHIN ACE-ETH ESTRAD-FE 1.5-30 MG-MCG PO TABS: 1.0000 | ORAL_TABLET | Freq: Every day | ORAL | Status: DC

## 2015-06-30 NOTE — Progress Notes (Signed)
Patient ID: Bonnie Mcneil, female   DOB: [birth year], 35 y.o.   MRN: [id number] Post Partum Exam  Bonnie Mcneil is a 35 y.o. [id number] female who presents for a postpartum visit. She is 6 weeks postpartum following a spontaneous vaginal delivery. I have fully reviewed the prenatal and intrapartum course. The delivery was at 37 gestational weeks.  Anesthesia: epidural. Postpartum course has been unremarkable. Baby's course has been unremarkable. Baby is feeding by bottle - Similac Alimentum. Bleeding no bleeding. Bowel function is normal. Bladder function is normal. Patient is not sexually active. Desires contraception method is OCP (estrogen/progesterone). Postpartum depression screening: negative.  The following portions of the patient's history were reviewed and updated as appropriate: allergies, current medications, past family history, past medical history, past social history, past surgical history and problem list.  Review of Systems Pertinent items are noted in HPI.   Objective:    BP 116/78 mmHg  Pulse 78  Resp 16  Ht 5\' 5"  (1.651 m)  Wt 211 lb (95.709 kg)  BMI 35.11 kg/m2  Breastfeeding? Yes  General:  alert and no distress   Breasts:  Not done  Lungs: clear to auscultation bilaterally  Heart:  regular rate and rhythm, S1, S2 normal, no murmur, click, rub or gallop  Abdomen: soft, non-tender; bowel sounds normal; no masses,  no organomegaly   Vulva:  normal  Vagina: normal vagina, no discharge, exudate, lesion, or erythema  Cervix:  no bleeding following Pap and no cervical motion tenderness  Corpus: normal size, contour, position, consistency, mobility, non-tender  Adnexa:  no mass, fullness, tenderness  Rectal Exam: Not performed.        Assessment:    6 weeks postpartum exam. Pap smear done at today's visit.  Contraception counseling  Plan:    1. Contraception: OCP (estrogen/progesterone)- LoEstrin 1/30 1 po q day. Use condoms for 2 weeks 2. No contraindications to  OCPS. 3. Follow up in: 1 year or sooner prn or as needed.

## 2015-06-30 NOTE — Patient Instructions (Signed)
Oral Contraception Use Oral contraceptive pills (OCPs) are medicines taken to prevent pregnancy. OCPs work by preventing the ovaries from releasing eggs. The hormones in OCPs also cause the cervical mucus to thicken, preventing the sperm from entering the uterus. The hormones also cause the uterine lining to become thin, not allowing a fertilized egg to attach to the inside of the uterus. OCPs are highly effective when taken exactly as prescribed. However, OCPs do not prevent sexually transmitted diseases (STDs). Safe sex practices, such as using condoms along with an OCP, can help prevent STDs. Before taking OCPs, you may have a physical exam and Pap test. Your health care provider may also order blood tests if necessary. Your health care provider will make sure you are a good candidate for oral contraception. Discuss with your health care provider the possible side effects of the OCP you may be prescribed. When starting an OCP, it can take 2 to 3 months for the body to adjust to the changes in hormone levels in your body.  HOW TO TAKE ORAL CONTRACEPTIVE PILLS Your health care provider may advise you on how to start taking the first cycle of OCPs. Otherwise, you can:   Start on day 1 of your menstrual period. You will not need any backup contraceptive protection with this start time.   Start on the first Sunday after your menstrual period or the day you get your prescription. In these cases, you will need to use backup contraceptive protection for the first week.   Start the pill at any time of your cycle. If you take the pill within 5 days of the start of your period, you are protected against pregnancy right away. In this case, you will not need a backup form of birth control. If you start at any other time of your menstrual cycle, you will need to use another form of birth control for 7 days. If your OCP is the type called a minipill, it will protect you from pregnancy after taking it for 2 days (48  hours). After you have started taking OCPs:   If you forget to take 1 pill, take it as soon as you remember. Take the next pill at the regular time.   If you miss 2 or more pills, call your health care provider because different pills have different instructions for missed doses. Use backup birth control until your next menstrual period starts.   If you use a 28-day pack that contains inactive pills and you miss 1 of the last 7 pills (pills with no hormones), it will not matter. Throw away the rest of the non-hormone pills and start a new pill pack.  No matter which day you start the OCP, you will always start a new pack on that same day of the week. Have an extra pack of OCPs and a backup contraceptive method available in case you miss some pills or lose your OCP pack.  HOME CARE INSTRUCTIONS   Do not smoke.   Always use a condom to protect against STDs. OCPs do not protect against STDs.   Use a calendar to mark your menstrual period days.   Read the information and directions that came with your OCP. Talk to your health care provider if you have questions.  SEEK MEDICAL CARE IF:   You develop nausea and vomiting.   You have abnormal vaginal discharge or bleeding.   You develop a rash.   You miss your menstrual period.   You are losing   your hair.   You need treatment for mood swings or depression.   You get dizzy when taking the OCP.   You develop acne from taking the OCP.   You become pregnant.  SEEK IMMEDIATE MEDICAL CARE IF:   You develop chest pain.   You develop shortness of breath.   You have an uncontrolled or severe headache.   You develop numbness or slurred speech.   You develop visual problems.   You develop pain, redness, and swelling in the legs.    This information is not intended to replace advice given to you by your health care provider. Make sure you discuss any questions you have with your health care provider.   Document  Released: 01/19/2011 Document Revised: 02/20/2014 Document Reviewed: 07/21/2012 Elsevier Interactive Patient Education 2016 Elsevier Inc.  

## 2015-07-01 LAB — CYTOLOGY - PAP

## 2015-08-23 ENCOUNTER — Ambulatory Visit (HOSPITAL_COMMUNITY)
Admission: EM | Admit: 2015-08-23 | Discharge: 2015-08-23 | Disposition: A | Discharge status: Home / Self Care | Payer: Self-pay | Attending: Family Medicine | Admitting: Family Medicine

## 2015-08-23 ENCOUNTER — Encounter (HOSPITAL_COMMUNITY): Payer: Self-pay | Admitting: Emergency Medicine

## 2015-08-23 DIAGNOSIS — M542 Cervicalgia: Secondary | ICD-10-CM

## 2015-08-23 DIAGNOSIS — R509 Fever, unspecified: Secondary | ICD-10-CM

## 2015-08-23 DIAGNOSIS — R300 Dysuria: Secondary | ICD-10-CM

## 2015-08-23 DIAGNOSIS — N390 Urinary tract infection, site not specified: Secondary | ICD-10-CM

## 2015-08-23 LAB — POCT URINALYSIS DIP (DEVICE)
BILIRUBIN URINE: NEGATIVE
Glucose, UA: NEGATIVE mg/dL
Ketones, ur: NEGATIVE mg/dL
Nitrite: NEGATIVE
PH: 6 (ref 5.0–8.0)
Protein, ur: 30 mg/dL — AB
SPECIFIC GRAVITY, URINE: 1.01 (ref 1.005–1.030)
Urobilinogen, UA: 1 mg/dL (ref 0.0–1.0)

## 2015-08-23 MED ORDER — CIPROFLOXACIN HCL 500 MG PO TABS: 500.0000 mg | ORAL_TABLET | Freq: Two times a day (BID) | ORAL | Status: DC

## 2015-08-23 MED ORDER — CYCLOBENZAPRINE HCL 10 MG PO TABS: 10.0000 mg | ORAL_TABLET | Freq: Two times a day (BID) | ORAL | Status: DC | PRN

## 2015-08-23 NOTE — ED Notes (Signed)
[facility] a [facility] of lower back pain and dysuria as well as fever and neck pain x 6 days. The patient stated that she was running a fever of 101.1 [facility] and she took ibuprofen.

## 2015-08-23 NOTE — ED Provider Notes (Signed)
CSN: [id number]     Arrival date & time 08/23/15  1839 History   None    Chief Complaint  Bonnie Mcneil presents with  . Dysuria  . Back Pain  . Torticollis   (Consider location/radiation/quality/duration/timing/severity/associated sxs/prior Treatment) Bonnie Mcneil is a 35 y.o. female presenting with dysuria and back pain. The history is provided by the Bonnie Mcneil.  Dysuria Pain quality:  Aching Pain severity:  Moderate Onset quality:  Gradual Duration:  6 days Timing:  Constant Progression:  Worsening Chronicity:  New Recent urinary tract infections: no   Relieved by:  Nothing Worsened by:  Nothing tried Ineffective treatments:  None tried Urinary symptoms: discolored urine   Associated symptoms: fever   Back Pain Associated symptoms: dysuria and fever     Past Medical History  Diagnosis Date  . Cholecystitis 2008  . Intrauterine pregnancy 2004, 2008  . Therapeutic abortion in first trimester 2008    secondary to concern for baby during cholecystecomy  . Chlamydia 2004   Past Surgical History  Procedure Laterality Date  . Cholecystectomy  2008   Family History  Problem Relation Age of Onset  . Anesthesia problems Neg Hx   . Malignant hyperthermia Neg Hx   . Pseudochol deficiency Neg Hx   . Hypotension Mother    Social History  Substance Use Topics  . Smoking status: Never Smoker   . Smokeless tobacco: Never Used  . Alcohol Use: No   OB History    Gravida Para Term Preterm AB TAB SAB Ectopic Multiple Living   5 4 4  0 1 1 0 0 0 4     Review of Systems  Constitutional: Positive for fever.  Eyes: Negative.   Genitourinary: Positive for dysuria.  Musculoskeletal: Positive for back pain.  Skin: Negative.   Allergic/Immunologic: Negative.   Neurological: Negative.   Hematological: Negative.     Allergies  Review of Bonnie Mcneil's allergies indicates no known allergies.  Home Medications   Prior to Admission medications   Medication Sig Start Date End Date Taking?  Authorizing Provider  norethindrone-ethinyl estradiol-iron (MICROGESTIN FE,GILDESS FE,LOESTRIN FE) 1.5-30 MG-MCG tablet Take 1 tablet by mouth daily. 06/30/15  Yes Willodean Rosenthalarolyn Harraway-Smith, MD  folic acid (FOLVITE) 1 MG tablet Take 1 mg by mouth daily.    Historical Provider, MD  Multiple Vitamins-Minerals (MULTIVITAMIN ADULT PO) Take 1 tablet by mouth daily.    Historical Provider, MD   Meds Ordered and Administered this Visit  Medications - No data to display  BP 122/51 mmHg  Pulse 102  Temp(Src) 97.9 F (36.6 C) (Oral)  Resp 20  SpO2 100%  LMP 08/23/2015 (Exact Date)  Breastfeeding? No No data found.   Physical Exam  Constitutional: She appears well-developed and well-nourished.  HENT:  Head: Normocephalic.  Right Ear: External ear normal.  Mouth/Throat: Oropharynx is clear and moist.  Eyes: Conjunctivae are normal. Pupils are equal, round, and reactive to light.  Neck:  TTP cervical paraspinous muscles and decreased ROM cervical spine.    ED Course  Procedures (including critical care time)  Labs Review Labs Reviewed - No data to display  Imaging Review No results found.   Visual Acuity Review  Right Eye Distance:   Left Eye Distance:   Bilateral Distance:    Right Eye Near:   Left Eye Near:    Bilateral Near:        MDM  Dysuria Fever UTI Cervicalgia  Push po fluids, rest, tylenol and motrin otc prn as directed for fever, arthralgias, and  myalgias.  Follow up prn if sx's continue or persist.  Cipro  one po bid x 10 days#20 Flexeril  one po bid prn #20     Deatra Canter, FNP 08/23/15 2042

## 2015-08-23 NOTE — Discharge Instructions (Signed)
Antibiticos (Antibiotic Medicine) Los antibiticos se utilizan para tratar las infecciones causadas por bacterias. Estos medicamentos daan o matan a los grmenes que producen enfermedades. CMO SE ELEGIR EL MEDICAMENTO? Hay muchos tipos de antibiticos. Para ayudar al mdico a escoger uno, infrmele lo siguiente:  Si tiene Nigeralguna alergia.  Si est embarazada o planea estarlo.  Si est amamantando.  Si est tomando algn medicamento. Por ejemplo, medicamentos recetados y de [address], as como remedios a base de hierbas.  Si tiene una enfermedad o un problema. Si tiene dudas sobre por qu se eligi el medicamento, pregunte. CUNTO TIEMPO DEBO TOMAR EL MEDICAMENTO? Tome el medicamento durante el tiempo que le indique el mdico. Aunque se sienta mejor, no deje de tomarlo. Si deja de tomarlo demasiado pronto:  Puede empezar a sentirse mal nuevamente.  Puede ser ms difcil tratar la infeccin.  Pueden aparecer nuevos problemas. QU SUCEDE SI OMITO UNA DOSIS? Intente no omitir ninguna dosis del antibitico. Si omite una dosis:  Tmela tan pronto como pueda.  Si est tomando 2dosis diarias, tome la siguiente dosis a las 5 o [address]horas.  Si est tomando 3o ms dosis diarias, tome la siguiente dosis a las 2 o 4horas. Luego tome las dosis en los horarios normales. Si no puede tomar una dosis que se salte, tome la siguiente dosis en el horario correspondiente. A continuacin, tome la dosis que se salte despus de haber tomado todas las dosis como se lo haya indicado el mdico, como si le faltara tomar una dosis ms. ESTE MEDICAMENTO AFECTA LA ANTICONCEPCIN? Es posible que los anticonceptivos no resulten eficaces mientras se toman antibiticos. Si est tomando pldoras anticonceptivas, contine tomndolas como siempre. Use un segundo mtodo anticonceptivo, por ejemplo, un preservativo. Siga utilizando el segundo mtodo anticonceptivo hasta terminar el ciclo actual de 1mes de  pldoras anticonceptivas. SOLICITE AYUDA SI:  Empeora.  No se siente mejor despus de [address] das de [address] [place] empezado a Designer, industrial/producttomar el medicamento.  Vomita.  Le aparecen placas blancas en la boca.  Tiene un dolor articular nuevo despus de empezar a Designer, industrial/producttomar el medicamento.  Tiene dolores musculares nuevos despus de Corporate investment bankerempezar a Designer, industrial/producttomar el medicamento.  Tuvo fiebre antes de Corporate investment bankerempezar a Designer, industrial/producttomar el medicamento, y este sntoma regresa.  Tiene sntomas de Runner, broadcasting/film/videouna reaccin alrgica, como una erupcin cutnea que le produce picazn. Si esto ocurre, deje de Designer, industrial/producttomar el medicamento. SOLICITE AYUDA DE INMEDIATO SI:  La orina se vuelve oscura o de color sangre.  La piel se vuelve amarillenta.  Le salen hematomas o sangra fcilmente.  Tiene diarrea muy intensa y clicos abdominales.  Siente un dolor de cabeza muy intenso.  Tiene signos de Runner, broadcasting/film/videouna reaccin alrgica muy grave, por ejemplo:  Problemas respiratorios.  Sibilancias.  Hinchazn de los labios, la lengua o el rostro.  Desmayos.  Ampollas en la piel o en la boca. Si tiene signos de Runner, broadcasting/film/videouna reaccin alrgica muy grave, deje de tomar el antibitico de inmediato.   Esta informacin no tiene Theme park managercomo fin reemplazar el consejo del mdico. Asegrese de hacerle al mdico cualquier pregunta que tenga.   Document Released: 03/04/2010 Document Revised: 10/21/2014 Elsevier Interactive Patient Education [employer]2016 Elsevier Inc.

## 2015-12-17 LAB — GLUCOSE, POCT (MANUAL RESULT ENTRY): POC GLUCOSE: 93 mg/dL (ref 70–99)

## 2016-03-07 ENCOUNTER — Ambulatory Visit (INDEPENDENT_AMBULATORY_CARE_PROVIDER_SITE_OTHER): Payer: Self-pay | Admitting: Internal Medicine

## 2016-03-07 ENCOUNTER — Encounter: Payer: Self-pay | Admitting: Internal Medicine

## 2016-03-07 VITALS — BP 110/78 | HR 74 | Resp 12 | Ht 66.5 in | Wt 248.0 lb

## 2016-03-07 DIAGNOSIS — R059 Cough, unspecified: Secondary | ICD-10-CM

## 2016-03-07 DIAGNOSIS — R05 Cough: Secondary | ICD-10-CM

## 2016-03-07 DIAGNOSIS — E669 Obesity, unspecified: Secondary | ICD-10-CM

## 2016-03-07 DIAGNOSIS — M722 Plantar fascial fibromatosis: Secondary | ICD-10-CM

## 2016-03-07 NOTE — Patient Instructions (Addendum)
Heel cups for both feet  Good cushioned slippers to wear in the house--no barefeet Stretch feet and ankles twice daily when sitting around as we discussed --also before getting out of be  Cool mist humidifier in room May add Allegra 180 mg once daily (Fexofenadine is the generic) for possible allergies if cool mist humidifier does not help much

## 2016-03-07 NOTE — Progress Notes (Signed)
Subjective:    Patient ID: Bonnie Mcneil, female    DOB: 1981/01/07, 36 y.o.   MRN: [id number]  HPI   Here to establish  1.  Cough:  Has had for 2 weeks.  Comes and goes and seems to be congested.  Phlegm is white to clear for most part.  Nose is congested at night sometimes.  Not much in way of sneezing.  No itchy ears or throat.   Wall to wall carpeting in the bedroom.   One cat in house.  Sleeps with daughter.  Only two pillows on bed.  No down comforter or pillows. Does state she has noted her cough is worse when her room has a lot of heat. Does snore and mouth breathes, the latter more so with nasal congestion.  2.  Left ankle hurts--later clarifies her plantar heel hurts:  On and off for years.  This time has bothered her more than a week.  States pain is in her plantar heel.  Hurts most at end of work day. No definite history of injury to ankle.  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  Floor is hard cement.  Wears tennis shoes.  Feels they have good cushion.    3.  Obesity:  Has 4  children ages 529 month to 13 years.  Mother watches them when she is at work.   Weighed 154 lbs before having children. History of elevated sugar when pregnant, but no diagnosis of GDM.  Feels she gained weight a lot with implanon.  Has thyroid disease in family  Desribes fairly healthy diet o sodas Tries to eat lots of vegetables and fruits Used to be very active.  No current outpatient prescriptions on file.   No Known Allergies   Past Medical History:  Diagnosis Date  . Chlamydia 2004  . Cholecystitis 2008  . Intrauterine pregnancy 2004, 2008  . Therapeutic abortion in first trimester 2008   secondary to concern for baby during cholecystecomy   Past Surgical History:  Procedure Laterality Date  . CHOLECYSTECTOMY  2008   Social History   Social History  . Marital status: Divorced    Spouse name: N/A  . Number of children: N/A  . Years of education: N/A    Occupational History  . unemployed    Social History Main Topics  . Smoking status: Never Smoker  . Smokeless tobacco: Never Used  . Alcohol use No  . Drug use: No  . Sexual activity: Not Currently    Birth control/ protection: None   Other Topics Concern  . Not on file   Social History Narrative   [place]       Review of Systems     Objective:   Physical Exam  Obese NAD HEENT;  PERRL, EOMI, TMs pearly gray, nasal mucosa swollen, clear discharge, throat without injection or exudate. Neck:  Supple, No adenopathy, no thyromegaly Chest:  CTA CV:  RRR with normal S1 and S2, No S3, S4 or murmur, radial and DP pulses normal and equal Left foot:  Tender over plantar heel.      Assessment & Plan:  1.  Cough:  Describes throat irritation and dryness:  To obtain cool mist humidifier for room at night.  If no improvement, to try Fexofenadine to clear nasal drainage/congestion and likely posterior pharyngeal drainage.  2.  Plantar fasciitis:  Heel cups, better shoes for support with work.  No bare feet in house--to  obtain cushioned slipper.  No flip flops.   Stretches given. Weight loss,  3.  Obesity:  Long discussion regarding diet and physical activity/lifestyle changes. She will return for fasting labs:  FLP, CmP, TSH

## 2016-03-08 ENCOUNTER — Telehealth: Payer: Self-pay | Admitting: Licensed Clinical Social Worker

## 2016-03-08 NOTE — Telephone Encounter (Signed)
[facility] called Aram BeechamCynthia in order to introduce herself and services available from [employer].

## 2016-03-13 ENCOUNTER — Other Ambulatory Visit (INDEPENDENT_AMBULATORY_CARE_PROVIDER_SITE_OTHER): Payer: Self-pay

## 2016-03-13 DIAGNOSIS — Z13220 Encounter for screening for lipoid disorders: Secondary | ICD-10-CM

## 2016-03-13 DIAGNOSIS — E669 Obesity, unspecified: Secondary | ICD-10-CM

## 2016-03-14 LAB — COMPREHENSIVE METABOLIC PANEL
A/G RATIO: 1.8 (ref 1.2–2.2)
ALBUMIN: 4.4 g/dL (ref 3.5–5.5)
ALK PHOS: 53 IU/L (ref 39–117)
ALT: 35 IU/L — ABNORMAL HIGH (ref 0–32)
AST: 22 IU/L (ref 0–40)
BILIRUBIN TOTAL: 0.5 mg/dL (ref 0.0–1.2)
BUN / CREAT RATIO: 16 (ref 9–23)
BUN: 10 mg/dL (ref 6–20)
CHLORIDE: 102 mmol/L (ref 96–106)
CO2: 21 mmol/L (ref 18–29)
Calcium: 9.1 mg/dL (ref 8.7–10.2)
Creatinine, Ser: 0.61 mg/dL (ref 0.57–1.00)
GFR calc non Af Amer: 119 mL/min/{1.73_m2} (ref >59)
GFR, EST AFRICAN AMERICAN: 138 mL/min/{1.73_m2} (ref >59)
GLUCOSE: 92 mg/dL (ref 65–99)
Globulin, Total: 2.4 g/dL (ref 1.5–4.5)
POTASSIUM: 4.5 mmol/L (ref 3.5–5.2)
Sodium: 140 mmol/L (ref 134–144)
Total Protein: 6.8 g/dL (ref 6.0–8.5)

## 2016-03-14 LAB — LIPID PANEL W/O CHOL/HDL RATIO
CHOLESTEROL TOTAL: 151 mg/dL (ref 100–199)
HDL: 34 mg/dL — ABNORMAL LOW (ref >39)
LDL Calculated: 85 mg/dL (ref 0–99)
Triglycerides: 158 mg/dL — ABNORMAL HIGH (ref 0–149)
VLDL CHOLESTEROL CAL: 32 mg/dL (ref 5–40)

## 2016-03-14 LAB — TSH: TSH: 0.905 u[IU]/mL (ref 0.450–4.500)

## 2016-03-31 NOTE — Progress Notes (Signed)
Patient was given results over the phone and has her hepatic profile lab appointment scheduled for 05/26/16 at 9:00 am

## 2016-04-13 ENCOUNTER — Ambulatory Visit (INDEPENDENT_AMBULATORY_CARE_PROVIDER_SITE_OTHER): Payer: Self-pay | Admitting: Internal Medicine

## 2016-04-13 ENCOUNTER — Encounter: Payer: Self-pay | Admitting: Internal Medicine

## 2016-04-13 VITALS — BP 130/70 | HR 62 | Resp 12 | Ht 66.25 in | Wt 244.0 lb

## 2016-04-13 DIAGNOSIS — E6609 Other obesity due to excess calories: Secondary | ICD-10-CM

## 2016-04-13 DIAGNOSIS — Z30019 Encounter for initial prescription of contraceptives, unspecified: Secondary | ICD-10-CM

## 2016-04-13 DIAGNOSIS — Z6839 Body mass index (BMI) 39.0-39.9, adult: Secondary | ICD-10-CM

## 2016-04-13 DIAGNOSIS — M7711 Lateral epicondylitis, right elbow: Secondary | ICD-10-CM

## 2016-04-13 LAB — POCT URINE PREGNANCY: PREG TEST UR: NEGATIVE

## 2016-04-13 MED ORDER — NAPROXEN 500 MG PO TABS: 500.0000 mg | ORAL_TABLET | Freq: Two times a day (BID) | ORAL | 1 refills | Status: DC

## 2016-04-13 NOTE — Progress Notes (Signed)
   Subjective:    Patient ID: Bonnie Mcneil, female    DOB: October 09, 1980, 36 y.o.   MRN: [id number]  HPI   1.  Right Arm:  Hurts around lateral elbow for past month.  Pain seems to be associated with picking up light boxes above her head with hair products and moving them over to another area.  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  Works M-F 5 hours daily.  Does not always  Perform these same tasks.    2.  Birth control concerns:  States was on unknown BCP after her youngest was born.  Took for almost 1 year, but stopped 3 months ago as felt nauseated.  Took the pill around 6 p.m.  Would go to bed around 10 p.m. Long term boyfriend does not live with her.  He is often out of town with Holiday representativeconstruction.  They have not had intercourse in 4 months, basically 1 month before stopped previous pills.  LMP:  03/31/2016 and normal No personal or family history of DVT or PE Sister with history of elevated liver enzymes with BCPs Nonsmoker.    3.  Morbid Obesity:  Difficult to get more physically active with 2 young kids at home all day.  2 other children in school.  Has a sitter from 12:30 to 6 p.m.  Cannot add any more time with sitter.  No outpatient prescriptions have been marked as taking for the 04/13/16 encounter (Office Visit) with Julieanne MansonElizabeth Cayla Wiegand, MD.   No Known Allergies   Review of Systems     Objective:   Physical Exam Morbidly obese Lungs:  CTA CV:  RRR without murmur or rub, radial pulses normal and equal Abd:  S, NT, No HSM or masses, + BS Right arm:  Tender over right lateral epicondyle of elbow.  Increased pain with pronation against opposing force, less so with supination against opposing force       Assessment & Plan:  1.  Request for change of BCPs:  Called [facility] to find out what she was taking.  Patient had actually filled the Rx yesterday.  Discussed she needs to share that important information in the future.   She is taking ?  Microgestin, but after multiple atttempts to clarify what strength with Pharmacist, could not get this clearly.  Discussed this is likely the best for her to take and would like for her to restart this Sunday.  To take at bedtime and see if tolerates better that way. Follow up in 3 months. She will call in definitively with this info.  2.  Right Lateral Epicondylitis:  Velcro arm splint with air bladder when using arm.  Discussed how to position arm with thumb horizontal or down when picking things up or placing down to prevent recurrent injury. Naproxen 250-500 mg twice daily as needed with food.  3.  Obesity:  Long discussion regarding healthy eating and gradually increasing physical activity.

## 2016-04-13 NOTE — Patient Instructions (Addendum)
Tome un vaso de agua antes de cada comida Tome un minimo de 6 a 8 vasos de agua diarios Coma tres veces al dia Coma una proteina y [name] grasa saludable con comida.  (huevos, pescado, pollo, pavo, y limite carnes rojas Coma 5 porciones diarias de legumbres.  Mezcle los colores Coma 2 porciones diarias de frutas con cascara cuando sea comestible Use platos pequeos Suelte su tenedor o cuchara despues de cada mordida hata que se mastique y se trague Come en la mesa con amigos o familiares por lo menos una vez al dia Apague la televisin y aparatos electrnicos durante la comida  Su objetivo debe ser perder [name] libra por semana  WHEN YOU GET HOME, CALL THE CLINIC WITH THE GENERIC NAME AND STRENGTH (MG OR MCG) OF YOUR BCP

## 2016-04-18 ENCOUNTER — Telehealth: Payer: Self-pay | Admitting: Internal Medicine

## 2016-05-26 ENCOUNTER — Other Ambulatory Visit: Payer: Self-pay

## 2016-06-11 ENCOUNTER — Encounter: Payer: Self-pay | Admitting: Internal Medicine

## 2016-06-11 DIAGNOSIS — E669 Obesity, unspecified: Secondary | ICD-10-CM

## 2016-06-11 HISTORY — DX: Obesity, unspecified: E66.9

## 2016-07-04 ENCOUNTER — Telehealth: Payer: Self-pay | Admitting: Internal Medicine

## 2016-07-04 NOTE — Telephone Encounter (Signed)
Patient would like an Rx for micsogestin 1.5 (birth control pills).  Patient was told to bring in name of Rx so that doctor may refill.  Patient can be reached [phone].  Patient thinks that she needs labs as well.  Would like the nurse to call.

## 2016-07-24 NOTE — Telephone Encounter (Signed)
Will discuss at OV on 08/09/16

## 2016-08-09 ENCOUNTER — Encounter: Payer: Self-pay | Admitting: Internal Medicine

## 2016-08-09 ENCOUNTER — Ambulatory Visit (INDEPENDENT_AMBULATORY_CARE_PROVIDER_SITE_OTHER): Payer: Self-pay | Admitting: Internal Medicine

## 2016-08-09 ENCOUNTER — Telehealth: Payer: Self-pay | Admitting: Internal Medicine

## 2016-08-09 VITALS — BP 124/70 | HR 80 | Resp 12 | Ht 67.0 in | Wt 240.0 lb

## 2016-08-09 DIAGNOSIS — E669 Obesity, unspecified: Secondary | ICD-10-CM

## 2016-08-09 DIAGNOSIS — Z6837 Body mass index (BMI) 37.0-37.9, adult: Secondary | ICD-10-CM

## 2016-08-09 DIAGNOSIS — Z3009 Encounter for other general counseling and advice on contraception: Secondary | ICD-10-CM

## 2016-08-09 MED ORDER — LEVONORGESTREL-ETHINYL ESTRAD 0.15-30 MG-MCG PO TABS
1.0000 | ORAL_TABLET | Freq: Every day | ORAL | 12 refills | Status: DC
Start: 2016-08-09 — End: 2017-07-12

## 2016-08-09 NOTE — Progress Notes (Signed)
CC: Birth control   HPI: The patient is a 36 yo F with no significant past medical history who present for birth control counseling and renewal.    The patient has 4 children, the youngest is 34 year old.  She has always taken combined OCPs in the past (like Yaz for example), without problems.  Since the birth of her last child, she was on a progestin-only pill, but has noticed some bloating, weight gain with this, and has now run out.  She had called clinic for a refill, she thinks, and was told to come in for an appointment.   Her last LMP was 6/5 and she has had no unprotected intercourse since.  Cycles had been regular while on progestin, since stopping, she had a 15 day cycle, feels she is about to have another.  She has had abnormal PAP in the past, last PAP was normal in 2016.  We discussed IUD and implants, but she expresses numerous barriers to each (mostly, negative personal experiences, sister got pregnant with IUD, patient gained weight with implant).   ROS: Review of Systems  Genitourinary: Negative.   All other systems reviewed and are negative.      PMH:  1. Obesity 2. Abnormal PAP many years ago, last PAP normal 2016 3. [id number] 4. Hx chlamydia  Medications: None currently   Prior to Admission medications   Medication Sig Start Date End Date Taking? Authorizing Provider  None     SH: Lives with 4 children.  Nonsmoker.  Works full-time.  No current sexual partners.        OBJECTIVE: BP 124/70 (BP Location: Left Arm, Patient Position: Sitting, Cuff Size: Normal)   Pulse 80   Resp 12   Ht 5\' 7"  (1.702 m)   Wt 240 lb (108.9 kg)   LMP 07/18/2016   BMI 37.59 kg/m  GEN: Well appearing adult female, in no acute distress, pleasant. HEENT: No epistaxis, discharge, or deformity of nose.  Hearing normal.  OP moist without lesions.  COR: RRR, no murmurs, rubs or gallops.  Distal pulses good. RESP: Clear to auscultation bilaterally. NEURO/PSYCH: Mentating well,  sensorium intact and attention normal.       A&P: 1. Contraception counseling: 36 yo F non-smoker, no HTN.  Discussed risks and benefits of OCPs, IUD, implant.  She has had good success with COCPs in the past, will choose Kurvelo to mimic as best we can dosages in Yaz.  LMP >7d ago but no unprotected intercourse since then, will quick start today. Williemae Natter-Kurvelo  -Due for PAP in 1 year -RTC 1 year  2. Obesity: -Counseled re: healthy diet, exercise        Alberteen SamChristopher P Maritza Hosterman 08/09/2016, 10:50 AM

## 2016-08-09 NOTE — Telephone Encounter (Signed)
Patient inquiring about dental referral.  Bonnie Bibleat did not see a referral in the system or any documentation in the notes.  Please advise.

## 2016-08-09 NOTE — Patient Instructions (Signed)
Oral Contraception Information Oral contraceptive pills (OCPs) are medicines taken to prevent pregnancy. OCPs work by preventing the ovaries from releasing eggs. The hormones in OCPs also cause the cervical mucus to thicken, preventing the sperm from entering the uterus. The hormones also cause the uterine lining to become thin, not allowing a fertilized egg to attach to the inside of the uterus. OCPs are highly effective when taken exactly as prescribed. However, OCPs do not prevent sexually transmitted diseases (STDs). Safe sex practices, such as using condoms along with the pill, can help prevent STDs. Before taking the pill, you may have a physical exam and Pap test. Your health care provider may order blood tests. The health care provider will make sure you are a good candidate for oral contraception. Discuss with your health care provider the possible side effects of the OCP you may be prescribed. When starting an OCP, it can take 2 to 3 months for the body to adjust to the changes in hormone levels in your body. Types of oral contraception  The combination pill-This pill contains estrogen and progestin (synthetic progesterone) hormones. The combination pill comes in 21-day, 28-day, or 91-day packs. Some types of combination pills are meant to be taken continuously (365-day pills). With 21-day packs, you do not take pills for 7 days after the last pill. With 28-day packs, the pill is taken every day. The last 7 pills are without hormones. Certain types of pills have more than 21 hormone-containing pills. With 91-day packs, the first 84 pills contain both hormones, and the last 7 pills contain no hormones or contain estrogen only.  The minipill-This pill contains the progesterone hormone only. The pill is taken every day continuously. It is very important to take the pill at the same time each day. The minipill comes in packs of 28 pills. All 28 pills contain the hormone. Advantages of oral  contraceptive pills  Decreases premenstrual symptoms.  Treats menstrual period cramps.  Regulates the menstrual cycle.  Decreases a heavy menstrual flow.  May treatacne, depending on the type of pill.  Treats abnormal uterine bleeding.  Treats polycystic ovarian syndrome.  Treats endometriosis.  Can be used as emergency contraception. Things that can make oral contraceptive pills less effective OCPs can be less effective if:  You forget to take the pill at the same time every day.  You have a stomach or intestinal disease that lessens the absorption of the pill.  You take OCPs with other medicines that make OCPs less effective, such as antibiotics, certain HIV medicines, and some seizure medicines.  You take expired OCPs.  You forget to restart the pill on day 7, when using the packs of 21 pills.  Risks associated with oral contraceptive pills Oral contraceptive pills can sometimes cause side effects, such as:  Headache.  Nausea.  Breast tenderness.  Irregular bleeding or spotting.  Combination pills are also associated with a small increased risk of:  Blood clots.  Heart attack.  Stroke.  This information is not intended to replace advice given to you by your health care provider. Make sure you discuss any questions you have with your health care provider. Document Released: 04/22/2002 Document Revised: 07/08/2015 Document Reviewed: 07/21/2012 Elsevier Interactive Patient Education  2018 Elsevier Inc.  

## 2016-08-11 NOTE — Telephone Encounter (Signed)
To Dr. Delrae AlfredMulberry for direction. Checked chart did not see any referral for dental

## 2016-09-19 NOTE — Telephone Encounter (Signed)
I don't see that we spoke about this in previous notes.  Is she having a particular problem?

## 2016-09-25 NOTE — Telephone Encounter (Signed)
Britt Boozericky please call and get further info

## 2016-12-04 ENCOUNTER — Ambulatory Visit: Payer: Self-pay | Admitting: Internal Medicine

## 2016-12-05 ENCOUNTER — Ambulatory Visit (INDEPENDENT_AMBULATORY_CARE_PROVIDER_SITE_OTHER): Payer: Self-pay | Admitting: Internal Medicine

## 2016-12-05 ENCOUNTER — Encounter: Payer: Self-pay | Admitting: Internal Medicine

## 2016-12-05 VITALS — BP 102/70 | HR 60 | Resp 12 | Ht 67.0 in | Wt 234.0 lb

## 2016-12-05 DIAGNOSIS — J3089 Other allergic rhinitis: Secondary | ICD-10-CM | POA: Insufficient documentation

## 2016-12-05 DIAGNOSIS — N921 Excessive and frequent menstruation with irregular cycle: Secondary | ICD-10-CM

## 2016-12-05 DIAGNOSIS — E669 Obesity, unspecified: Secondary | ICD-10-CM

## 2016-12-05 DIAGNOSIS — Z6837 Body mass index (BMI) 37.0-37.9, adult: Secondary | ICD-10-CM

## 2016-12-05 DIAGNOSIS — Z23 Encounter for immunization: Secondary | ICD-10-CM

## 2016-12-05 MED ORDER — CETIRIZINE HCL 10 MG PO TABS: 10.0000 mg | ORAL_TABLET | Freq: Every day | ORAL | 11 refills | Status: DC

## 2016-12-05 MED ORDER — INFLUENZA VAC SPLIT QUAD 0.5 ML IM SUSY: 0.5000 mL | PREFILLED_SYRINGE | Freq: Once | INTRAMUSCULAR | 0 refills | Status: AC

## 2016-12-05 MED ORDER — NORGESTIMATE-ETH ESTRADIOL 0.25-35 MG-MCG PO TABS: 1.0000 | ORAL_TABLET | Freq: Every day | ORAL | 7 refills | Status: DC

## 2016-12-05 NOTE — Patient Instructions (Signed)
Tome un vaso de agua antes de cada comida Tome un minimo de 6 a 8 vasos de agua diarios Coma tres veces al dia Coma una proteina y [name] grasa saludable con comida.  (huevos, pescado, pollo, pavo, y limite carnes rojas Coma 5 porciones diarias de legumbres.  Mezcle los colores Coma 2 porciones diarias de frutas con cascara cuando sea comestible Use platos pequeos Suelte su tenedor o cuchara despues de cada mordida hata que se mastique y se trague Come en la mesa con amigos o familiares por lo menos una vez al dia Apague la televisin y aparatos electrnicos durante la comida  Su objetivo debe ser perder [name] libra por semana  Saline nasal spray--spray each nostril or snort from clean palm of hand every 6 hours as needed for congestion. Call if no better in next week and [facility]

## 2016-12-05 NOTE — Progress Notes (Signed)
   Subjective:    Patient ID: Bonnie Mcneil, female    DOB: Jan 29, 1981, 36 y.o.   MRN: [id number]  HPI   1.  At least 2 months ago, started with uterine flow outside of when she should be having flow.  Taking pills at 7 p.m. Every day.  Has a regular flow generally starting with the last day of the hormone containing pill to the second day of non hormone containing pill.  Generally lasts 5 days.   May have spotting or low flow for 1-2 days mid week 1st week of hormone pills.   Second week of packet may be end of the week into the weekend. Third week, can have spotting any of the days--generally only 1 day.   Can get cramping with the pill.   No vaginal discharge.  No tobacco use.   No family history of clotting disorder   2.  Mucous and irritation in throat, nasal congestion for 2 weeks.  No fever.  Unable to blow anything out through nose and in morning a lot of posterior pharyngeal drainage, though congestion worse at night. Ears itch, little to no sneezing.  No eye complaints  3.  Obesity:  Has lost weight.  Still needs to work on diet, though has made good changes.  Walking 4 days weekly with 34 yo daughter--60 minutes.  Current Meds  Medication Sig  . levonorgestrel-ethinyl estradiol (KURVELO) 0.15-30 MG-MCG tablet Take 1 tablet by mouth daily.   No Known Allergies   Review of Systems     Objective:   Physical Exam NAD HEENT:  PERRL, EOMI, conjunctivae without injection, TMs pearly gray.  Cobbling of posterior pharynx.  Nasal mucosa red and boggy.  Clear discharge Neck: Supple, No adenopathy Chest:  CTA CV:  RRR without murmur or rub, radial and DP pulses normal and equal       Assessment & Plan:  1.  Breakthrough bleeding on BCPs:  occurring at beginning and end of hormones, though beginning is more prominent.  Increase both estrogen and progesterone by moving to Sprintec 28 day.  Discussed may continue with bleeding issues with switch for first couple of  months  2.  Allergies vs. URI vs sinusitis:  Zyrtec 10 mg daily for next week with saline nasal spray or snort.  If no improvement, to call and will send in Rx for Zpack.  3.  Obesity:  Making inroads.  To continue to improve diet and increase time physically active each day.

## 2017-02-13 NOTE — L&D Delivery Note (Signed)
Delivery Note At 3:39 AM a viable female was delivered via Vaginal, Spontaneous (Presentation: cephalic; ROA).  Shoulder and body delivered in usual fashion. Infant with spontaneous cry, placed on mother's abdomen, dried and bulb suctioned. APGAR: 9, 9; weight: pending (appears AGA).    Placenta status: intact, 3-vessel cord.  Cord:  with the following complications: none. Cord clamped x 2 after 1-minute delay, and cut by delivery provider. Cord blood drawn. Placenta delivered spontaneously with gentle cord traction. Fundus firm with massage and Pitocin.   Anesthesia: epidural  Episiotomy: None Lacerations:  none Suture Repair: NA Est. Blood Loss (mL):   Mom to postpartum.  Baby to Couplet care / Skin to Skin.  Bonnie Mcneil 12/20/2017, 4:43 AM

## 2017-04-09 ENCOUNTER — Ambulatory Visit: Payer: Self-pay | Admitting: Internal Medicine

## 2017-04-24 NOTE — Telephone Encounter (Signed)
Error

## 2017-07-12 ENCOUNTER — Ambulatory Visit: Payer: Self-pay | Admitting: Family Medicine

## 2017-07-12 ENCOUNTER — Ambulatory Visit: Payer: Self-pay | Admitting: Urgent Care

## 2017-07-12 ENCOUNTER — Encounter: Payer: Self-pay | Admitting: Urgent Care

## 2017-07-12 VITALS — BP 104/62 | HR 83 | Temp 97.3°F | Ht 67.0 in | Wt 254.6 lb

## 2017-07-12 DIAGNOSIS — Z3492 Encounter for supervision of normal pregnancy, unspecified, second trimester: Secondary | ICD-10-CM

## 2017-07-12 DIAGNOSIS — N926 Irregular menstruation, unspecified: Secondary | ICD-10-CM

## 2017-07-12 LAB — POCT URINE PREGNANCY: Preg Test, Ur: POSITIVE — AB

## 2017-07-12 NOTE — Patient Instructions (Signed)
Segundo trimestre de embarazo  Second Trimester of Pregnancy  El segundo trimestre va desde la semana 14 hasta la 27, desde el cuarto hasta el sexto mes, y suele ser el momento en el que mejor se siente. Su organismo se ha adaptado a estar embarazada, y comienza a sentirse físicamente mejor. En general, las náuseas matutinas han disminuido o han desaparecido completamente, puede tener más energía y un aumento de apetito. El segundo trimestre es también la época en la que el feto se desarrolla rápidamente. Hacia el final del sexto mes, el feto mide aproximadamente 9 pulgadas (23 cm) y pesa alrededor de 1½ libras (700 g). Es probable que sienta que el bebé se mueve (da pataditas) entre las 16 y 20 semanas del embarazo.  Cambios en el cuerpo durante el segundo trimestre  Su cuerpo continua experimentando numerosos cambios durante su segundo trimestre. Estos cambios varían de una mujer a otra.  · Seguirá aumentando de peso. Notará que la parte baja del abdomen sobresale.  · Podrán aparecer las primeras estrías en las caderas, el abdomen y las mamas.  · Es posible que tenga dolores de cabeza que pueden aliviarse con ciertos medicamentos. Los medicamentos que tome deben estar aprobados por el médico.  · Tal vez tenga necesidad de orinar con más frecuencia porque el feto está ejerciendo presión sobre la vejiga.  · Debido al embarazo podrá sentir acidez estomacal con frecuencia.  · Puede estar estreñida, ya que ciertas hormonas enlentecen los movimientos de los músculos que empujan los desechos a través de los intestinos.  · Pueden aparecer hemorroides o abultarse e hincharse las venas (venas varicosas).  · Puede sentir dolor en la espalda. Esto se debe a:  ? Aumento de peso.  ? Las hormonas del embarazo relajan las articulaciones en la pelvis.  ? Un cambio en el peso y los músculos que ayudan a mantener su equilibrio.  · Sus pechos seguirán creciendo y se pondrán cada vez más sensibles.   · Las encías pueden sangrar y estar sensibles al cepillado y al hilo dental.  · Pueden aparecer zonas oscuras o manchas (cloasma, máscara del embarazo) en el rostro. Esto probablemente se atenuará después del nacimiento del bebé.  · Es posible que se forme una línea oscura desde el ombligo hasta la zona del pubis (linea nigra). Esto probablemente se atenuará después del nacimiento del bebé.  · Tal vez haya cambios en el cabello. Esto cambios pueden incluir su engrosamiento, crecimiento rápido y cambios en la textura. Además, a algunas mujeres se les cae el cabello durante o después del embarazo, o tienen el cabello seco o fino. Lo más probable es que el cabello se le normalice después del nacimiento del bebé.    Qué debe esperar en las visitas prenatales  Durante una visita prenatal de rutina:  · La pesarán para asegurarse de que usted y el feto están creciendo normalmente.  · Le tomarán la presión arterial.  · Le medirán el abdomen para controlar el desarrollo del bebé.  · Se escucharán los latidos cardíacos fetales.  · Se evaluarán los resultados de los estudios solicitados en visitas anteriores.    El médico puede preguntarle lo siguiente:  · Cómo se siente.  · Si siente los movimientos del bebé.  · Si ha tenido síntomas anormales, como pérdida de líquido, sangrado, dolores de cabeza intensos o cólicos abdominales.  · Si está consumiendo algún producto que contenga tabaco, como cigarrillos, tabaco de mascar y cigarrillos electrónicos.  · Si tiene alguna pregunta.    Otros   estudios que podrán realizarse durante el segundo trimestre incluyen lo siguiente:  · Análisis de sangre para detectar lo siguiente:  ? Concentraciones de hierro bajas (anemia).  ? Nivel alto de azúcar en la sangre que afecta a las mujeres embarazadas (diabetes gestacional) entre las semanas 24 y 28.  ? Anticuerpos Rh. Esto es para detectar una proteína en los glóbulos rojos (factor Rh).   · Análisis de orina para detectar infecciones, diabetes o proteínas en la orina.  · Una ecografía para confirmar que el bebé crece y se desarrolla correctamente.  · Una amniocentesis para diagnosticar posibles problemas genéticos.  · Estudios del feto para descartar espina bífida y síndrome de Down.  · Prueba del VIH (virus de inmunodeficiencia humana). Los exámenes prenatales de rutina incluyen la prueba de detección del VIH, a menos que decida no realizársela.    Siga estas indicaciones en su casa:  Medicamentos  · Siga las indicaciones del médico en relación con el uso de medicamentos. Durante el embarazo, hay medicamentos que pueden tomarse y otros que no.  · Tome vitaminas prenatales que contengan por lo menos 600 microgramos (?g) de ácido fólico.  · Si está estreñida, tome un laxante suave, si el médico lo autoriza.  Qué debe comer y beber  · Lleve una dieta equilibrada que incluya gran cantidad de frutas y verduras frescas, cereales integrales, buenas fuentes de proteínas como carnes magras, huevos o tofu, y lácteos descremados. El médico la ayudará a determinar la cantidad de peso que puede aumentar.  · No coma carne cruda ni quesos sin cocinar. Estos elementos contienen gérmenes que pueden causar defectos congénitos en el bebé.  · Si no consume muchos alimentos con calcio, hable con su médico sobre si debería tomar un suplemento diario de calcio.  · Limite el consumo de alimentos con alto contenido de grasas y azúcares procesados, como alimentos fritos o dulces.  · Para evitar el estreñimiento:  ? Bebe suficiente líquido para mantener la orina clara o de color amarillo pálido.  ? Consuma alimentos ricos en fibra, como frutas y verduras frescas, cereales integrales y frijoles.  Actividad  · Haga ejercicio solamente como se lo haya indicado el médico. La mayoría de las mujeres pueden continuar su rutina de ejercicios durante el embarazo. Intente realizar como mínimo 30 minutos de actividad física por  lo menos 5 días a la semana. Deje de hacer ejercicio si experimenta contracciones uterinas.  · No levante objetos pesados, use zapatos de tacones bajos y mantenga una buena postura.  · Puede seguir manteniendo relaciones sexuales, a menos que el médico le indique lo contrario.  Alivio del dolor y del malestar  · Use un sostén que le brinde buen soporte para prevenir las molestias causadas por la sensibilidad en los pechos.  · Dese baños de asiento con agua tibia para aliviar el dolor o las molestias causadas por las hemorroides. Use una crema para las hemorroides si el médico la autoriza.  · Descanse con las piernas elevadas si tiene calambres o dolor de cintura.  · Si tiene venas varicosas, use medias de descanso. Eleve los pies durante 15 minutos, 3 o 4 veces por día. Limite el consumo de sal en su dieta.  Cuidados prenatales  · Escriba sus preguntas. Llévelas cuando concurra a las visitas prenatales.  · Concurra a todas las visitas prenatales tal como se lo haya indicado el médico. Esto es importante.  Seguridad  · Use el cinturón de seguridad en todo momento mientras conduce.  · Haga una lista de los   números de teléfono de emergencia, que incluya los números de teléfono de familiares, amigos, el hospital y los departamentos de policía y bomberos.  Instrucciones generales  · Pídale al médico que la derive a clases de educación prenatal en su localidad. Debe comenzar a tomar las clases antes de que empiece el mes 6 de embarazo.  · Pida ayuda si tiene necesidades nutricionales o de asesoramiento durante el embarazo. El médico puede aconsejarla o derivarla a especialistas para que la ayuden con diferentes necesidades.  · No se dé baños de inmersión en agua caliente, baños turcos ni saunas.  · No se haga duchas vaginales ni use tampones o toallas higiénicas perfumadas.  · No mantenga las piernas cruzadas durante mucho tiempo.  · Evite el contacto con las bandejas sanitarias de los gatos y la tierra  que estos animales usan. Estos elementos contienen bacterias que pueden causar defectos congénitos al bebé y la posible pérdida del feto debido a un aborto espontáneo o muerte fetal.  · Evite fumar, consumir hierbas, beber alcohol y tomar fármacos que no le hayan recetado. Las sustancias químicas que estos productos contienen pueden afectar la formación y el desarrollo del bebé.  · No consuma ningún producto que contenga nicotina o tabaco, como cigarrillos y cigarrillos electrónicos. Si necesita ayuda para dejar de fumar, consulte al médico.  · Visite a su dentista si aún no lo ha hecho durante el embarazo. Use un cepillo de dientes blando para higienizarse los dientes y pásese el hilo dental con suavidad.  Comuníquese con un médico si:  · Tiene mareos.  · Siente cólicos leves, presión en la pelvis o dolor persistente en el abdomen.  · Tiene náuseas, vómitos o diarrea persistentes.  · Observa una secreción vaginal con mal olor.  · Siente dolor al orinar.  Solicite ayuda de inmediato si:  · Tiene fiebre.  · Tiene una pérdida de líquido por la vagina.  · Tiene sangrado o pequeñas pérdidas vaginales.  · Siente dolor intenso o cólicos en el abdomen.  · Sube de peso o baja de peso rápidamente.  · Tiene dificultad para respirar y siente dolor de pecho.  · Súbitamente se le hinchan mucho el rostro, las manos, los tobillos, los pies o las piernas.  · No ha sentido los movimientos del bebé durante una hora.  · Siente un dolor de cabeza intenso que no se alivia al tomar medicamentos.  · Nota cambios en la visión.  Resumen  · El segundo trimestre va desde la semana 14 hasta la 27, desde el cuarto hasta el sexto mes. Es también una época en la que el feto se desarrolla rápidamente.  · Su organismo atraviesa por muchos cambios durante el embarazo. Estos cambios varían de una mujer a otra.  · Evite fumar, consumir hierbas, beber alcohol y tomar fármacos que no le hayan recetado. Estas sustancias químicas afectan la formación y el  desarrollo de su bebé.  · No consuma ningún producto que contenga tabaco, lo que incluye cigarrillos, tabaco de mascar y cigarrillos electrónicos. Si necesita ayuda para dejar de fumar, consulte al médico.  · Comuníquese con su médico si tiene preguntas sobre esto. Concurra a todas las visitas prenatales tal como se lo haya indicado el médico. Esto es importante.  Esta información no tiene como fin reemplazar el consejo del médico. Asegúrese de hacerle al médico cualquier pregunta que tenga.  Document Released: 11/09/2004 Document Revised: 06/12/2016 Document Reviewed: 06/12/2016  Elsevier Interactive Patient Education © 2018 Elsevier Inc.

## 2017-07-12 NOTE — Progress Notes (Signed)
   MRN: [id number] DOB: Aug 28, 1980  Subjective:   Bonnie Mcneil is a 37 y.o. female presenting for amenorrhea since January. LMP was 03/06/2017.  Patient needs confirmation testing so that she can obtain OB care.  She states that she has started a prenatal vitamin.  Denies that she is having any issues including fever, nausea, vomiting, belly pain, vaginal bleeding.  Denies smoking cigarettes.  Bonnie Mcneil is not currently taking any medications.  Also has No Known Allergies.  Bonnie Mcneil  has a past medical history of Chlamydia (2004), Cholecystitis (2008), Intrauterine pregnancy (2004, 2008), Obesity (06/11/2016), and Therapeutic abortion in first trimester (2008). Also  has a past surgical history that includes Cholecystectomy (2008).  Objective:   Vitals: BP 104/62 (BP Location: Left Arm, Patient Position: Sitting, Cuff Size: Normal)   Pulse 83   Temp (!) 97.3 F (36.3 C) (Oral)   Ht  (1.702 m)   Wt 254 lb 9.6 oz (115.5 kg)   SpO2 99%   BMI 39.88 kg/m   Physical Exam  Constitutional: She is oriented to person, place, and time. She appears well-developed and well-nourished.  Cardiovascular: Normal rate.  Pulmonary/Chest: Effort normal.  Neurological: She is alert and oriented to person, place, and time.   Results for orders placed or performed in visit on 07/12/17 (from the past 24 hour(s))  POCT urine pregnancy     Status: Abnormal   Collection Time: 07/12/17  3:02 PM  Result Value Ref Range   Preg Test, Ur Positive (A) Negative   Assessment and Plan :   Second trimester pregnancy  Missed period - Plan: POCT urine pregnancy  Pregnancy confirmed today, letter of confirmation provided.  Offered patient help if she starts to have nausea or vomiting.  Otherwise she is to continue her prenatal vitamin.  Wallis Bamberg, PA-C Primary Care at [facility] [phone] 07/12/2017  3:32 PM

## 2017-08-06 ENCOUNTER — Other Ambulatory Visit: Payer: Self-pay

## 2017-08-06 DIAGNOSIS — Z3481 Encounter for supervision of other normal pregnancy, first trimester: Secondary | ICD-10-CM

## 2017-08-07 LAB — OBSTETRIC PANEL, INCLUDING HIV
ANTIBODY SCREEN: NEGATIVE
BASOS: 0 %
Basophils Absolute: 0 10*3/uL (ref 0.0–0.2)
EOS (ABSOLUTE): 0.2 10*3/uL (ref 0.0–0.4)
EOS: 2 %
HEMATOCRIT: 36.5 % (ref 34.0–46.6)
HEMOGLOBIN: 12.5 g/dL (ref 11.1–15.9)
HIV SCREEN 4TH GENERATION: NONREACTIVE
Hepatitis B Surface Ag: NEGATIVE
Immature Grans (Abs): 0.1 10*3/uL (ref 0.0–0.1)
Immature Granulocytes: 1 %
LYMPHS ABS: 1.7 10*3/uL (ref 0.7–3.1)
Lymphs: 15 %
MCH: 29.5 pg (ref 26.6–33.0)
MCHC: 34.2 g/dL (ref 31.5–35.7)
MCV: 86 fL (ref 79–97)
MONOS ABS: 0.8 10*3/uL (ref 0.1–0.9)
Monocytes: 8 %
NEUTROS ABS: 8.4 10*3/uL — AB (ref 1.4–7.0)
Neutrophils: 74 %
Platelets: 243 10*3/uL (ref 150–450)
RBC: 4.24 x10E6/uL (ref 3.77–5.28)
RDW: 14.6 % (ref 12.3–15.4)
RH TYPE: POSITIVE
RPR Ser Ql: NONREACTIVE
Rubella Antibodies, IGG: 15.5 index (ref >0.99)
WBC: 11.2 10*3/uL — AB (ref 3.4–10.8)

## 2017-08-07 LAB — SICKLE CELL SCREEN: Sickle Cell Screen: NEGATIVE

## 2017-08-08 LAB — URINE CULTURE, OB REFLEX

## 2017-08-08 LAB — CULTURE, OB URINE

## 2017-08-13 ENCOUNTER — Other Ambulatory Visit (HOSPITAL_COMMUNITY): Admission: RE | Admit: 2017-08-13 | Payer: Self-pay | Source: Ambulatory Visit

## 2017-08-13 ENCOUNTER — Ambulatory Visit (INDEPENDENT_AMBULATORY_CARE_PROVIDER_SITE_OTHER): Payer: Self-pay | Admitting: Family Medicine

## 2017-08-13 ENCOUNTER — Encounter: Payer: Self-pay | Admitting: Family Medicine

## 2017-08-13 ENCOUNTER — Other Ambulatory Visit: Payer: Self-pay

## 2017-08-13 VITALS — BP 112/72 | HR 103 | Temp 98.4°F | Wt 256.2 lb

## 2017-08-13 DIAGNOSIS — Z3492 Encounter for supervision of normal pregnancy, unspecified, second trimester: Secondary | ICD-10-CM

## 2017-08-13 NOTE — Progress Notes (Signed)
     Subjective: Chief Complaint  Patient presents with  . Initial Prenatal Visit   HPI: Bonnie Mcneil is a 37 y.o. presenting to clinic today to discuss the following:  New OB Visit   OB History    Gravida  6   Para  4   Term  4   Preterm  0   AB  1   Living  4     SAB  0   TAB  1   Ectopic  0   Multiple  0   Live Births  4          Patient presents as a new OB patient. This is her 6th pregnancy overall. She endorses no complications, denies any loss of fluid, vaginal bleeding, contraction, or abdominal pain now or prior to this visit since this pregnancy began. She is late to care presenting at a calculated 22 weeks. She will also be >35 at time of delivery but declines fetal testing.   ROS noted in HPI.   Past Medical, Surgical, Social, and Family History Reviewed & Updated per EMR.   Pertinent Historical Findings include:   Social History   Tobacco Use  Smoking Status Never Smoker  Smokeless Tobacco Never Used   Objective: BP 112/72   Pulse (!) 103   Temp 98.4 F (36.9 C)   Wt 256 lb 3.2 oz (116.2 kg)   LMP 03/07/2017 (Approximate)   BMI 40.13 kg/m  Vitals and nursing notes reviewed  Physical Exam Gen: Alert and Oriented x 3, NAD HEENT: Normocephalic, atraumatic, PERRLA, EOMI non-swollen, non-erythematous turbinates, non-erythematous pharyngeal mucosa, no exudates Neck: trachea midline, no thyroidmegaly, no LAD CV: RRR, no murmurs, normal S1, S2 split, +2 pulses dorsalis pedis bilaterally Resp: CTAB, no wheezing, rales, or rhonchi, comfortable work of breathing Abd: non-distended, non-tender, soft, +bs in all four quadrants, gravid uterus measuring approximately 23cm, fetal heart rate detected at 131bpm GU: Cervical os is closed with clear thin discharge, no abnormalities seen on exam MSK: FROM in all four extremities Ext: no clubbing, cyanosis, or trace edema LE bilaterally Skin: warm, dry, intact, no rashes Psych: appropriate  behavior, mood   Assessment/Plan:  Second trimester pregnancy Performed GC/CC swab today. Uterus size is consistent with her reported LMP.   Patient has obtained her 2nd trimester OB panel. Discuxxed benefits of breast feeding and future pregnancy planning (ie contraception) with patient. Also discussed work/physical activity limitations, newborn safety, maternal safety with patient today.  She [facility] within two weeks given late presentation. Ordered OB ultrasound today.   PATIENT EDUCATION PROVIDED: See AVS    Diagnosis and plan along with any newly prescribed medication(s) were discussed in detail with this patient today. The patient verbalized understanding and agreed with the plan. Patient advised if symptoms worsen return to clinic or ER.   Health Maintainance:   No orders of the defined types were placed in this encounter.   No orders of the defined types were placed in this encounter.    Jules Schickim Miko Markwood, DO 08/13/2017, 2:32 PM PGY-2, [facility]

## 2017-08-13 NOTE — Patient Instructions (Signed)
It was great to meet you today! Thank you for letting me participate in your care!  Today, we discussed your recent pregnancy. We will get you an ultrasound as soon as possible and call you with the appointment. Please come in next week to be seen by our OB Clinic to ensure everything is progressing well with your pregnancy.  Be well, Bonnie Schickim Braxton Vantrease, DO PGY-1, [facility]

## 2017-08-14 LAB — CERVICOVAGINAL ANCILLARY ONLY
Chlamydia: NEGATIVE
Neisseria Gonorrhea: NEGATIVE

## 2017-08-15 ENCOUNTER — Other Ambulatory Visit: Payer: Self-pay | Admitting: Family Medicine

## 2017-08-15 ENCOUNTER — Ambulatory Visit (HOSPITAL_COMMUNITY)
Admission: RE | Admit: 2017-08-15 | Discharge: 2017-08-15 | Disposition: A | Discharge status: Home / Self Care | Payer: Self-pay | Source: Ambulatory Visit | Attending: Family Medicine | Admitting: Family Medicine

## 2017-08-15 ENCOUNTER — Encounter: Payer: Self-pay | Admitting: Family Medicine

## 2017-08-15 DIAGNOSIS — Z3A23 23 weeks gestation of pregnancy: Secondary | ICD-10-CM

## 2017-08-15 DIAGNOSIS — Z363 Encounter for antenatal screening for malformations: Secondary | ICD-10-CM

## 2017-08-15 DIAGNOSIS — O99212 Obesity complicating pregnancy, second trimester: Secondary | ICD-10-CM

## 2017-08-15 DIAGNOSIS — O09522 Supervision of elderly multigravida, second trimester: Secondary | ICD-10-CM

## 2017-08-15 DIAGNOSIS — Z3492 Encounter for supervision of normal pregnancy, unspecified, second trimester: Secondary | ICD-10-CM

## 2017-08-15 NOTE — Progress Notes (Signed)
Mailing normal results to patient

## 2017-08-20 DIAGNOSIS — Z3492 Encounter for supervision of normal pregnancy, unspecified, second trimester: Secondary | ICD-10-CM | POA: Insufficient documentation

## 2017-08-20 NOTE — Assessment & Plan Note (Addendum)
Performed GC/CC swab today. Uterus size is consistent with her reported LMP.   Patient has obtained her 2nd trimester OB panel. Discuxxed benefits of breast feeding and future pregnancy planning (ie contraception) with patient. Also discussed work/physical activity limitations, newborn safety, maternal safety with patient today.  She [facility] two weeks given late presentation. Ordered OB ultrasound today.

## 2017-08-23 ENCOUNTER — Ambulatory Visit (INDEPENDENT_AMBULATORY_CARE_PROVIDER_SITE_OTHER): Payer: Self-pay | Admitting: Family Medicine

## 2017-08-23 VITALS — BP 110/68 | HR 74 | Temp 98.3°F | Wt 260.0 lb

## 2017-08-23 DIAGNOSIS — Z3492 Encounter for supervision of normal pregnancy, unspecified, second trimester: Secondary | ICD-10-CM

## 2017-08-23 NOTE — Progress Notes (Signed)
Bonnie Mcneil is a 37 y.o. [id number] at 9843w1d for routine follow up.  She reports: Occasional headache that self resolve. At times sees spots in her eyes whenever she gets up from a sitting position quickly. However, this does not occur often. No belly pain, no vaginal discharge  See flow sheet for details.  FH: Difficult to assess due to habitus. It was around 27.5 cm. We will monitor at next visit.  Birth control: Tubal Ligation vs OCP vs IUD 1 hr glucose test in two weeks. Appointment scheduled. She is aware of her lab appointment date and time.  A/P: Pregnancy at 743w1d.  Doing well.   Pregnancy issues include: AMA. Obesity in pregnancy. Anatomy scan reviewed, problems are noted.  Preterm labor precautions reviewed. Follow up 4 weeks.

## 2017-08-23 NOTE — Patient Instructions (Signed)
It was nice seeing you today. Please see Dr. Sharen Counter in 4 weeks.  Cuidados prenatales (Prenatal Care) QU SON LOS CUIDADOS PRENATALES? Los cuidados prenatales son Bonnie Mcneil se brindan a una embarazada antes del [place]. Los cuidados prenatales garantizan que la embarazada y el feto estn tan sanos como sea posible durante todo el [place]. Pueden brindar [employer] tipo de cuidados [place], un mdico de atencin primaria o un especialista en parto y Psychiatrist (Bonnie Mcneil). Los cuidados prenatales incluyen exmenes fsicos, estudios, tratamientos e informacin sobre nutricin, estilo de vida y servicios de apoyo social. POR QU SON TAN IMPORTANTES LOS CUIDADOS PRENATALES? Los cuidados prenatales recibidos desde un inicio y de forma peridica aumentan la probabilidad de que usted y el beb permanezcan sanos durante todo el [place]. Este tipo de cuidados tambin reduce el riesgo de que el beb nazca mucho antes de la fecha probable de parto (prematuro) o de que sea ms pequeo de lo previsto (pequeo para la edad gestacional). Durante las visitas prenatales, se Engineer, technical sales clase de enfermedad preexistente que usted pueda tener y que represente un riesgo durante el Psychiatrist. Tambin la monitorearn con regularidad para Insurance risk surveyor afeccin que pueda surgir Academic librarian, a fin de tratarla con rapidez y eficacia. QU SUCEDE DURANTE LAS VISITAS PRENATALES? Las visitas prenatales pueden incluir lo siguiente: [place] Informe al mdico cualquier signo o sntoma nuevo que haya tenido desde la ltima visita. Estos pueden incluir los siguientes:  Nuseas o vmitos.  Aumento o disminucin del nivel de [place].  Dificultad para dormir.  Dolor en la espalda o las piernas.  Cambios en [employer].  Ganas frecuentes de Geographical information systems officer.  Falta de aire al realizar actividad fsica.  Cambios en la piel, por ejemplo, una erupcin cutnea o picazn.  Sangrado o flujo vaginal.  Sensacin de  excitacin o nerviosismo.  Cambios en los movimientos del feto. Es conveniente que escriba cualquier pregunta o tema del que quiera hablar con el mdico, para llevarlo anotado a la cita. Exmenes Durante la primera visita prenatal, es probable que le hagan un examen fsico completo. El [employer] revisar con frecuencia la vagina, el cuello del tero y la posicin del tero, adems de examinarle el corazn, los pulmones y otras partes del cuerpo. A medida que el embarazo avance, el mdico medir el tamao del tero y IT consultant posicin del feto dentro del tero. Tambin puede examinarla para [employer] primeros signos del [place] de [place]. Las visitas prenatales tambin pueden incluir el control de la presin arterial y, despus de 10 a 12semanas de embarazo, aproximadamente, el control de los latidos del feto. Estudios Los estudios habituales suelen incluir lo siguiente:  Anlisis de [place]. Este anlisis examina la presencia de glucosa, protenas o signos de infeccin en la orina.  Recuento sanguneo. Este anlisis verifica el nivel de glbulos rojos y blancos en el organismo.  Pruebas de enfermedades de transmisin sexual (ETS). Las pruebas de Airline pilot de ETS al comienzo del embarazo son Neomia Dear prctica de rutina, y en muchos estados es obligacin practicarlas.  Anlisis de anticuerpos. La examinarn para ver si es inmune a determinadas enfermedades, como la [place], que puede afectar al feto en desarrollo.  Deteccin de glucosa. Entre la semana 24y 28de embarazo, le analizarn el nivel de glucemia para detectar signos de diabetes gestacional. Pueden recomendarle un anlisis de seguimiento.  Estreptococos del grupoB. Es comn encontrar estas bacterias dentro de la vagina. Este Abbott Laboratories indicar al mdico si necesita darle un antibitico para reducir la  cantidad de este tipo de bacterias en el cuerpo antes del trabajo de parto y [place].  Ecografas. Alrededor de la semana 18a 20de  embarazo, muchas embarazadas se hacen ecografas para evaluar la salud del feto y Engineer, manufacturing cualquier anomala en el desarrollo.  Prueba del VIH (virus de inmunodeficiencia humana). Al comienzo del Psychiatrist, le harn una prueba de deteccin del VIH. Si corre un riesgo alto de [place] VIH, pueden repetirle esta prueba durante el tercer trimestre del embarazo. Pueden indicarle otro tipo de estudios segn su edad, sus antecedentes mdicos personales o familiares, u otros factores. CON QU FRECUENCIA DEBO VISITAR AL MDICO PARA LOS CUIDADOS PRENATALES? El programa de control correspondiente a los cuidados prenatales depender de cualquier enfermedad que usted tenga desde antes del embarazo o que haya desarrollado durante el mismo. Si usted no tiene Agricultural engineer, es probable que le hagan los siguientes controles:  Physiological scientist vez al mes durante los primeros de [place].  Dos veces al mes durante el sptimo y el octavo mes de [place].  Una vez a la Boston Scientific noveno mes de Psychiatrist y [place]. Si presenta signos de trabajo de parto prematuro u otros signos o sntomas preocupantes, es posible que deba ver al mdico con ms frecuencia. Consulte al [employer] Inc programa de cuidados prenatales ms adecuado para su caso. QU PUEDO HACER PARA QUE EL BEB Y YO ESTEMOS TAN SANOS COMO SEA POSIBLE DURANTE EL EMBARAZO?  Tome una vitamina prenatal que contenga (0,4mg ) de cido Ecolab. El mdico tambin puede indicarle que tome vitaminas adicionales, como yodo, vitaminaD, hierro, cobre y zinc.  [place] de 1500 a 2000mg  de Fiserv desde la semana20 de Counsellor.  Asegrese de estar al da con las vacunas. A menos que el mdico le indique otra cosa: ? Debe aplicarse la vacuna contra la difteria, el ttanos y la tosferina (Tdap) entre la semana27 y 36de embarazo, independientemente de la fecha en la que recibi la ltima vacuna  Tdap. Esta vacuna ayuda a proteger al beb contra la tosferina despus del nacimiento. ? Debe recibir una vacuna antigripal inactivada (IIV) anual como ayuda para protegerlos a usted y al beb de la gripe. Puede recibirla en cualquier momento del embarazo.  Siga una dieta bien equilibrada, que incluya lo siguiente: ? Nils Pyle y verduras frescas. ? Protenas magras. ? Alimentos con FedEx de calcio, [place], Anthem, quesos duros y verduras de hojas color verde oscuro. ? Panes integrales.  No coma frutos de mar con alto contenido de mercurio, por ejemplo: ? Pez espada. ? [place]. ? [place]. ? [place]. ? Ms de Sabino Snipes de atn por semana.  No coma lo siguiente: ? Carnes o huevos crudos o mal cocidos. ? Alimentos no pasteurizados, como quesos blandos (brie, [place] o feta), jugos y [place]. ? Embutidos. ? Salchichas que no se cocinaron en agua hirviendo.  Beba suficiente agua para mantener la orina clara o de color amarillo plido. Para muchas mujeres, la cantidad es de 10 o ms vasos de 8onzas de Regulatory affairs officer. El hecho de mantenerse hidratada ayuda a que el feto reciba nutrientes y Network engineer el inicio de contracciones uterinas prematuras.  No consuma ningn producto que contenga tabaco, como cigarrillos, tabaco de Theatre manager o Administrator, Civil Service. Si necesita ayuda para dejar de fumar, consulte al mdico.  No consuma bebidas que contengan alcohol. No se ha determinado que haya un nivel de consumo de alcohol que sea  inocuo durante el embarazo.  No consuma drogas. Estas pueden daar al feto en desarrollo o causar un aborto espontneo.  Consulte al mdico o al farmacutico antes de tomar cualquier medicamento recetado o de venta libre, hierbas o suplementos.  Limite el consumo de cafena a no ms de 200mg  por da.  Haga actividad fsica. A menos que el mdico le indique otra cosa, intente hacer 30minutos de ejercicio moderado la mayora de los [address]  [address]das de la [place] Manorsemana. No practique  actividades de alto impacto, deportes de contacto o actividades con alto riesgo de cadas, como equitacin o esqu extremo.  Descanse lo suficiente.  Evite todo aquello que aumente la temperatura corporal, como jacuzzis y saunas.  Si tiene un gato, no vace la bandeja sanitaria. Las bacterias presentes en las heces del gato pueden causar una infeccin llamada toxoplasmosis. Esta puede daar gravemente al feto.  Aljese de las sustancias qumicas como insecticidas, plomo y [place]mercurio, y de los productos de limpieza o pinturas que contengan solventes.  No se saque ninguna radiografa, excepto si es necesaria por razones mdicas.  Tome una clase de preparacin para el parto y Mining engineerel amamantamiento. Pregntele al mdico si necesita una derivacin o una recomendacin.  Esta informacin no tiene Theme park managercomo fin reemplazar el consejo del mdico. Asegrese de hacerle al mdico cualquier pregunta que tenga. Document Released: 07/19/2007 Document Revised: 05/24/2015 Document Reviewed: 04/16/2013 Elsevier Interactive Patient Education  2017 [employer]

## 2017-09-07 ENCOUNTER — Other Ambulatory Visit (INDEPENDENT_AMBULATORY_CARE_PROVIDER_SITE_OTHER): Payer: Self-pay

## 2017-09-07 DIAGNOSIS — Z3492 Encounter for supervision of normal pregnancy, unspecified, second trimester: Secondary | ICD-10-CM

## 2017-09-07 LAB — POCT 1 HR PRENATAL GLUCOSE: GLUCOSE 1 HR PRENATAL, POC: 156 mg/dL

## 2017-09-10 ENCOUNTER — Other Ambulatory Visit (INDEPENDENT_AMBULATORY_CARE_PROVIDER_SITE_OTHER): Payer: Self-pay

## 2017-09-10 DIAGNOSIS — Z3492 Encounter for supervision of normal pregnancy, unspecified, second trimester: Secondary | ICD-10-CM

## 2017-09-10 LAB — POCT CBG (FASTING - GLUCOSE)-MANUAL ENTRY: Glucose Fasting, POC: 86 mg/dL (ref 70–99)

## 2017-09-11 LAB — GESTATIONAL GLUCOSE TOLERANCE
GLUCOSE 1 HOUR GTT: 143 mg/dL (ref 65–179)
Glucose, Fasting: 75 mg/dL (ref 65–94)
Glucose, GTT - 2 Hour: 107 mg/dL (ref 65–154)
Glucose, GTT - 3 Hour: 92 mg/dL (ref 65–139)

## 2017-09-20 ENCOUNTER — Encounter: Payer: Self-pay | Admitting: Family Medicine

## 2017-09-20 ENCOUNTER — Ambulatory Visit (INDEPENDENT_AMBULATORY_CARE_PROVIDER_SITE_OTHER): Payer: Self-pay | Admitting: Family Medicine

## 2017-09-20 DIAGNOSIS — Z3493 Encounter for supervision of normal pregnancy, unspecified, third trimester: Secondary | ICD-10-CM | POA: Insufficient documentation

## 2017-09-20 NOTE — Progress Notes (Signed)
     Subjective: Chief Complaint  Patient presents with  . Routine Prenatal Visit     HPI: Bonnie Mcneil is a 37 y.o. presenting to clinic today to discuss the following:  Prenatal OB Visit No headache, seeing spots or having any vision changes. She endorses fetal movement and denies leakage, bleeding, pain.  She states she is drinking more water and at times feels dehydrated but no syncope, dizziness, or lack of urination.  Health Maintenance: None     ROS noted in HPI.   Past Medical, Surgical, Social, and Family History Reviewed & Updated per EMR.   Pertinent Historical Findings include:   Social History   Tobacco Use  Smoking Status Never Smoker  Smokeless Tobacco Never Used    Objective: BP 110/70   Pulse 74   Temp 98.7 F (37.1 C)   Wt 259 lb 9.6 oz (117.8 kg)   LMP 03/07/2017 (Approximate)   BMI 40.66 kg/m  Vitals and nursing notes reviewed  Physical Exam Gen: Alert and Oriented x 3, NAD HEENT: Normocephalic, atraumatic, PERRLA, EOMI Neck: trachea midline, no thyroidmegaly, no LAD CV: RRR, no murmurs, normal S1, S2 split, +2 pulses dorsalis pedis bilaterally, no JVD, no carotid bruits Resp: CTAB, no wheezing, rales, or rhonchi, comfortable work of breathing Abd: gravid abdomen, distended, non-tender, soft, uterus measuring 30cm, fetal heart rate 144, +bs in all four quadrants MSK: Moving all four extremities Ext: no clubbing, cyanosis, or edema Neuro: CN II-XII intact, no focal or gross deficits Skin: warm, dry, intact, no rashes Psych: appropriate behavior, mood, denies any suicidal ideation  No results found for this or any previous visit (from the past 72 hour(s)).  Assessment/Plan:  Third trimester pregnancy Pregnancy progressing well with no current concerns. Return at 30 weeks for routine OB visit.   PATIENT EDUCATION PROVIDED: See AVS    Diagnosis and plan along with any newly prescribed medication(s) were discussed in detail with  this patient today. The patient verbalized understanding and agreed with the plan. Patient advised if symptoms worsen return to clinic or ER.   Health Maintainance:   No orders of the defined types were placed in this encounter.   No orders of the defined types were placed in this encounter.    [name], DO 09/25/2017, 2:11 PM PGY-2 [facility]

## 2017-09-20 NOTE — Patient Instructions (Signed)
It was great to see you today! Thank you for letting me participate in your care!  Today, we discussed your current pregnancy and you are doing well, keep up the good work and continue to drink plenty of water.  Third Trimester of Pregnancy The third trimester is from week 29 through week 42, months 7 through 9. This trimester is when your unborn baby (fetus) is growing very fast. At the end of the ninth month, the unborn baby is about 20 inches in length. It weighs about 6-10 pounds. Follow these instructions at home:  Avoid all smoking, herbs, and alcohol. Avoid drugs not approved by your doctor.  Do not use any tobacco products, including cigarettes, chewing tobacco, and electronic cigarettes. If you need help quitting, ask your doctor. You may get counseling or other support to help you quit.  Only take medicine as told by your doctor. Some medicines are safe and some are not during pregnancy.  Exercise only as told by your doctor. Stop exercising if you start having cramps.  Eat regular, healthy meals.  Wear a good support bra if your breasts are tender.  Do not use hot tubs, steam rooms, or saunas.  Wear your seat belt when driving.  Avoid raw meat, uncooked cheese, and liter boxes and soil used by cats.  Take your prenatal vitamins.  Take 1500-2000 milligrams of calcium daily starting at the 20th week of pregnancy until you deliver your baby.  Try taking medicine that helps you poop (stool softener) as needed, and if your doctor approves. Eat more fiber by eating fresh fruit, vegetables, and whole grains. Drink enough fluids to keep your pee (urine) clear or pale yellow.  Take warm water baths (sitz baths) to soothe pain or discomfort caused by hemorrhoids. Use hemorrhoid cream if your doctor approves.  If you have puffy, bulging veins (varicose veins), wear support hose. Raise (elevate) your feet for 15 minutes, 3-4 times a day. Limit salt in your diet.  Avoid heavy  lifting, wear low heels, and sit up straight.  Rest with your legs raised if you have leg cramps or low back pain.  Visit your dentist if you have not gone during your pregnancy. Use a soft toothbrush to brush your teeth. Be gentle when you floss.  You can have sex (intercourse) unless your doctor tells you not to.  Do not travel far distances unless you must. Only do so with your doctor's approval.  Take prenatal classes.  Practice driving to the hospital.  Pack your hospital bag.  Prepare the baby's room.  Go to your doctor visits. Get help if:  You are not sure if you are in labor or if your water has broken.  You are dizzy.  You have mild cramps or pressure in your lower belly (abdominal).  You have a nagging pain in your belly area.  You continue to feel sick to your stomach (nauseous), throw up (vomit), or have watery poop (diarrhea).  You have bad smelling fluid coming from your vagina.  You have pain with peeing (urination). Get help right away if:  You have a fever.  You are leaking fluid from your vagina.  You are spotting or bleeding from your vagina.  You have severe belly cramping or pain.  You lose or gain weight rapidly.  You have trouble catching your breath and have chest pain.  You notice sudden or extreme puffiness (swelling) of your face, hands, ankles, feet, or legs.  You have not felt  the baby move in over an hour.  You have severe headaches that do not go away with medicine.  You have vision changes. This information is not intended to replace advice given to you by your health care provider. Make sure you discuss any questions you have with your health care provider. Document Released: 04/26/2009 Document Revised: 07/08/2015 Document Reviewed: 04/02/2012 Elsevier Interactive Patient Education  2017 [employer].   Be well, Jules Schick, DO PGY-2, [facility]

## 2017-09-25 NOTE — Assessment & Plan Note (Signed)
Pregnancy progressing well with no current concerns. Return at 30 weeks for routine OB visit.

## 2017-10-04 ENCOUNTER — Ambulatory Visit (INDEPENDENT_AMBULATORY_CARE_PROVIDER_SITE_OTHER): Payer: Self-pay | Admitting: Family Medicine

## 2017-10-04 VITALS — BP 112/70 | HR 85 | Temp 98.7°F | Wt 263.4 lb

## 2017-10-04 DIAGNOSIS — Z3493 Encounter for supervision of normal pregnancy, unspecified, third trimester: Secondary | ICD-10-CM

## 2017-10-04 NOTE — Patient Instructions (Signed)
It was great to see you today! Thank you for letting me participate in your care!  Today, we discussed your ongoing pregnancy. You are doing well and your physical exam was normal today. Keep up the great work! Please call if you have abdominal discomfort, contractions, or vaginal bleeding/discharge.  We will obtain labs today. Please don't forget to get your Tetanus shot from the health department.  I will see you in two weeks.  Be well, Jules Schick, DO PGY-2, [facility]    Third Trimester of Pregnancy The third trimester is from week 29 through week 42, months 7 through 9. This trimester is when your unborn baby (fetus) is growing very fast. At the end of the ninth month, the unborn baby is about 20 inches in length. It weighs about 6-10 pounds. Follow these instructions at home:  Avoid all smoking, herbs, and alcohol. Avoid drugs not approved by your doctor.  Do not use any tobacco products, including cigarettes, chewing tobacco, and electronic cigarettes. If you need help quitting, ask your doctor. You may get counseling or other support to help you quit.  Only take medicine as told by your doctor. Some medicines are safe and some are not during pregnancy.  Exercise only as told by your doctor. Stop exercising if you start having cramps.  Eat regular, healthy meals.  Wear a good support bra if your breasts are tender.  Do not use hot tubs, steam rooms, or saunas.  Wear your seat belt when driving.  Avoid raw meat, uncooked cheese, and liter boxes and soil used by cats.  Take your prenatal vitamins.  Take 1500-2000 milligrams of calcium daily starting at the 20th week of pregnancy until you deliver your baby.  Try taking medicine that helps you poop (stool softener) as needed, and if your doctor approves. Eat more fiber by eating fresh fruit, vegetables, and whole grains. Drink enough fluids to keep your pee (urine) clear or pale yellow.  Take warm water  baths (sitz baths) to soothe pain or discomfort caused by hemorrhoids. Use hemorrhoid cream if your doctor approves.  If you have puffy, bulging veins (varicose veins), wear support hose. Raise (elevate) your feet for 15 minutes, 3-4 times a day. Limit salt in your diet.  Avoid heavy lifting, wear low heels, and sit up straight.  Rest with your legs raised if you have leg cramps or low back pain.  Visit your dentist if you have not gone during your pregnancy. Use a soft toothbrush to brush your teeth. Be gentle when you floss.  You can have sex (intercourse) unless your doctor tells you not to.  Do not travel far distances unless you must. Only do so with your doctor's approval.  Take prenatal classes.  Practice driving to the hospital.  Pack your hospital bag.  Prepare the baby's room.  Go to your doctor visits. Get help if:  You are not sure if you are in labor or if your water has broken.  You are dizzy.  You have mild cramps or pressure in your lower belly (abdominal).  You have a nagging pain in your belly area.  You continue to feel sick to your stomach (nauseous), throw up (vomit), or have watery poop (diarrhea).  You have bad smelling fluid coming from your vagina.  You have pain with peeing (urination). Get help right away if:  You have a fever.  You are leaking fluid from your vagina.  You are spotting or bleeding from  your vagina.  You have severe belly cramping or pain.  You lose or gain weight rapidly.  You have trouble catching your breath and have chest pain.  You notice sudden or extreme puffiness (swelling) of your face, hands, ankles, feet, or legs.  You have not felt the baby move in over an hour.  You have severe headaches that do not go away with medicine.  You have vision changes. This information is not intended to replace advice given to you by your health care provider. Make sure you discuss any questions you have with your health  care provider. Document Released: 04/26/2009 Document Revised: 07/08/2015 Document Reviewed: 04/02/2012 Elsevier Interactive Patient Education  2017 [employer]

## 2017-10-04 NOTE — Progress Notes (Signed)
      Subjective: Chief Complaint  Patient presents with  . Routine Prenatal Visit     HPI: Bonnie Mcneil is a 37 y.o. presenting to clinic today to discuss the following:  30 week Routine OB visit Bonnie Mcneil is a 37 y.o. [id number] at 4968w1d here for routine follow up.  See flow sheet for details.  A/P: Pregnancy at 368w1d.  Doing well.   Pregnancy issues include Adopt-a-mom. Preterm labor and fetal movement precautions reviewed. Follow up 2 weeks.  Patient reports no issues or concerns. She denies any contractions, abdominal pain, vaginal discharge, vaginal bleeding. She is not having any headaches or vision changes.  She is experiencing consistent daily fetal movement and PHQ-9 was Home pregnancy form given  Health Maintenance: None     ROS noted in HPI.   Past Medical, Surgical, Social, and Family History Reviewed & Updated per EMR.   Pertinent Historical Findings include:   Social History   Tobacco Use  Smoking Status Never Smoker  Smokeless Tobacco Never Used    Objective: BP 112/70   Pulse 85   Temp 98.7 F (37.1 C)   Wt 263 lb 6.4 oz (119.5 kg)   LMP 03/07/2017 (Approximate)   BMI 41.25 kg/m  Vitals and nursing notes reviewed  Physical Exam Gen: Alert and Oriented x 3, NAD CV: RRR, no murmurs, normal S1, S2 split Resp: CTAB, no wheezing, rales, or rhonchi, comfortable work of breathing Abd: gravid abdomen, non-tender, soft, +bs in all four quadrants; Fetal heart rate 141-145 Uterus 31.5cm MSK: Moves all four extremities Ext: no clubbing, cyanosis, or edema Skin: warm, dry, intact, no rashes   No results found for this or any previous visit (from the past 72 hour(s)).  Assessment/Plan:  Third trimester pregnancy Obtained 3rd Trimester labs today, PHQ-9 given with score of 0. Patient still has not obtained Tetanus shot but does understand importance of going to get it at the health department via Adopt-a-mom program. Paperwork for that  program filled out today.  Follow up in 2 weeks.   PATIENT EDUCATION PROVIDED: See AVS    Diagnosis and plan along with any newly prescribed medication(s) were discussed in detail with this patient today. The patient verbalized understanding and agreed with the plan. Patient advised if symptoms worsen return to clinic or ER.   Health Maintainance:   Orders Placed This Encounter  Procedures  . CBC  . RPR  . HIV antibody (with reflex)    No orders of the defined types were placed in this encounter.    Jules Schickim [name], DO 10/07/2017, 10:53 PM PGY-2 [facility]

## 2017-10-05 LAB — CBC
Hematocrit: 34.3 % (ref 34.0–46.6)
Hemoglobin: 11.5 g/dL (ref 11.1–15.9)
MCH: 28.8 pg (ref 26.6–33.0)
MCHC: 33.5 g/dL (ref 31.5–35.7)
MCV: 86 fL (ref 79–97)
Platelets: 245 10*3/uL (ref 150–450)
RBC: 3.99 x10E6/uL (ref 3.77–5.28)
RDW: 14.8 % (ref 12.3–15.4)
WBC: 10.5 10*3/uL (ref 3.4–10.8)

## 2017-10-05 LAB — HIV ANTIBODY (ROUTINE TESTING W REFLEX): HIV Screen 4th Generation wRfx: NONREACTIVE

## 2017-10-05 LAB — RPR: RPR: NONREACTIVE

## 2017-10-07 NOTE — Assessment & Plan Note (Signed)
Obtained 3rd Trimester labs today, PHQ-9 given with score of 0. Patient still has not obtained Tetanus shot but does understand importance of going to get it at the health department via Adopt-a-mom program. Paperwork for that program filled out today.  Follow up in 2 weeks.

## 2017-10-19 ENCOUNTER — Ambulatory Visit (INDEPENDENT_AMBULATORY_CARE_PROVIDER_SITE_OTHER): Payer: Self-pay | Admitting: Family Medicine

## 2017-10-19 VITALS — BP 110/70 | HR 89 | Temp 97.6°F | Wt 262.0 lb

## 2017-10-19 DIAGNOSIS — Z3493 Encounter for supervision of normal pregnancy, unspecified, third trimester: Secondary | ICD-10-CM

## 2017-10-19 MED ORDER — DIPHENHYDRAMINE HCL 50 MG PO TABS: 50.0000 mg | ORAL_TABLET | Freq: Three times a day (TID) | ORAL | 0 refills | Status: DC | PRN

## 2017-10-19 NOTE — Progress Notes (Signed)
Bonnie Mcneil is a 37 y.o. [id number] at [redacted]w[redacted]d here for routine follow up.  She reports no vaginal bleeding, no contraction, no loss of fluids, and positive fetal movement. Vitals were reviewed at this visit and normal. See flow sheet for details.  Objective: General: NAD Cardio: RRR, no murmurs Resp:CTAB, no wheezing, crackles, NWOB FHT: 136bpm Fundal height: 34cm  A/P: Pregnancy at [redacted]w[redacted]d.  Doing well.   Pregnancy issues include Adopt-A-Mom. She will be receiving her Tetanus shot next week as she went to the health department and they had none available. Flonase for rhinorrhea and congestion, Tylenol for pain and fever due to likely viral URI during pregnancy. She has been GBS positive in the past so will require treatment once she is more than 35 weeks. Preterm labor and fetal movement precautions reviewed. Follow up 2 weeks.

## 2017-10-19 NOTE — Patient Instructions (Addendum)
It was great to see you today! Thank you for letting me participate in your care!  Today, we discussed your congestion and runny nose. It is most likely a viral upper respiratory infection. Please use Benadryl with caution as it can be sedating. Otherwise, continue taking Tylenol.  We will see you in two weeks.  Be well, Jules Schick, DO PGY-2, [facility]

## 2017-11-02 ENCOUNTER — Ambulatory Visit (INDEPENDENT_AMBULATORY_CARE_PROVIDER_SITE_OTHER): Payer: Self-pay | Admitting: Family Medicine

## 2017-11-02 ENCOUNTER — Other Ambulatory Visit: Payer: Self-pay

## 2017-11-02 ENCOUNTER — Other Ambulatory Visit (HOSPITAL_COMMUNITY): Admission: RE | Admit: 2017-11-02 | Payer: Self-pay | Source: Ambulatory Visit | Admitting: Family Medicine

## 2017-11-02 VITALS — BP 102/72 | HR 72 | Temp 98.8°F | Wt 265.0 lb

## 2017-11-02 DIAGNOSIS — Z3493 Encounter for supervision of normal pregnancy, unspecified, third trimester: Secondary | ICD-10-CM

## 2017-11-02 LAB — OB RESULTS CONSOLE GC/CHLAMYDIA: GC PROBE AMP, GENITAL: NEGATIVE

## 2017-11-02 NOTE — Patient Instructions (Signed)
It was great to see you today! Thank you for letting me participate in your care!  Today, we discussed your pregnancy and I am glad you are doing so well. I will call you if the results from your cervical culture are abnormal. I will see you in two weeks, after that please return once a week.  Be well, Jules Schick, DO PGY-2, [facility]    Third Trimester of Pregnancy The third trimester is from week 29 through week 42, months 7 through 9. This trimester is when your unborn baby (fetus) is growing very fast. At the end of the ninth month, the unborn baby is about 20 inches in length. It weighs about 6-10 pounds. Follow these instructions at home:  Avoid all smoking, herbs, and alcohol. Avoid drugs not approved by your doctor.  Do not use any tobacco products, including cigarettes, chewing tobacco, and electronic cigarettes. If you need help quitting, ask your doctor. You may get counseling or other support to help you quit.  Only take medicine as told by your doctor. Some medicines are safe and some are not during pregnancy.  Exercise only as told by your doctor. Stop exercising if you start having cramps.  Eat regular, healthy meals.  Wear a good support bra if your breasts are tender.  Do not use hot tubs, steam rooms, or saunas.  Wear your seat belt when driving.  Avoid raw meat, uncooked cheese, and liter boxes and soil used by cats.  Take your prenatal vitamins.  Take 1500-2000 milligrams of calcium daily starting at the 20th week of pregnancy until you deliver your baby.  Try taking medicine that helps you poop (stool softener) as needed, and if your doctor approves. Eat more fiber by eating fresh fruit, vegetables, and whole grains. Drink enough fluids to keep your pee (urine) clear or pale yellow.  Take warm water baths (sitz baths) to soothe pain or discomfort caused by hemorrhoids. Use hemorrhoid cream if your doctor approves.  If you have puffy,  bulging veins (varicose veins), wear support hose. Raise (elevate) your feet for 15 minutes, 3-4 times a day. Limit salt in your diet.  Avoid heavy lifting, wear low heels, and sit up straight.  Rest with your legs raised if you have leg cramps or low back pain.  Visit your dentist if you have not gone during your pregnancy. Use a soft toothbrush to brush your teeth. Be gentle when you floss.  You can have sex (intercourse) unless your doctor tells you not to.  Do not travel far distances unless you must. Only do so with your doctor's approval.  Take prenatal classes.  Practice driving to the hospital.  Pack your hospital bag.  Prepare the baby's room.  Go to your doctor visits. Get help if:  You are not sure if you are in labor or if your water has broken.  You are dizzy.  You have mild cramps or pressure in your lower belly (abdominal).  You have a nagging pain in your belly area.  You continue to feel sick to your stomach (nauseous), throw up (vomit), or have watery poop (diarrhea).  You have bad smelling fluid coming from your vagina.  You have pain with peeing (urination). Get help right away if:  You have a fever.  You are leaking fluid from your vagina.  You are spotting or bleeding from your vagina.  You have severe belly cramping or pain.  You lose or gain weight rapidly.  You have trouble catching your breath and have chest pain.  You notice sudden or extreme puffiness (swelling) of your face, hands, ankles, feet, or legs.  You have not felt the baby move in over an hour.  You have severe headaches that do not go away with medicine.  You have vision changes. This information is not intended to replace advice given to you by your health care provider. Make sure you discuss any questions you have with your health care provider. Document Released: 04/26/2009 Document Revised: 07/08/2015 Document Reviewed: 04/02/2012 Elsevier Interactive Patient  Education  2017 [employer]

## 2017-11-02 NOTE — Progress Notes (Signed)
Bonnie Mcneil is a 37 y.o. [id number] at 745w2d here for routine follow up.  She reports no concerns and denies any vaginal bleeding, contractions, or loss of fluid. Baby is moving. See flow sheet for details.  Objective: FHT: 130 Fundal height: 36cm  A/P: Pregnancy at 225w2d. Doing well. Return in two weeks, after that she will have 1 week follow up. Repeat third trimester GC/CC cervical cultures today. Patient did go to health department and received Tetanus shot. Patient has previously been GBS+ no need for GBS culture at 35+ weeks, treat as GBS positive. No Pregnancy issues, patient is Adopt a Mom. Preterm labor and fetal movement precautions reviewed.

## 2017-11-05 LAB — CERVICOVAGINAL ANCILLARY ONLY
CHLAMYDIA, DNA PROBE: NEGATIVE
NEISSERIA GONORRHEA: NEGATIVE

## 2017-11-13 LAB — OB RESULTS CONSOLE GBS: GBS: NEGATIVE

## 2017-11-16 ENCOUNTER — Encounter: Payer: Self-pay | Admitting: Family Medicine

## 2017-11-16 ENCOUNTER — Other Ambulatory Visit: Payer: Self-pay

## 2017-11-16 ENCOUNTER — Ambulatory Visit (INDEPENDENT_AMBULATORY_CARE_PROVIDER_SITE_OTHER): Payer: Self-pay | Admitting: Family Medicine

## 2017-11-16 VITALS — BP 98/60 | HR 79 | Temp 98.2°F | Wt 267.0 lb

## 2017-11-16 DIAGNOSIS — Z3483 Encounter for supervision of other normal pregnancy, third trimester: Secondary | ICD-10-CM

## 2017-11-16 DIAGNOSIS — Z3493 Encounter for supervision of normal pregnancy, unspecified, third trimester: Secondary | ICD-10-CM

## 2017-11-16 NOTE — Progress Notes (Signed)
  Subjective:    Bonnie Mcneil is a 37 y.o. [id number] [redacted]w[redacted]d being seen today for her obstetrical visit.  Patient reports no bleeding, no contractions, no cramping and no leaking. Fetal movement: normal.  Objective:    BP 98/60   Pulse 79   Temp 98.2 F (36.8 C)   Wt 267 lb (121.1 kg)   LMP 03/07/2017 (Approximate)   BMI 41.82 kg/m   Physical Exam  Maternal Exam:  Abdomen: Patient reports no abdominal tenderness. Introitus: Normal vulva. Normal vagina.  Pelvis: adequate for delivery.   Cervix: not evaluated.    FHT: Fetal Heart Rate (bpm): 130  Uterine Size: Fundal Height: 38 cm  Presentation:       Assessment:    Pregnancy:  [id number]    Plan:    Patient Active Problem List   Diagnosis Date Noted  . Third trimester pregnancy 09/20/2017  . Second trimester pregnancy 08/20/2017  . Environmental and seasonal allergies 12/05/2016  . Obesity 06/11/2016  . Group B Streptococcus carrier, +RV culture, currently pregnant 05/17/2015   GBS collected today, GC/CH was collected at previous office visit. Routine follow up visit    Follow up in 1 Week.

## 2017-11-16 NOTE — Progress Notes (Deleted)
Bonnie Mcneil is a 37 y.o. [id number] at [redacted]w[redacted]d here for routine follow up.  She reports ***. See flow sheet for details.  A/P: Pregnancy at [redacted]w[redacted]d.  Doing well.   Pregnancy issues include ***.  Infant feeding choice: *** Contraception choice: *** Infant circumcision desired: {Response; yes/no/na:63}  Tdap {was/was not:19854::"was"} given today. GBS and gc/chlamydia testing {was/was not:19854::"was"} performed today.  Preterm labor and fetal movement precautions reviewed. Safe sleep discussed. Follow up {1/2:30633} week(s).

## 2017-11-16 NOTE — Patient Instructions (Signed)

## 2017-11-20 LAB — CULTURE, BETA STREP (GROUP B ONLY): Strep Gp B Culture: NEGATIVE

## 2017-11-26 ENCOUNTER — Ambulatory Visit (INDEPENDENT_AMBULATORY_CARE_PROVIDER_SITE_OTHER): Payer: Self-pay | Admitting: Family Medicine

## 2017-11-26 ENCOUNTER — Other Ambulatory Visit: Payer: Self-pay

## 2017-11-26 VITALS — BP 102/62 | HR 84 | Temp 98.4°F | Wt 269.8 lb

## 2017-11-26 DIAGNOSIS — Z3493 Encounter for supervision of normal pregnancy, unspecified, third trimester: Secondary | ICD-10-CM

## 2017-11-26 NOTE — Patient Instructions (Addendum)
Look at [email or web address] for breastfeeding tips including what is normal and when milk will come in   Intrauterine Device Information An intrauterine device (IUD) is inserted into your uterus to prevent pregnancy. There are two types of IUDs available:  Copper IUD-This type of IUD is wrapped in copper wire and is placed inside the uterus. Copper makes the uterus and fallopian tubes produce a fluid that kills sperm. The copper IUD can stay in place for 10 years.  Hormone IUD-This type of IUD contains the hormone progestin (synthetic progesterone). The hormone thickens the cervical mucus and prevents sperm from entering the uterus. It also thins the uterine lining to prevent implantation of a fertilized egg. The hormone can weaken or kill the sperm that get into the uterus. One type of hormone IUD can stay in place for 5 years, and another type can stay in place for 3 years.  Your health care provider will make sure you are a good candidate for a contraceptive IUD. Discuss with your health care provider the possible side effects. Advantages of an intrauterine device  IUDs are highly effective, reversible, long acting, and low maintenance.  There are no estrogen-related side effects.  An IUD can be used when breastfeeding.  IUDs are not associated with weight gain.  The copper IUD works immediately after insertion.  The hormone IUD works right away if inserted within 7 days of your period starting. You will need to use a backup method of birth control for 7 days if the hormone IUD is inserted at any other time in your cycle.  The copper IUD does not interfere with your female hormones.  The hormone IUD can make heavy menstrual periods lighter and decrease cramping.  The hormone IUD can be used for 3 or 5 years.  The copper IUD can be used for 10 years. Disadvantages of an intrauterine device  The hormone IUD can be associated with irregular bleeding patterns.  The copper IUD can make your  menstrual flow heavier and more painful.  You may experience cramping and vaginal bleeding after insertion. This information is not intended to replace advice given to you by your health care provider. Make sure you discuss any questions you have with your health care provider. Document Released: 01/04/2004 Document Revised: 07/08/2015 Document Reviewed: 07/21/2012 Elsevier Interactive Patient Education  2017 [employer].

## 2017-11-26 NOTE — Progress Notes (Signed)
BEVERLYANN BROXTERMAN is a 37 y.o. [id number] at [redacted]w[redacted]d here for routine follow up.  She reports no concerns. Occasional contractions. Constant movement.  See flow sheet for details.  A/P: Pregnancy at [redacted]w[redacted]d. Doing well.   Pregnancy issues include none.  Infant feeding choice: unsure - has tried to BF all kids for the first months, gave formula w the last baby after 6 hrs due to concern for not having enough milk  Contraception choice: undecided, has tried nexplanon in the past (didn't like 2/2 perception of weight gain), also tried depo and gained weight. Has liked OCPs bc she felt like she didn't gain weight.  Infant circumcision desired: not applicable  GBS and gc/chlamydia testing results were reviewed today.   Labor and fetal movement precautions reviewed. Follow up 1 week. Loni Muse, MD PGY 3 FM

## 2017-12-03 ENCOUNTER — Encounter: Payer: Self-pay | Admitting: Family Medicine

## 2017-12-03 DIAGNOSIS — O09529 Supervision of elderly multigravida, unspecified trimester: Secondary | ICD-10-CM | POA: Insufficient documentation

## 2017-12-04 ENCOUNTER — Ambulatory Visit (INDEPENDENT_AMBULATORY_CARE_PROVIDER_SITE_OTHER): Payer: Self-pay | Admitting: Family Medicine

## 2017-12-04 VITALS — BP 100/80 | HR 84 | Temp 98.6°F | Wt 277.0 lb

## 2017-12-04 DIAGNOSIS — Z3493 Encounter for supervision of normal pregnancy, unspecified, third trimester: Secondary | ICD-10-CM

## 2017-12-04 DIAGNOSIS — O09529 Supervision of elderly multigravida, unspecified trimester: Secondary | ICD-10-CM

## 2017-12-04 DIAGNOSIS — O09523 Supervision of elderly multigravida, third trimester: Secondary | ICD-10-CM

## 2017-12-04 NOTE — Progress Notes (Signed)
  Patient Name: [name] Date of Birth: 1980-03-05 Date of Visit: 12/04/17 PCP: Arlyce Harman, DO  Chief Complaint: prenatal care  Subjective: Bonnie Mcneil is a pleasant [phone] at 38 weeks and 6 days as dated by last menstrual period which is consistent with a second trimester ultrasound. She has no unusual complaints today. Reports good fetal movement. Denies contractions, loss of fluid, or bleeding.  She reports she is excited for this baby.  She has not yet decided on contraception. ROS:  ROS As above I have reviewed the patient's medical, surgical, family, and social history as appropriate.   Pertinent PMH:  Obesity with a BMI greater than 39 at the start of her pregnancy  Prior Obstetric History: four prior vaginal deliveries, three requiring induction of labor   Complications of Current Pregnancy: Obesity affecting pregnancy and advanced maternal age  Vitals:   12/04/17 1017  BP: 100/80  Pulse: 84  Temp: 98.6 F (37 C)   Last Weight  Most recent update: 12/04/2017 10:18 AM   Weight  125.6 kg (277 lb)           Pregravid weight not on file Could not be calculated Filed Weights   12/04/17 1017  Weight: 277 lb (125.6 kg)   The patient has had a notable 8 pound weight gain since last week.  Fundal height measured at 39 cm.  Fetal heart tones at 145 today.  Vertex on ultrasound. Cervix is 1/10/-3 anterior soft, membranes were not stripped as she is not 39 weeks.  Diagnoses and all orders for this visit:  Third trimester pregnancy   Anticipatory Guidance and Prenatal Education provided on the following topics: - Reasons to present to [facility] - Nutrition in pregnancy - Contraception postpartum - Breastfeed - Safe sleep for infant  - Reviewed contraception again - Reviewed guidance about weight gain in pregnancy  -The patient is scheduled for a follow-up prenatal visit next week.  She is also scheduled for her biophysical profile between 40 and 41  weeks.  She is scheduled for an induction of labor at 41 weeks.  Bishop score today is 5 given her parity. -In a prior pregnancy she was indeed GBS positive.  She has not had a GBS positive urine culture or positive GBS rectovaginal swab during this pregnancy.  No indication for intrapartum antibiotics unless clinical signs develop.  None of her infants have been affected with a GBS disease including GBS meningitis or bacteremia  Terisa Starr, MD  [facility] Teaching Service

## 2017-12-04 NOTE — Patient Instructions (Addendum)
Congratulations! You are almost to your due date:  Next steps:  1. Schedule an appointment for 1 week from now 2. Attend your antenatal testing as scheduled. Thursday,  October 31st at 11:15 AM  3. Your induction of labor is scheduled on Wednesday December 19, 2017 at 7:30 AM   It was wonderful to see you today.  Thank you for choosing [facility].   Please call [phone] with any questions about today's appointment.  Please be sure to schedule follow up at the front  desk before you leave today.   Terisa Starr, MD  Family Medicine

## 2017-12-05 ENCOUNTER — Telehealth (HOSPITAL_COMMUNITY): Payer: Self-pay | Admitting: *Deleted

## 2017-12-05 ENCOUNTER — Encounter (HOSPITAL_COMMUNITY): Payer: Self-pay | Admitting: *Deleted

## 2017-12-05 NOTE — Telephone Encounter (Signed)
Preadmission screen  

## 2017-12-10 ENCOUNTER — Other Ambulatory Visit: Payer: Self-pay | Admitting: Family Medicine

## 2017-12-11 ENCOUNTER — Inpatient Hospital Stay (HOSPITAL_COMMUNITY)
Admission: AD | Admit: 2017-12-11 | Discharge: 2017-12-11 | Disposition: A | Discharge status: Home / Self Care | Payer: Self-pay | Source: Ambulatory Visit | Attending: Family Medicine | Admitting: Family Medicine

## 2017-12-11 ENCOUNTER — Other Ambulatory Visit: Payer: Self-pay

## 2017-12-11 ENCOUNTER — Ambulatory Visit (INDEPENDENT_AMBULATORY_CARE_PROVIDER_SITE_OTHER): Payer: Self-pay | Admitting: Family Medicine

## 2017-12-11 ENCOUNTER — Encounter (HOSPITAL_COMMUNITY): Payer: Self-pay

## 2017-12-11 VITALS — BP 112/68 | HR 86 | Temp 98.4°F | Wt 277.0 lb

## 2017-12-11 DIAGNOSIS — Z9049 Acquired absence of other specified parts of digestive tract: Secondary | ICD-10-CM | POA: Insufficient documentation

## 2017-12-11 DIAGNOSIS — O368330 Maternal care for abnormalities of the fetal heart rate or rhythm, third trimester, not applicable or unspecified: Secondary | ICD-10-CM | POA: Insufficient documentation

## 2017-12-11 DIAGNOSIS — Z9889 Other specified postprocedural states: Secondary | ICD-10-CM | POA: Insufficient documentation

## 2017-12-11 DIAGNOSIS — O368390 Maternal care for abnormalities of the fetal heart rate or rhythm, unspecified trimester, not applicable or unspecified: Secondary | ICD-10-CM

## 2017-12-11 DIAGNOSIS — Z3493 Encounter for supervision of normal pregnancy, unspecified, third trimester: Secondary | ICD-10-CM

## 2017-12-11 DIAGNOSIS — Z3689 Encounter for other specified antenatal screening: Secondary | ICD-10-CM

## 2017-12-11 DIAGNOSIS — Z3A39 39 weeks gestation of pregnancy: Secondary | ICD-10-CM | POA: Insufficient documentation

## 2017-12-11 MED ORDER — DIPHENHYDRAMINE HCL 50 MG PO TABS: 50.0000 mg | ORAL_TABLET | Freq: Three times a day (TID) | ORAL | 0 refills | Status: DC | PRN

## 2017-12-11 NOTE — Progress Notes (Signed)
ARIAHNA SMIDDY is a 37 y.o. [id number] at [redacted]w[redacted]d here for routine follow up.  She reports baby is moving, no vaginal moving, no discharge. No contractions. No abdominal pain, no headaches, no full body itching. See flow sheet for details.  PE: Gen: NAD, comfortable in exam room, vitals reviewed Abd: Gravid abdomen, Fundal Height 41.5cm, FHR: 160  A/P: Pregnancy at [redacted]w[redacted]d. Doing well.   Pregnancy issues include lower leg edema but no pain. Mild red itchy rash on right leg only. No whole body itching.  Infant feeding choice: Bottle feeding Contraception choice: unsure Infant circumcision desired: not applicable  GBS and gc/chlamydia testing results were reviewed today. GBS Negative. Labor and fetal movement precautions reviewed. Benadryl resent to pharmacy.  Patient sent to [facility] to have NST performed given heart rate was higher end of normal and she is going to be post dates starting tomorrow. Schedule for induction at 41 weeks to be admitted on Nov. 6th Follow up 1 week.   Jules Schick, DO [facility], PGY-2

## 2017-12-11 NOTE — MAU Provider Note (Signed)
  History     CSN: [id number]  Arrival date and time: 12/11/17 1755   First Provider Initiated Contact with Patient 12/11/17 1855      Chief Complaint  Patient presents with  . Non-stress Test   HPI Bonnie Mcneil is a 37 y.o. [id number] at [redacted]w[redacted]d who presents from [facility] for an NST. She states they told her the baby's heart rate was too high and she needed monitoring. She states the FHR was 160 bpm. She denies any vaginal bleeding, pain or discharge. Reports normal fetal movement.   OB History    Gravida  6   Para  4   Term  4   Preterm  0   AB  1   Living  4     SAB  0   TAB  1   Ectopic  0   Multiple  0   Live Births  4           Past Medical History:  Diagnosis Date  . Chlamydia 2004  . Cholecystitis 2008  . Intrauterine pregnancy 2004, 2008  . Obesity 06/11/2016  . Therapeutic abortion in first trimester 2008   secondary to concern for baby during cholecystecomy    Past Surgical History:  Procedure Laterality Date  . CHOLECYSTECTOMY  2008    Family History  Problem Relation Age of Onset  . Hypotension Mother   . Cancer Maternal Grandmother   . Cancer Paternal Grandmother     Social History   Tobacco Use  . Smoking status: Never Smoker  . Smokeless tobacco: Never Used  Substance Use Topics  . Alcohol use: No  . Drug use: No    Allergies: No Known Allergies  Medications Prior to Admission  Medication Sig Dispense Refill Last Dose  . diphenhydrAMINE (BENADRYL) 50 MG tablet Take 1 tablet (50 mg total) by mouth every 8 (eight) hours as needed for allergies. 30 tablet 0     Review of Systems  Eyes: Negative for visual disturbance.  Gastrointestinal: Negative for abdominal pain.  Genitourinary: Negative for vaginal bleeding and vaginal discharge.  Neurological: Negative for headaches.   Physical Exam   Blood pressure 122/66, pulse (!) 108, temperature 98.5 F (36.9 C), resp. rate 18, height 5\' 7"  (1.702 m), weight 125.6 kg, last  menstrual period 03/07/2017.  Physical Exam  Nursing note and vitals reviewed. Constitutional: She is oriented to person, place, and time. She appears well-developed and well-nourished. No distress.  HENT:  Head: Normocephalic.  Cardiovascular: Normal rate, regular rhythm and normal heart sounds.  Respiratory: Effort normal and breath sounds normal. No respiratory distress.  Neurological: She is alert and oriented to person, place, and time.  Skin: Skin is warm and dry.  Psychiatric: She has a normal mood and affect. Her behavior is normal. Judgment and thought content normal.   Fetal Tracing:  Baseline: 140 Variability: moderate Accels: 15x15 Decels: none  Toco: one uc  MAU Course  Procedures  MDM NST- reactive  Assessment and Plan   1. Non-stress test reactive   2. [redacted] weeks gestation of pregnancy    -Discharge home in stable condition -Fetal kick count precautions discussed -Patient advised to follow-up with [facility] as scheduled -Patient may return to MAU as needed or if her condition were to change or worsen  Rolm Bookbinder CNM 12/11/2017, 6:55 PM

## 2017-12-11 NOTE — MAU Note (Signed)
Pt sent from office since baby's HR in the 160's when they dopplered in office today. Denies any pain, cramping, bleeding or discharge. Good fetal movement felt.

## 2017-12-11 NOTE — Patient Instructions (Signed)
It was great to see you today! Thank you for letting me participate in your care!  Today, we discussed your baby's fast heart rate. It was on the upper end of normal and just to be safe I am sending you to the MAU for a fetal non-stress test. Please get this today. If all is well (I expect that it will be) then you are scheduled for induction on Nov. 6th.  Be well, Jules Schick, DO PGY-2, [facility]

## 2017-12-11 NOTE — Discharge Instructions (Signed)

## 2017-12-13 ENCOUNTER — Ambulatory Visit (INDEPENDENT_AMBULATORY_CARE_PROVIDER_SITE_OTHER): Payer: Self-pay | Admitting: *Deleted

## 2017-12-13 ENCOUNTER — Ambulatory Visit: Payer: Self-pay

## 2017-12-13 VITALS — BP 123/77 | HR 109 | Wt 275.9 lb

## 2017-12-13 DIAGNOSIS — O480 Post-term pregnancy: Secondary | ICD-10-CM

## 2017-12-13 NOTE — Progress Notes (Signed)

## 2017-12-19 ENCOUNTER — Inpatient Hospital Stay (HOSPITAL_COMMUNITY)
Admission: RE | Admit: 2017-12-19 | Discharge: 2017-12-22 | DRG: 807 | Disposition: A | Discharge status: Home / Self Care | Payer: Medicaid Other | Attending: Obstetrics & Gynecology | Admitting: Obstetrics & Gynecology

## 2017-12-19 ENCOUNTER — Encounter (HOSPITAL_COMMUNITY): Payer: Self-pay

## 2017-12-19 DIAGNOSIS — Z3A41 41 weeks gestation of pregnancy: Secondary | ICD-10-CM | POA: Diagnosis not present

## 2017-12-19 DIAGNOSIS — O480 Post-term pregnancy: Principal | ICD-10-CM | POA: Diagnosis present

## 2017-12-19 DIAGNOSIS — E669 Obesity, unspecified: Secondary | ICD-10-CM | POA: Diagnosis present

## 2017-12-19 DIAGNOSIS — O99214 Obesity complicating childbirth: Secondary | ICD-10-CM | POA: Diagnosis present

## 2017-12-19 DIAGNOSIS — O09529 Supervision of elderly multigravida, unspecified trimester: Secondary | ICD-10-CM

## 2017-12-19 LAB — TYPE AND SCREEN
ABO/RH(D): O POS
Antibody Screen: NEGATIVE

## 2017-12-19 LAB — CBC
HCT: 38.3 % (ref 36.0–46.0)
HEMOGLOBIN: 12.8 g/dL (ref 12.0–15.0)
MCH: 29.5 pg (ref 26.0–34.0)
MCHC: 33.4 g/dL (ref 30.0–36.0)
MCV: 88.2 fL (ref 80.0–100.0)
Platelets: 235 10*3/uL (ref 150–400)
RBC: 4.34 MIL/uL (ref 3.87–5.11)
RDW: 14.2 % (ref 11.5–15.5)
WBC: 10.5 10*3/uL (ref 4.0–10.5)
nRBC: 0 % (ref 0.0–0.2)

## 2017-12-19 MED ORDER — LACTATED RINGERS IV SOLN
INTRAVENOUS | Status: DC
  Administered 2017-12-19: 09:00:00 via INTRAVENOUS

## 2017-12-19 MED ORDER — MISOPROSTOL 25 MCG QUARTER TABLET
25.0000 ug | ORAL_TABLET | ORAL | Status: DC
  Administered 2017-12-19: 25 ug via VAGINAL
  Filled 2017-12-19: qty 1

## 2017-12-19 MED ORDER — FENTANYL CITRATE (PF) 100 MCG/2ML IJ SOLN: 100.0000 ug | INTRAMUSCULAR | Status: DC | PRN

## 2017-12-19 MED ORDER — OXYTOCIN BOLUS FROM INFUSION
500.0000 mL | Freq: Once | INTRAVENOUS | Status: AC
  Administered 2017-12-20: 500 mL via INTRAVENOUS

## 2017-12-19 MED ORDER — OXYTOCIN 40 UNITS IN LACTATED RINGERS INFUSION - SIMPLE MED
2.5000 [IU]/h | INTRAVENOUS | Status: DC
  Administered 2017-12-20: 2.5 [IU]/h via INTRAVENOUS

## 2017-12-19 MED ORDER — ONDANSETRON HCL 4 MG/2ML IJ SOLN: 4.0000 mg | Freq: Four times a day (QID) | INTRAMUSCULAR | Status: DC | PRN

## 2017-12-19 MED ORDER — TERBUTALINE SULFATE 1 MG/ML IJ SOLN
0.2500 mg | Freq: Once | INTRAMUSCULAR | Status: DC | PRN
  Filled 2017-12-19: qty 1

## 2017-12-19 MED ORDER — LIDOCAINE HCL (PF) 1 % IJ SOLN
30.0000 mL | INTRAMUSCULAR | Status: DC | PRN
  Filled 2017-12-19: qty 30

## 2017-12-19 MED ORDER — FLEET ENEMA 7-19 GM/118ML RE ENEM: 1.0000 | ENEMA | RECTAL | Status: DC | PRN

## 2017-12-19 MED ORDER — LACTATED RINGERS IV SOLN: 500.0000 mL | INTRAVENOUS | Status: DC | PRN

## 2017-12-19 MED ORDER — SOD CITRATE-CITRIC ACID 500-334 MG/5ML PO SOLN: 30.0000 mL | ORAL | Status: DC | PRN

## 2017-12-19 MED ORDER — MISOPROSTOL 50MCG HALF TABLET
50.0000 ug | ORAL_TABLET | ORAL | Status: DC | PRN
  Administered 2017-12-19 (×3): 50 ug via BUCCAL
  Filled 2017-12-19 (×3): qty 1

## 2017-12-19 NOTE — Progress Notes (Signed)
LABOR PROGRESS NOTE  Bonnie Mcneil is a 37 y.o. [id number] at [redacted]w[redacted]d  admitted for IOL due to postdates.   Subjective: Doing well, relaxing in bedside chair with family.   Objective: BP 132/70   Pulse 79   Temp 98.9 F (37.2 C) (Oral)   Resp 18   Ht 5\' 7"  (1.702 m)   Wt 126.6 kg   LMP 03/07/2017 (Approximate)   BMI 43.70 kg/m  or  Vitals:   12/19/17 0947 12/19/17 1249 12/19/17 1702 12/19/17 2110  BP: 125/75 127/71 132/70   Pulse: 80 88 79   Resp: 16  16 18   Temp:   98.6 F (37 C) 98.9 F (37.2 C)  TempSrc:   Oral Oral  Weight:      Height:        Dilation: 1.5 Effacement (%): 20 Cervical Position: Middle Station: -3 Presentation: Vertex Exam by:: lee Toco: every 2-3 min   Labs: Lab Results  Component Value Date   WBC 10.5 12/19/2017   HGB 12.8 12/19/2017   HCT 38.3 12/19/2017   MCV 88.2 12/19/2017   PLT 235 12/19/2017    Patient Active Problem List   Diagnosis Date Noted  . Post-dates pregnancy 12/19/2017  . Encounter for supervision of high-risk pregnancy with multigravida of advanced maternal age 38/21/2019  . Third trimester pregnancy 09/20/2017  . Environmental and seasonal allergies 12/05/2016  . Obesity 06/11/2016    Assessment / Plan: 36 y.o. Y7W2956 at [redacted]w[redacted]d here for IOL due to post-dates.   Labor: IOL with cytotec x3 (all buccal), FB still in place. Will do additional vaginal cytotec.  Fetal Wellbeing:  Cat 1 strip  Pain Control:  Considering epidural  Anticipated MOD:  NSVD   Leticia Penna, D.O. Family Medicine PGY-1  12/19/2017, 9:33 PM

## 2017-12-19 NOTE — Progress Notes (Signed)
Labor Progress Note Bonnie Mcneil is a 37 y.o. Y8M5784 at [redacted]w[redacted]d presented for IOL for post dates  S:  Comfortable, no c/o.  O:  BP 125/75   Pulse 80   Temp 98.5 F (36.9 C) (Oral)   Resp 16   Ht 5\' 7"  (1.702 m)   Wt 126.6 kg   LMP 03/07/2017 (Approximate)   BMI 43.70 kg/m  EFM: baseline 135 bpm/ mod variability/ + accels/ no decels  Toco: irregular SVE: Dilation: 1 Effacement (%): 20 Cervical Position: Middle Station: -3 Presentation: Vertex Exam by:: Liliyana Thobe,CNM  A/P: 37 y.o. [id number] [redacted]w[redacted]d  1. Labor: latent 2. FWB: Cat I 3. Pain: analgesia/anesthesia prn  S/p Cytotec x1. FB placed, tolerated well. Start Pitocin once FB out. Anticipate SVD.  Donette Larry, CNM 10:22 AM

## 2017-12-19 NOTE — Anesthesia Pain Management Evaluation Note (Signed)
  CRNA Pain Management Visit Note  Patient: Bonnie Mcneil, 37 y.o., female  "Hello I am a member of the anesthesia team at [facility]. We have an anesthesia team available at all times to provide care throughout the hospital, including epidural management and anesthesia for C-section. I don't know your plan for the delivery whether it a natural birth, water birth, IV sedation, nitrous supplementation, doula or epidural, but we want to meet your pain goals."   1.Was your pain managed to your expectations on prior hospitalizations?   Yes   2.What is your expectation for pain management during this hospitalization?     Epidural  3.How can we help you reach that goal? epidural  Record the patient's initial score and the patient's pain goal.   Pain: 0  Pain Goal: 6 [facility] wants you to be able to say your pain was always managed very well.  [name] 12/19/2017

## 2017-12-19 NOTE — Progress Notes (Signed)
OB/GYN Faculty Practice: Labor Progress Note  Subjective: Doing well. Starting to feel her contractions. Denies LOF.   Objective: BP 132/70   Pulse 79   Temp 98.6 F (37 C) (Oral)   Resp 16   Ht 5\' 7"  (1.702 m)   Wt 126.6 kg   LMP 03/07/2017 (Approximate)   BMI 43.70 kg/m  Gen: no distress, sitting comfortably in a chair Dilation: 1.5 Effacement (%): 20 Cervical Position: Middle Station: -3 Presentation: Vertex Exam by:: lee  Assessment and Plan: 37 y.o. [id number] [redacted]w[redacted]d IOL 2/2 post dates.  Labor: FB in place, cytotec x3 -- will start pitocin once FB is out  -- pain control: considering epidural once in active labor  Fetal Well-Being: Cephalic by prior exam -- Category 1 - continuous fetal monitoring  -- GBS negative  Burman Nieves, MD Family Medicine Resident 6:49 PM

## 2017-12-19 NOTE — H&P (Addendum)
LABOR AND DELIVERY ADMISSION HISTORY AND PHYSICAL NOTE  Bonnie Mcneil is a 37 y.o. female [phone] with IUP at [redacted]w[redacted]d by LMP presenting for IOL 2/2 postdates.   She reports positive fetal movement. She denies leakage of fluid or vaginal bleeding. Denies complications during prior deliveries, denies PPH.   Prenatal History/Complications: PNC at [facility] Pregnancy complications:  - AMA - Obesity affecting pregnancy  Past Medical History: Past Medical History:  Diagnosis Date  . Chlamydia 2004  . Cholecystitis 2008  . Intrauterine pregnancy 2004, 2008  . Obesity 06/11/2016  . Therapeutic abortion in first trimester 2008   secondary to concern for baby during cholecystecomy   Past Surgical History: Past Surgical History:  Procedure Laterality Date  . CHOLECYSTECTOMY  2008   Obstetrical History: OB History    Gravida  6   Para  4   Term  4   Preterm  0   AB  1   Living  4     SAB  0   TAB  1   Ectopic  0   Multiple  0   Live Births  4          Social History: Social History   Socioeconomic History  . Marital status: Divorced    Spouse name: Not on file  . Number of children: Not on file  . Years of education: Not on file  . Highest education level: Not on file  Occupational History  . Occupation: unemployed  Social Needs  . Financial resource strain: Not hard at all  . Food insecurity:    Worry: Never true    Inability: Never true  . Transportation needs:    Medical: No    Non-medical: Not on file  Tobacco Use  . Smoking status: Never Smoker  . Smokeless tobacco: Never Used  Substance and Sexual Activity  . Alcohol use: No  . Drug use: No  . Sexual activity: Not Currently    Birth control/protection: None  Lifestyle  . Physical activity:    Days per week: Not on file    Minutes per session: Not on file  . Stress: Only a little  Relationships  . Social connections:    Talks on phone: Not on file    Gets together: Not on file    Attends  religious service: Not on file    Active member of club or organization: Not on file    Attends meetings of clubs or organizations: Not on file    Relationship status: Not on file  Other Topics Concern  . Not on file  Social History Narrative   Patient lives with 2 children in [place].     Family History: Family History  Problem Relation Age of Onset  . Hypotension Mother   . Cancer Maternal Grandmother   . Cancer Paternal Grandmother    Allergies: No Known Allergies  Review of Systems  All systems reviewed and negative except as stated in HPI  Physical Exam Last menstrual period 03/07/2017. General appearance: alert, oriented, NAD Lungs: normal respiratory effort Heart: regular rate Abdomen: soft, non-tender; gravid, FH appropriate for GA Extremities: No calf swelling or tenderness Presentation: cephalic Fetal monitoring: Cat 1 Uterine activity: quiet   Prenatal labs: ABO, Rh: O/Positive/-- (06/24 1549) Antibody: Negative (06/24 1549) Rubella: 15.50 (06/24 1549) RPR: Non Reactive (08/22 1603)  HBsAg: Negative (06/24 1549)  HIV: Non Reactive (08/22 1603)  GC/Chlamydia: Negative GBS:   Negative GTT: 1hr elevated, 3hr normal Genetic screening:  Declined despite AMA Anatomy US: Normal female  Prenatal Transfer Tool  Maternal Diabetes: No Genetic Screening: Declined Maternal Ultrasounds/Referrals: Normal Fetal Ultrasounds or other Referrals:  None Maternal Substance Abuse:  No Significant Maternal Medications:  None Significant Maternal Lab Results: None  No results found for this or any previous visit (from the past 24 hour(s)).  Patient Active Problem List   Diagnosis Date Noted  . Encounter for supervision of high-risk pregnancy with multigravida of advanced maternal age 69/21/2019  . Third trimester pregnancy 09/20/2017  . Environmental and seasonal allergies 12/05/2016  . Obesity 06/11/2016    Assessment: Bonnie Mcneil is a 37 y.o. [id number] at  [redacted]w[redacted]d here for IOL 2/2 postdates  #Labor: IOL w/ cytotec +foley, then pitocin #Pain: Considering an epidural  #FWB: Cat 1 strip, cephalic by prior exam  #ID: [id number](-) #MOF: Breast #MOC: copper IUD vs pills #Circ:  NA  Bonnie Nieves, MD Family Medicine Resident, Faculty Service  12/19/2017, 1:27 AM   Midwife attestation: I have seen and examined this patient; I agree with above documentation in the resident's note.   PE: Gen: calm comfortable, NAD Resp: normal effort and rate Abd: gravid  ROS, labs, PMH reviewed  Assessment/Plan: Bonnie Mcneil is a 37 y.o. [phone] here for IOL for post dates Admit to LD Labor: latent FWB: Cat I ID: GBS neg Cytotec and FB then Pitocin  Bonnie Mcneil, CNM  12/19/2017, 10:46 AM

## 2017-12-19 NOTE — Progress Notes (Signed)
OB/GYN Faculty Practice: Labor Progress Note  Subjective: Doing well. No complaints. Eating lunch. Feeling mild contractions every 10-15 minutes.   Objective: BP 127/71   Pulse 88   Temp 98.5 F (36.9 C) (Oral)   Resp 16   Ht 5\' 7"  (1.702 m)   Wt 126.6 kg   LMP 03/07/2017 (Approximate)   BMI 43.70 kg/m  Gen: no distress Dilation: 1 Effacement (%): 20 Cervical Position: Middle Station: -3 Presentation: Vertex Exam by:: lee  Assessment and Plan: 37 y.o. [id number] [redacted]w[redacted]d here for IOL 2/2 post dates.  Labor: FB in place, cytotec x2 -- will start pitocin once FB is out  -- pain control: considering epidural once in active labor  Fetal Well-Being: Cephalic by prior exam -- Category 1 - continuous fetal monitoring  -- GBS negative  Burman Nieves, MD Family Medicine Resident 2:10 PM

## 2017-12-20 ENCOUNTER — Inpatient Hospital Stay (HOSPITAL_COMMUNITY): Payer: Medicaid Other | Admitting: Anesthesiology

## 2017-12-20 ENCOUNTER — Encounter (HOSPITAL_COMMUNITY): Payer: Self-pay

## 2017-12-20 LAB — RPR: RPR: NONREACTIVE

## 2017-12-20 MED ORDER — IBUPROFEN 600 MG PO TABS
600.0000 mg | ORAL_TABLET | Freq: Four times a day (QID) | ORAL | Status: DC
  Administered 2017-12-20 – 2017-12-22 (×8): 600 mg via ORAL
  Filled 2017-12-20 (×9): qty 1

## 2017-12-20 MED ORDER — EPHEDRINE 5 MG/ML INJ
10.0000 mg | INTRAVENOUS | Status: DC | PRN
  Filled 2017-12-20: qty 2

## 2017-12-20 MED ORDER — TERBUTALINE SULFATE 1 MG/ML IJ SOLN
0.2500 mg | Freq: Once | INTRAMUSCULAR | Status: DC | PRN
  Filled 2017-12-20: qty 1

## 2017-12-20 MED ORDER — DIPHENHYDRAMINE HCL 50 MG/ML IJ SOLN: 12.5000 mg | INTRAMUSCULAR | Status: DC | PRN

## 2017-12-20 MED ORDER — MEASLES, MUMPS & RUBELLA VAC IJ SOLR
0.5000 mL | Freq: Once | INTRAMUSCULAR | Status: DC
  Filled 2017-12-20: qty 0.5

## 2017-12-20 MED ORDER — FENTANYL 2.5 MCG/ML BUPIVACAINE 1/10 % EPIDURAL INFUSION (WH - ANES)
14.0000 mL/h | INTRAMUSCULAR | Status: DC | PRN
  Administered 2017-12-20: 14 mL/h via EPIDURAL

## 2017-12-20 MED ORDER — BENZOCAINE-MENTHOL 20-0.5 % EX AERO
1.0000 "application " | INHALATION_SPRAY | CUTANEOUS | Status: DC | PRN
  Administered 2017-12-20: 1 via TOPICAL
  Filled 2017-12-20: qty 56

## 2017-12-20 MED ORDER — SENNOSIDES-DOCUSATE SODIUM 8.6-50 MG PO TABS
2.0000 | ORAL_TABLET | ORAL | Status: DC
  Administered 2017-12-20 – 2017-12-22 (×2): 2 via ORAL
  Filled 2017-12-20 (×2): qty 2

## 2017-12-20 MED ORDER — TRANEXAMIC ACID-NACL 1000-0.7 MG/100ML-% IV SOLN
1000.0000 mg | INTRAVENOUS | Status: AC
  Administered 2017-12-20: 1000 mg via INTRAVENOUS
  Filled 2017-12-20: qty 100

## 2017-12-20 MED ORDER — PRENATAL MULTIVITAMIN CH
1.0000 | ORAL_TABLET | Freq: Every day | ORAL | Status: DC
  Administered 2017-12-20 – 2017-12-22 (×3): 1 via ORAL
  Filled 2017-12-20 (×3): qty 1

## 2017-12-20 MED ORDER — LIDOCAINE HCL (PF) 1 % IJ SOLN
INTRAMUSCULAR | Status: DC | PRN
  Administered 2017-12-20 (×2): 5 mL via EPIDURAL

## 2017-12-20 MED ORDER — PHENYLEPHRINE 40 MCG/ML (10ML) SYRINGE FOR IV PUSH (FOR BLOOD PRESSURE SUPPORT)
80.0000 ug | PREFILLED_SYRINGE | INTRAVENOUS | Status: DC | PRN
  Filled 2017-12-20: qty 5

## 2017-12-20 MED ORDER — DIPHENHYDRAMINE HCL 25 MG PO CAPS: 25.0000 mg | ORAL_CAPSULE | Freq: Four times a day (QID) | ORAL | Status: DC | PRN

## 2017-12-20 MED ORDER — WITCH HAZEL-GLYCERIN EX PADS: 1.0000 "application " | MEDICATED_PAD | CUTANEOUS | Status: DC | PRN

## 2017-12-20 MED ORDER — TETANUS-DIPHTH-ACELL PERTUSSIS 5-2.5-18.5 LF-MCG/0.5 IM SUSP: 0.5000 mL | Freq: Once | INTRAMUSCULAR | Status: DC

## 2017-12-20 MED ORDER — LACTATED RINGERS IV SOLN: 500.0000 mL | Freq: Once | INTRAVENOUS | Status: DC

## 2017-12-20 MED ORDER — ACETAMINOPHEN 325 MG PO TABS: 650.0000 mg | ORAL_TABLET | ORAL | Status: DC | PRN

## 2017-12-20 MED ORDER — FENTANYL 2.5 MCG/ML BUPIVACAINE 1/10 % EPIDURAL INFUSION (WH - ANES)
INTRAMUSCULAR | Status: AC
  Filled 2017-12-20: qty 100

## 2017-12-20 MED ORDER — DIBUCAINE 1 % RE OINT: 1.0000 "application " | TOPICAL_OINTMENT | RECTAL | Status: DC | PRN

## 2017-12-20 MED ORDER — SIMETHICONE 80 MG PO CHEW: 80.0000 mg | CHEWABLE_TABLET | ORAL | Status: DC | PRN

## 2017-12-20 MED ORDER — ONDANSETRON HCL 4 MG/2ML IJ SOLN: 4.0000 mg | INTRAMUSCULAR | Status: DC | PRN

## 2017-12-20 MED ORDER — ONDANSETRON HCL 4 MG PO TABS: 4.0000 mg | ORAL_TABLET | ORAL | Status: DC | PRN

## 2017-12-20 MED ORDER — TRANEXAMIC ACID 1000 MG/10ML IV SOLN: 2000.0000 mg | Freq: Once | INTRAVENOUS | Status: DC

## 2017-12-20 MED ORDER — PHENYLEPHRINE 40 MCG/ML (10ML) SYRINGE FOR IV PUSH (FOR BLOOD PRESSURE SUPPORT)
PREFILLED_SYRINGE | INTRAVENOUS | Status: AC
  Filled 2017-12-20: qty 10

## 2017-12-20 MED ORDER — OXYTOCIN 40 UNITS IN LACTATED RINGERS INFUSION - SIMPLE MED
1.0000 m[IU]/min | INTRAVENOUS | Status: DC
  Administered 2017-12-20: 2 m[IU]/min via INTRAVENOUS
  Filled 2017-12-20: qty 1000

## 2017-12-20 MED ORDER — COCONUT OIL OIL: 1.0000 "application " | TOPICAL_OIL | Status: DC | PRN

## 2017-12-20 NOTE — Anesthesia Procedure Notes (Signed)
Epidural Patient location during procedure: OB  Staffing Anesthesiologist: Babette Stum, MD Performed: anesthesiologist   Preanesthetic Checklist Completed: patient identified, site marked, surgical consent, pre-op evaluation, timeout performed, IV checked, risks and benefits discussed and monitors and equipment checked  Epidural Patient position: sitting Prep: DuraPrep Patient monitoring: heart rate, continuous pulse ox and blood pressure Approach: right paramedian Location: L3-L4 Injection technique: LOR saline  Needle:  Needle type: Tuohy  Needle gauge: 17 G Needle length: 9 cm and 9 Needle insertion depth: 6 cm Catheter type: closed end flexible Catheter size: 20 Guage Catheter at skin depth: 10 cm Test dose: negative  Assessment Events: blood not aspirated, injection not painful, no injection resistance, negative IV test and no paresthesia  Additional Notes Patient identified. Risks/Benefits/Options discussed with patient including but not limited to bleeding, infection, nerve damage, paralysis, failed block, incomplete pain control, headache, blood pressure changes, nausea, vomiting, reactions to medication both or allergic, itching and postpartum back pain. Confirmed with bedside nurse the patient's most recent platelet count. Confirmed with patient that they are not currently taking any anticoagulation, have any bleeding history or any family history of bleeding disorders. Patient expressed understanding and wished to proceed. All questions were answered. Sterile technique was used throughout the entire procedure. Please see nursing notes for vital signs. Test dose was given through epidural needle and negative prior to continuing to dose epidural or start infusion. Warning signs of high block given to the patient including shortness of breath, tingling/numbness in hands, complete motor block, or any concerning symptoms with instructions to call for help. Patient was given  instructions on fall risk and not to get out of bed. All questions and concerns addressed with instructions to call with any issues.     

## 2017-12-20 NOTE — Anesthesia Preprocedure Evaluation (Signed)

## 2017-12-20 NOTE — Anesthesia Postprocedure Evaluation (Signed)
Anesthesia Post Note  Patient: Bonnie Mcneil  Procedure(s) Performed: AN AD HOC LABOR EPIDURAL     Patient location during evaluation: Mother Baby Anesthesia Type: Epidural Level of consciousness: awake, oriented and awake and alert Pain management: pain level controlled Vital Signs Assessment: post-procedure vital signs reviewed and stable Respiratory status: spontaneous breathing, nonlabored ventilation and respiratory function stable Cardiovascular status: stable Postop Assessment: no headache, no backache, no apparent nausea or vomiting, adequate PO intake and able to ambulate Anesthetic complications: no    Last Vitals:  Vitals:   12/20/17 0554 12/20/17 0955  BP: 109/72 105/66  Pulse: 81 88  Resp: 18 18  Temp: 36.7 C 36.7 C  SpO2: 98% 97%    Last Pain:  Vitals:   12/20/17 0955  TempSrc: Oral  PainSc: 0-No pain   Pain Goal:                 Ignacio Lowder

## 2017-12-20 NOTE — Progress Notes (Signed)
LABOR PROGRESS NOTE  Bonnie Mcneil is a 37 y.o. [id number] at [redacted]w[redacted]d  admitted for IOL due to post-dates.   Subjective: Strip note   Objective: BP 126/67   Pulse 69   Temp 97.8 F (36.6 C) (Oral)   Resp 18   Ht 5\' 7"  (1.702 m)   Wt 126.6 kg   LMP 03/07/2017 (Approximate)   BMI 43.70 kg/m  or  Vitals:   12/19/17 2346 12/20/17 0040 12/20/17 0129 12/20/17 0202  BP: 128/71 134/81 133/74 126/67  Pulse: 89 68 80 69  Resp: 18 20 18    Temp: 97.8 F (36.6 C)     TempSrc: Oral     Weight:      Height:         Dilation: 3 Effacement (%): Thick Cervical Position: Posterior Station: -3 Presentation: Vertex Exam by:: Valeta Harms, RN FHT: baseline rate 150, moderate varibility, -acel, -decel Toco: Irritable   Labs: Lab Results  Component Value Date   WBC 10.5 12/19/2017   HGB 12.8 12/19/2017   HCT 38.3 12/19/2017   MCV 88.2 12/19/2017   PLT 235 12/19/2017    Patient Active Problem List   Diagnosis Date Noted  . Post-dates pregnancy 12/19/2017  . Encounter for supervision of high-risk pregnancy with multigravida of advanced maternal age 48/21/2019  . Third trimester pregnancy 09/20/2017  . Environmental and seasonal allergies 12/05/2016  . Obesity 06/11/2016    Assessment / Plan: 37 y.o. A5W0981 at [redacted]w[redacted]d here for IOL due to post-dates.   Labor: IOL, s/p cyto x4 and FB out @0100 . Started pit 2x2.  Fetal Wellbeing:  Reassuring, however will watch closely given no accelerations in the past 30 minutes.  Pain Control:  IV fent PRN  Anticipated MOD:  NSVD   Leticia Penna, D.O. FM PGY-1  12/20/2017, 2:07 AM

## 2017-12-21 NOTE — Progress Notes (Signed)
CSW received consult for limited PNC. CSW reviewed chart and is screening out consult as it does not meet criteria for automatic CSW involvement and infant drug screening. MOB started care prior to 28 weeks and had more than 3 visits. Please contact CSW if current concerns arise or by MOB's request.  Nyomie Ehrlich, LCSWA Clinical Social Worker [facility] Cell#: [phone] 

## 2017-12-21 NOTE — Plan of Care (Signed)
Progressing appropriately. Encouraged to call for assistance as needed.  

## 2017-12-22 ENCOUNTER — Encounter (HOSPITAL_COMMUNITY): Payer: Self-pay

## 2017-12-22 DIAGNOSIS — Z3A41 41 weeks gestation of pregnancy: Secondary | ICD-10-CM

## 2017-12-22 DIAGNOSIS — O480 Post-term pregnancy: Secondary | ICD-10-CM

## 2017-12-22 MED ORDER — IBUPROFEN 600 MG PO TABS: 600.0000 mg | ORAL_TABLET | Freq: Four times a day (QID) | ORAL | 0 refills | Status: DC | PRN

## 2017-12-22 NOTE — Discharge Summary (Signed)
Postpartum Discharge Summary     Patient Name: Bonnie Mcneil DOB: 03-26-1980 MRN: [id number]  Date of admission: 12/19/2017 Delivering Provider: Gwenevere Abbot   Date of discharge: 12/22/2017  Admitting diagnosis: 40WKS INDUCTION Intrauterine pregnancy: [redacted]w[redacted]d     Secondary diagnosis:  Active Problems:   Obesity   Encounter for supervision of high-risk pregnancy with multigravida of advanced maternal age   Post-dates pregnancy  Additional problems: none     Discharge diagnosis: Term Pregnancy Delivered                                                                                                Post partum procedures:none  Augmentation: Pitocin, Cytotec and Foley Balloon  Complications: None  Hospital course:  Induction of Labor With Vaginal Delivery   37 y.o. yo [id number] at [redacted]w[redacted]d was admitted to the hospital 12/19/2017 for induction of labor.  Indication for induction: Postdates.  Patient had an extended cx ripening phase lasting almost 24h prior to Pit being started, but then she progressed to SVD within 2 hrs. Membrane Rupture Time/Date: 3:23 AM ,12/20/2017   Intrapartum Procedures: Episiotomy: None [1]                                         Lacerations:     Patient had delivery of a Viable infant.  Information for the patient's newborn:  [name], [name] [[id number]]  Delivery Method: Vag-Spont   12/20/2017  Details of delivery can be found in separate delivery note.  Patient had a routine postpartum course. She plans on an IUD for pp contraception. Patient is discharged home 12/22/17.  Magnesium Sulfate recieved: No BMZ received: No  Physical exam  Vitals:   12/20/17 2315 12/21/17 0541 12/21/17 1532 12/21/17 2252  BP: 115/75 (!) 92/48 118/82 113/74  Pulse: 85 82 77 79  Resp: 18 18 18 16   Temp: 97.6 F (36.4 C) 98.1 F (36.7 C) 98.3 F (36.8 C) 98.5 F (36.9 C)  TempSrc: Oral Oral Oral Oral  SpO2:   98%   Weight:      Height:       General: alert  and cooperative Lochia: appropriate Uterine Fundus: firm Incision: N/A DVT Evaluation: No evidence of DVT seen on physical exam. Labs: Lab Results  Component Value Date   WBC 10.5 12/19/2017   HGB 12.8 12/19/2017   HCT 38.3 12/19/2017   MCV 88.2 12/19/2017   PLT 235 12/19/2017   CMP Latest Ref Rng & Units 03/13/2016  Glucose 65 - 99 mg/dL 92  BUN 6 - 20 mg/dL 10  Creatinine 1.30 - 8.65 mg/dL 7.84  Sodium 696 - 295 mmol/L 140  Potassium 3.5 - 5.2 mmol/L 4.5  Chloride 96 - 106 mmol/L 102  CO2 18 - 29 mmol/L 21  Calcium 8.7 - 10.2 mg/dL 9.1  Total Protein 6.0 - 8.5 g/dL 6.8  Total Bilirubin 0.0 - 1.2 mg/dL 0.5  Alkaline Phos 39 - 117 IU/L 53  AST 0 - 40 IU/L 22  ALT 0 - 32 IU/L 35(H)    Discharge instruction: per After Visit Summary and "Baby and Me Booklet".  After visit meds:  Allergies as of 12/22/2017   No Known Allergies     Medication List    STOP taking these medications   diphenhydrAMINE 50 MG tablet Commonly known as:  BENADRYL     TAKE these medications   ibuprofen 600 MG tablet Commonly known as:  ADVIL,MOTRIN Take 1 tablet (600 mg total) by mouth every 6 (six) hours as needed.   prenatal multivitamin Tabs tablet Take 1 tablet by mouth daily at 12 noon.       Diet: routine diet  Activity: Advance as tolerated. Pelvic rest for 6 weeks.   Outpatient follow up:4 weeks Follow up Appt:No future appointments. Follow up Visit: Follow-up Information    [facility]. Schedule an appointment as soon as possible for a visit in 4 week(s).   Why:  for your postpartum appointment Contact information: [address] [place] [address] [phone]          Please schedule this patient for Postpartum visit in: 4 weeks with the following provider: Any provider For C/S patients schedule nurse incision check in weeks 2 weeks: no Low risk pregnancy complicated by: none Delivery mode:  SVD Anticipated Birth Control:   IUD PP Procedures needed: none  Schedule Integrated BH visit: no     Newborn Data: Live born female  Birth Weight: 7 lb 3 oz (3260 g) APGAR: 9, 9  Newborn Delivery   Birth date/time:  12/20/2017 03:39:00 Delivery type:  Vaginal, Spontaneous     Baby Feeding: Bottle Disposition:home with mother   12/22/2017 Cam Hai, CNM  7:48 AM

## 2017-12-22 NOTE — Discharge Instructions (Signed)
Parto vaginal, cuidados posteriores  Vaginal Delivery, Care After  Siga estas instrucciones durante las prximas semanas. Estas indicaciones le proporcionan informacin acerca de cmo deber cuidarse despus del parto vaginal. Su mdico tambin podr darle indicaciones ms especficas. El tratamiento ha sido planificado segn las prcticas mdicas actuales, pero en algunos casos pueden ocurrir problemas. Llame al mdico si tiene problemas o preguntas.  Qu puedo esperar despus del procedimiento?  Despus de un parto vaginal, es frecuente tener lo siguiente:   Hemorragia leve de la vagina.   Dolor en el abdomen, la vagina y la zona de la piel entre la abertura vaginal y el ano (perineo).   Calambres plvicos.   Fatiga.    Siga estas indicaciones en su casa:  Medicamentos   Tome los medicamentos de venta libre y los recetados solamente como se lo haya indicado el mdico.   Si le recetaron un antibitico, tmelo como se lo haya indicado el mdico. No interrumpa la administracin del antibitico hasta que lo haya terminado.  Conducir     No conduzca ni opere maquinaria pesada mientras toma analgsicos recetados.   No conduzca durante 24horas si le administraron un sedante.  Estilo de vida   No beba alcohol. Esto es de suma importancia si est amamantando o toma analgsicos.   No consuma productos que contengan tabaco, incluidos cigarrillos, tabaco de mascar o cigarrillos electrnicos. Si necesita ayuda para dejar de fumar, consulte al mdico.  Qu debe comer y beber   Beba al menos 8vasos de ochoonzas (240cc) de agua todos los das a menos que el mdico le indique lo contrario. Si elige amamantar al beb, quiz deba beber an ms cantidad de agua.   Coma alimentos ricos en fibras todos los das. Estos alimentos pueden ayudarla a prevenir o aliviar el estreimiento. Los alimentos ricos en fibras incluyen, entre otros:  ? Panes y cereales integrales.  ? Arroz integral.  ? Frijoles.  ? Frutas y verduras  frescas.  Actividad   Retome sus actividades normales como se lo haya indicado el mdico. Pregntele al mdico qu actividades son seguras para usted.   Descanse todo lo que pueda. Trate de descansar o tomar una siesta mientras el beb est durmiendo.   No levante objetos que pesen ms que su beb o 10libras (4,5kg) hasta que el mdico le diga que es seguro.   Hable con el mdico sobre cundo puede retomar la actividad sexual. Esto puede depender de lo siguiente:  ? Riesgo de sufrir una infeccin.  ? Velocidad de cicatrizacin.  ? Comodidad y deseo de retomar la actividad sexual.  Cuidados vaginales   Si le realizaron una episiotoma o tuvo un desgarro vaginal, contrlese la zona todos los das para detectar signos de infeccin. Est atenta a los siguientes signos:  ? Aumento del enrojecimiento, la hinchazn o el dolor.  ? Mayor presencia de lquido o sangre.  ? Calor.  ? Pus o mal olor.   No use tampones ni se haga duchas vaginales hasta que el mdico la autorice.   Controle la sangre que elimina por la vagina para detectar cogulos de sangre. Estos pueden tener el aspecto de grumos de color rojo oscuro, o secrecin marrn o negra.  Instrucciones generales   Mantenga el perineo limpio y seco, como se lo haya indicado el mdico.   Use ropa cmoda y suelta.   Cuando vaya al bao, siempre higiencese de adelante hacia atrs.   Pregntele al mdico si puede ducharse o tomar baos de inmersin.   Si se le realiz una episiotoma o tuvo un desgarro perineal durante el trabajo del parto o el parto, es posible que el mdico le indique que no tome baos de inmersin durante un determinado tiempo.   Use un sostn que sujete y ajuste bien sus pechos.   Si es posible, pdale a alguien que la ayude con las tareas del hogar y a cuidar del beb durante al menos algunos das despus de que le den el alta del hospital.   Concurra a todas las visitas de seguimiento para usted y el beb, como se lo haya indicado el  mdico. Esto es importante.  Comunquese con un mdico si:   Tiene los siguientes sntomas:  ? Secrecin vaginal que tiene mal olor.  ? Dificultad para orinar.  ? Dolor al orinar.  ? Aumento o disminucin repentinos de la frecuencia de las deposiciones.  ? Ms enrojecimiento, hinchazn o dolor alrededor de la episiotoma o del desgarro vaginal.  ? Ms secrecin de lquido o sangre de la episiotoma o del desgarro vaginal.  ? Pus o mal olor proveniente de la episiotoma o del desgarro vaginal.  ? Fiebre.  ? Erupcin cutnea.  ? Poco inters o falta de inters en actividades que solan gustarle.  ? Dudas sobre su cuidado y el del beb.   Siente la episiotoma o el desgarro vaginal caliente al tacto.   La episiotoma o el desgarro vaginal se abren o no parecen cicatrizar.   Siente dolor en las mamas, o estn duras o enrojecidas.   Siente tristeza o preocupacin de forma inusual.   Siente nuseas o vomita.   Elimina cogulos de sangre grandes por la vagina. Si expulsa un cogulo de sangre por la vagina, gurdelo para mostrrselo a su mdico. No tire la cadena sin que el mdico examine el cogulo de sangre antes.   Orina ms de lo habitual.   Se siente mareada o se desmaya.   No ha amamantado para nada y no ha tenido un perodo menstrual durante 12 semanas despus del parto.   Dej de amamantar al beb y no ha tenido su perodo menstrual durante 12 semanas despus de dejar de amamantar.  Solicite ayuda de inmediato si:   Tiene los siguientes sntomas:  ? Dolor que no desaparece o no mejora con medicamentos.  ? Dolor en el pecho.  ? Dificultad para respirar.  ? Visin borrosa o manchas en la vista.  ? Pensamientos de autolesionarse o lesionar al beb.   Comienza a sentir dolor en el abdomen o en una de las piernas.   Presenta un dolor de cabeza intenso.   Se desmaya.   Tiene una hemorragia de la vagina tan intensa que empapa dos toallitas sanitarias en una hora.  Esta informacin no tiene como fin  reemplazar el consejo del mdico. Asegrese de hacerle al mdico cualquier pregunta que tenga.  Document Released: 01/30/2005 Document Revised: 05/24/2016 Document Reviewed: 02/14/2015  Elsevier Interactive Patient Education  2018 [employer] Inc[employer]

## 2018-01-31 ENCOUNTER — Encounter: Payer: Self-pay | Admitting: Family Medicine

## 2018-01-31 ENCOUNTER — Ambulatory Visit (INDEPENDENT_AMBULATORY_CARE_PROVIDER_SITE_OTHER): Payer: Medicaid Other | Admitting: Family Medicine

## 2018-01-31 ENCOUNTER — Other Ambulatory Visit: Payer: Self-pay

## 2018-01-31 VITALS — BP 102/60 | HR 74 | Temp 98.6°F | Ht 67.0 in | Wt 259.0 lb

## 2018-01-31 DIAGNOSIS — Z3202 Encounter for pregnancy test, result negative: Secondary | ICD-10-CM | POA: Diagnosis present

## 2018-01-31 DIAGNOSIS — Z30013 Encounter for initial prescription of injectable contraceptive: Secondary | ICD-10-CM | POA: Diagnosis not present

## 2018-01-31 DIAGNOSIS — Z3200 Encounter for pregnancy test, result unknown: Secondary | ICD-10-CM

## 2018-01-31 LAB — POCT URINE PREGNANCY: Preg Test, Ur: NEGATIVE

## 2018-01-31 MED ORDER — MEDROXYPROGESTERONE ACETATE 150 MG/ML IM SUSP: 150.0000 mg | Freq: Once | INTRAMUSCULAR | Status: DC

## 2018-01-31 NOTE — Patient Instructions (Signed)
Please come back in 1 -2 weeks with Dr. Darin EngelsAbraham to get the IUD if that is what you decide.    Intrauterine Device Information An intrauterine device (IUD) is a medical device that is inserted in the uterus to prevent pregnancy. It is a small, T-shaped device that has one or two nylon strings hanging down from it. The strings hang out of the lower part of the uterus (cervix) to allow for future IUD removal. There are two types of IUDs available:  Hormone IUD. This type of IUD is made of plastic and contains the hormone progestin (synthetic progesterone). A hormone IUD may last 3-5 years.  Copper IUD. This type of IUD has copper wire wrapped around it. A copper IUD may last up to 10 years. How is an IUD inserted? An IUD is inserted through the vagina and placed into the uterus with a minor medical procedure. The exact procedure for IUD insertion may vary among health care providers and hospitals. How does an IUD work? Synthetic progesterone in a hormonal IUD prevents pregnancy by:  Thickening cervical mucus to prevent sperm from entering the uterus.  Thinning the uterine lining to prevent a fertilized egg from being implanted there. Copper in a copper IUD prevents pregnancy by making the uterus and fallopian tubes produce a fluid that kills sperm. What are the advantages of an IUD? Advantages of either type of IUD  It is highly effective in preventing pregnancy.  It is reversible. You can become pregnant shortly after the IUD is removed.  It is low-maintenance and can stay in place for a long time.  There are no estrogen-related side effects.  It can be used when breastfeeding.  It is not associated with weight gain.  It can be inserted right after childbirth, an abortion, or a miscarriage. Advantages of a hormone IUD  If it is inserted within 7 days of your period starting, it works right after it is inserted. If the hormone IUD is inserted at any other time in your cycle, you will  need to use a backup method of birth control for 7 days after insertion.  It can make menstrual periods lighter.  It can reduce menstrual cramping.  It can be used for 3-5 years. Advantages of a copper IUD  It works right after it is inserted.  It can be used as a form of emergency birth control if it is inserted within 5 days after having unprotected sex.  It does not interfere with your body's natural hormones.  It can be used for 10 years. What are the disadvantages of an IUD?  An IUD may cause irregular menstrual bleeding for a period of time after insertion.  You may have pain during insertion and have cramping and vaginal bleeding after insertion.  An IUD may cut the uterus (uterine perforation) when it is inserted. This is rare.  An IUD may cause pelvic inflammatory disease (PID), which is an infection in the uterus and fallopian tubes. This is rare, and it usually happens during the first 20 days after the IUD is inserted.  A copper IUD can make your menstrual flow heavier and more painful. How is an IUD removed?  You will lie on your back with your knees bent and your feet in footrests (stirrups).  A device will be inserted into your vagina to spread apart the vaginal walls (speculum). This will allow your health care provider to see the strings attached to the IUD.  Your health care provider will  use a small instrument (forceps) to grasp the IUD strings and pull firmly until the IUD is removed. You may have some discomfort when the IUD is removed. Your health care provider may recommend taking over-the-counter pain relievers, such as ibuprofen, before the procedure. You may also have minor spotting for a few days after the procedure. The exact procedure for IUD removal may vary among health care providers and hospitals. Is the IUD right for me? Your health care provider will make sure you are a good candidate for an IUD and will discuss the advantages, disadvantages,  and possible side effects with you. Summary  An intrauterine device (IUD) is a medical device that is inserted in the uterus to prevent pregnancy. It is a small, T-shaped device that has one or two nylon strings hanging down from it.  A hormone IUD contains the hormone progestin (synthetic progesterone). A copper IUD has copper wire wrapped around it.  Synthetic progesterone in a hormone IUD prevents pregnancy by thickening cervical mucus and thinning the walls of the uterus. Copper in a copper IUD prevents pregnancy by making the uterus and fallopian tubes produce a fluid that kills sperm.  A hormone IUD can be left in place for 3-5 years. A copper IUD can be left in place for up to 10 years.  An IUD is inserted and removed by a health care provider. You may feel some pain during insertion and removal. Your health care provider may recommend taking over-the-counter pain medicine, such as ibuprofen, before an IUD procedure. This information is not intended to replace advice given to you by your health care provider. Make sure you discuss any questions you have with your health care provider.   Contraception Choices Contraception, also called birth control, means things to use or ways to try not to get pregnant. Hormonal birth control This kind of birth control uses hormones. Here are some types of hormonal birth control:  A tube that is put under skin of the arm (implant). The tube can stay in for as long as 3 years.  Shots to get every 3 months (injections).  Pills to take every day (birth control pills).  A patch to change 1 time each week for 3 weeks (birth control patch). After that, the patch is taken off for 1 week.  A ring to put in the vagina. The ring is left in for 3 weeks. Then it is taken out of the vagina for 1 week. Then a new ring is put in.  Pills to take after unprotected sex (emergency birth control pills). Barrier birth control Here are some types of barrier birth  control:  A thin covering that is put on the penis before sex (female condom). The covering is thrown away after sex.  A soft, loose covering that is put in the vagina before sex (female condom). The covering is thrown away after sex.  A rubber bowl that sits over the cervix (diaphragm). The bowl must be made for you. The bowl is put into the vagina before sex. The bowl is left in for 6-8 hours after sex. It is taken out within 24 hours.  A small, soft cup that fits over the cervix (cervical cap). The cup must be made for you. The cup can be left in for 6-8 hours after sex. It is taken out within 48 hours.  A sponge that is put into the vagina before sex. It must be left in for at least 6 hours after sex. It  must be taken out within 30 hours. Then it is thrown away.  A chemical that kills or stops sperm from getting into the uterus (spermicide). It may be a pill, cream, jelly, or foam to put in the vagina. The chemical should be used at least 10-15 minutes before sex. IUD (intrauterine) birth control An IUD is a small, T-shaped piece of plastic. It is put inside the uterus. There are two kinds:  Hormone IUD. This kind can stay in for 3-5 years.  Copper IUD. This kind can stay in for 10 years. Permanent birth control Here are some types of permanent birth control:  Surgery to block the fallopian tubes.  Having an insert put into each fallopian tube.  Surgery to tie off the tubes that carry sperm (vasectomy). Natural planning birth control Here are some types of natural planning birth control:  Not having sex on the days the woman could get pregnant.  Using a calendar: ? To keep track of the length of each period. ? To find out what days pregnancy can happen. ? To plan to not have sex on days when pregnancy can happen.  Watching for symptoms of ovulation and not having sex during ovulation. One way the woman can check for ovulation is to check her temperature.  Waiting to have sex  until after ovulation. Summary  Contraception, also called birth control, means things to use or ways to try not to get pregnant.  Hormonal methods of birth control include implants, injections, pills, patches, vaginal rings, and emergency birth control pills.  Barrier methods of birth control can include female condoms, female condoms, diaphragms, cervical caps, sponges, and spermicides.  There are two types of IUD (intrauterine device) birth control. An IUD can be put in a woman's uterus to prevent pregnancy for 3-5 years.  Permanent sterilization can be done through a procedure for males, females, or both.  Natural planning methods involve not having sex on the days when the woman could get pregnant. This information is not intended to replace advice given to you by your health care provider. Make sure you discuss any questions you have with your health care provider. Document Released: 11/27/2008 Document Revised: 09/06/2017 Document Reviewed: 02/10/2016 Elsevier Interactive Patient Education  2019 Elsevier Inc.    Document Released: 01/04/2004 Document Revised: 02/29/2016 Document Reviewed: 02/29/2016 Elsevier Interactive Patient Education  2019 [employer].

## 2018-01-31 NOTE — Progress Notes (Signed)
   CC: postpartum checkup  HPI  Breastfeeding - only occaionally per day, offered help, she would not like any help right now.   No vaginal discharge, no longer having lochia.   Birth control - notes state IUD as preference, but patient cannot enumerate options. States she previously had 2 unintended pregnancies on OCPs.  She also previously had a Nexplanon, which she did not like as she thought that not having periods caused her to gain weight.  When I explained the benefits of Mirena including decreased or absent menses, patient states she is hesitant not to have periods at all.  ROS: Denies CP, SOB, abdominal pain, dysuria, changes in BMs.   CC, SH/smoking status, and VS noted  Objective: BP 102/60   Pulse 74   Temp 98.6 F (37 C) (Oral)   Ht 5\' 7"  (1.702 m)   Wt 259 lb (117.5 kg)   SpO2 97%   BMI 40.57 kg/m  Gen: NAD, alert, cooperative, and pleasant. HEENT: NCAT, EOMI, PERRL CV: RRR, no murmur Resp: CTAB, no wheezes, non-labored Abd: SNTND, BS present, no guarding or organomegaly Neuro: Alert and oriented, Speech clear, No gross deficits  Assessment and plan:  Breast-feeding: Offered help, patient denies.  Birth control: Counseled extensively on various options of birth control.  Although patient previously noted she wanted an IUD, she is quite unsure today.  She wanted to consider copper IUD because this would likely not affect her menses.  She really wants to have regular periods for her personal reassurance given unintended pregnancies on previous birth control (OCPs). She was particularly hesistant about the possiblity of decreased or absent periods with Mirena after 6 months. We do not have copper IUD in our office.  She started to say that she would just take the Mirena, however I counseled her that we should really not proceed with a procedure about which she is not sure.  Thus, we elected to do 1 Depo shot today until she can think more about her options and be sure.   If she would like to try the progesterone IUD, she can schedule with us for this anytime in the next week or as convenient for her. I do think Mirena would be a good option for her, but would like her to be certain this is what she wants. She thinks she wants to come back in the next week or 2 for the mirena, thus we deferred pelvic exam.   Orders Placed This Encounter  Procedures  . POCT urine pregnancy    Meds ordered this encounter  Medications  . medroxyPROGESTERone (DEPO-PROVERA) injection 150 mg    Loni MuseKate Zakery Normington, MD, PGY3 02/01/2018 9:52 AM

## 2019-08-06 ENCOUNTER — Encounter (HOSPITAL_COMMUNITY): Payer: Self-pay | Admitting: Emergency Medicine

## 2019-08-06 ENCOUNTER — Other Ambulatory Visit: Payer: Self-pay

## 2019-08-06 ENCOUNTER — Ambulatory Visit (HOSPITAL_COMMUNITY)
Admission: EM | Admit: 2019-08-06 | Discharge: 2019-08-06 | Disposition: A | Discharge status: Home / Self Care | Payer: Self-pay | Attending: Internal Medicine | Admitting: Internal Medicine

## 2019-08-06 DIAGNOSIS — N926 Irregular menstruation, unspecified: Secondary | ICD-10-CM

## 2019-08-06 DIAGNOSIS — N938 Other specified abnormal uterine and vaginal bleeding: Secondary | ICD-10-CM | POA: Insufficient documentation

## 2019-08-06 LAB — CBC WITH DIFFERENTIAL/PLATELET
Abs Immature Granulocytes: 0.04 10*3/uL (ref 0.00–0.07)
Basophils Absolute: 0.1 10*3/uL (ref 0.0–0.1)
Basophils Relative: 1 %
Eosinophils Absolute: 0.3 10*3/uL (ref 0.0–0.5)
Eosinophils Relative: 4 %
HCT: 40.6 % (ref 36.0–46.0)
Hemoglobin: 13.5 g/dL (ref 12.0–15.0)
Immature Granulocytes: 0 %
Lymphocytes Relative: 19 %
Lymphs Abs: 1.8 10*3/uL (ref 0.7–4.0)
MCH: 29.1 pg (ref 26.0–34.0)
MCHC: 33.3 g/dL (ref 30.0–36.0)
MCV: 87.5 fL (ref 80.0–100.0)
Monocytes Absolute: 0.7 10*3/uL (ref 0.1–1.0)
Monocytes Relative: 7 %
Neutro Abs: 6.3 10*3/uL (ref 1.7–7.7)
Neutrophils Relative %: 69 %
Platelets: 288 10*3/uL (ref 150–400)
RBC: 4.64 MIL/uL (ref 3.87–5.11)
RDW: 12.7 % (ref 11.5–15.5)
WBC: 9.1 10*3/uL (ref 4.0–10.5)
nRBC: 0 % (ref 0.0–0.2)

## 2019-08-06 LAB — BASIC METABOLIC PANEL
Anion gap: 10 (ref 5–15)
BUN: 15 mg/dL (ref 6–20)
CO2: 21 mmol/L — ABNORMAL LOW (ref 22–32)
Calcium: 9.7 mg/dL (ref 8.9–10.3)
Chloride: 106 mmol/L (ref 98–111)
Creatinine, Ser: 0.54 mg/dL (ref 0.44–1.00)
GFR calc Af Amer: >60 mL/min (ref >60)
GFR calc non Af Amer: >60 mL/min (ref >60)
Glucose, Bld: 101 mg/dL — ABNORMAL HIGH (ref 70–99)
Potassium: 4.1 mmol/L (ref 3.5–5.1)
Sodium: 137 mmol/L (ref 135–145)

## 2019-08-06 LAB — POC URINE PREG, ED: Preg Test, Ur: NEGATIVE

## 2019-08-06 LAB — TSH: TSH: 1.333 u[IU]/mL (ref 0.350–4.500)

## 2019-08-06 NOTE — ED Provider Notes (Addendum)
[place]    CSN: [id number] Arrival date & time: 08/06/19  4166      History   Chief Complaint Chief Complaint  Patient presents with   irreglar period    HPI Bonnie Mcneil is a 39 y.o. female comes to urgent care with complaints of irregular menstrual periods over the past couple of months.  Patient has a remote history of irregular menstrual cycles.  Her menstrual cycles have been regular over the past several months up until 2 months ago.  No abdominal pain or bloating.  Patient is sexually active.  Patient complains of increased weight gain over the past year.  She denies any cold intolerance.  No swelling in the neck or difficulty swallowing.  HPI  Past Medical History:  Diagnosis Date   Chlamydia 2004   Cholecystitis 2008   Intrauterine pregnancy 2004, 2008   Obesity 06/11/2016   Therapeutic abortion in first trimester 2008   secondary to concern for baby during cholecystecomy    Patient Active Problem List   Diagnosis Date Noted   Post-dates pregnancy 12/19/2017   Encounter for supervision of high-risk pregnancy with multigravida of advanced maternal age 32/21/2019   Third trimester pregnancy 09/20/2017   Environmental and seasonal allergies 12/05/2016   Obesity 06/11/2016    Past Surgical History:  Procedure Laterality Date   CHOLECYSTECTOMY  2008    OB History    Gravida  6   Para  5   Term  5   Preterm  0   AB  1   Living  5     SAB  0   TAB  1   Ectopic  0   Multiple  0   Live Births  5            Home Medications    Prior to Admission medications   Not on File    Family History Family History  Problem Relation Age of Onset   Hypotension Mother    Cancer Maternal Grandmother    Cancer Paternal Grandmother     Social History Social History   Tobacco Use   Smoking status: Never Smoker   Smokeless tobacco: Never Used  Scientific laboratory technician Use: Never used  Substance Use Topics    Alcohol use: No   Drug use: No     Allergies   Patient has no known allergies.   Review of Systems Review of Systems  Constitutional: Positive for unexpected weight change.  HENT: Negative.   Cardiovascular: Negative.   Gastrointestinal: Negative.   Genitourinary: Positive for menstrual problem. Negative for dysuria, frequency, hematuria, pelvic pain and vaginal discharge.  Musculoskeletal: Negative for arthralgias.     Physical Exam Triage Vital Signs ED Triage Vitals  Enc Vitals Group     BP 08/06/19 0838 124/84     Pulse Rate 08/06/19 0838 75     Resp 08/06/19 0838 13     Temp 08/06/19 0838 98.3 F (36.8 C)     Temp src --      SpO2 08/06/19 0838 99 %     Weight --      Height --      Head Circumference --      Peak Flow --      Pain Score 08/06/19 0837 0     Pain Loc --      Pain Edu? --      Excl. in [place]? --    No data found.  Updated Vital Signs BP 124/84 (BP Location: Left Arm)    Pulse 75    Temp 98.3 F (36.8 C)    Resp 13    SpO2 99%    Breastfeeding No   Visual Acuity Right Eye Distance:   Left Eye Distance:   Bilateral Distance:    Right Eye Near:   Left Eye Near:    Bilateral Near:     Physical Exam Vitals and nursing note reviewed.  Constitutional:      General: She is not in acute distress.    Appearance: She is not ill-appearing.     Comments: Obese  Cardiovascular:     Rate and Rhythm: Normal rate and regular rhythm.     Pulses: Normal pulses.     Heart sounds: Normal heart sounds.  Pulmonary:     Effort: Pulmonary effort is normal.     Breath sounds: Normal breath sounds.  Abdominal:     General: Bowel sounds are normal.     Palpations: Abdomen is soft.  Musculoskeletal:        General: Normal range of motion.  Skin:    General: Skin is warm.     Capillary Refill: Capillary refill takes less than 2 seconds.  Neurological:     Mental Status: She is alert.      UC Treatments / Results  Labs (all labs ordered are  listed, but only abnormal results are displayed) Labs Reviewed  BASIC METABOLIC PANEL - Abnormal; Notable for the following components:      Result Value   CO2 21 (*)    Glucose, Bld 101 (*)    All other components within normal limits  TSH  CBC WITH DIFFERENTIAL/PLATELET  POC URINE PREG, ED    EKG   Radiology No results found.  Procedures Procedures (including critical care time)  Medications Ordered in UC Medications - No data to display  Initial Impression / Assessment and Plan / UC Course  I have reviewed the triage vital signs and the nursing notes.  Pertinent labs & imaging results that were available during my care of the patient were reviewed by me and considered in my medical decision making (see chart for details).     1.  Dysfunctional uterine bleeding: CBC, BMP, TSH Urine pregnancy test is negative Patient is advised to follow-up with her GYN provider. Patient may need pelvic ultrasound to evaluate the uterus and the endometrial lining. Return precautions given. Final Clinical Impressions(s) / UC Diagnoses   Final diagnoses:  Dysfunctional uterine bleeding   Discharge Instructions   None    ED Prescriptions    None     PDMP not reviewed this encounter.   Merrilee Jansky, MD 08/06/19 1159    Merrilee Jansky, MD 08/06/19 1200

## 2019-08-06 NOTE — ED Triage Notes (Signed)
Pt c/o no period for 2 months now. Pt states she has been taking pregnancy test at home but they have been negative. She states the month before last her period was normal. Pt states she spotted a little last month and had cramping but nothing else. Pt states she has not taken any birth control in the last year.

## 2019-10-03 ENCOUNTER — Encounter (HOSPITAL_COMMUNITY): Payer: Self-pay | Admitting: Pediatrics

## 2019-10-03 ENCOUNTER — Other Ambulatory Visit: Payer: Self-pay

## 2019-10-03 ENCOUNTER — Emergency Department (HOSPITAL_COMMUNITY)
Admission: EM | Admit: 2019-10-03 | Discharge: 2019-10-03 | Disposition: A | Discharge status: Home / Self Care | Payer: Self-pay | Attending: Emergency Medicine | Admitting: Emergency Medicine

## 2019-10-03 ENCOUNTER — Emergency Department (HOSPITAL_COMMUNITY): Payer: Self-pay

## 2019-10-03 DIAGNOSIS — S93601A Unspecified sprain of right foot, initial encounter: Secondary | ICD-10-CM | POA: Insufficient documentation

## 2019-10-03 DIAGNOSIS — Y999 Unspecified external cause status: Secondary | ICD-10-CM | POA: Insufficient documentation

## 2019-10-03 DIAGNOSIS — R52 Pain, unspecified: Secondary | ICD-10-CM

## 2019-10-03 DIAGNOSIS — Y30XXXA Falling, jumping or pushed from a high place, undetermined intent, initial encounter: Secondary | ICD-10-CM | POA: Insufficient documentation

## 2019-10-03 DIAGNOSIS — Y9289 Other specified places as the place of occurrence of the external cause: Secondary | ICD-10-CM | POA: Insufficient documentation

## 2019-10-03 DIAGNOSIS — Y9389 Activity, other specified: Secondary | ICD-10-CM | POA: Insufficient documentation

## 2019-10-03 MED ORDER — IBUPROFEN 400 MG PO TABS
400.0000 mg | ORAL_TABLET | Freq: Once | ORAL | Status: AC | PRN
  Administered 2019-10-03: 400 mg via ORAL
  Filled 2019-10-03: qty 1

## 2019-10-03 NOTE — ED Triage Notes (Signed)
C/O right foot pain since last night.

## 2019-10-03 NOTE — ED Notes (Signed)
Pt requested ace wrap

## 2019-10-03 NOTE — ED Provider Notes (Signed)
[facility] EMERGENCY DEPARTMENT Provider Note   CSN: [id number] Arrival date & time: 10/03/19  3419     History Chief Complaint  Patient presents with  . Foot Pain    Bonnie Mcneil is a 39 y.o. female.  HPI   Patient with a significant medical history of cholecystitis, obesity, abortion first trimester presents to the emergency department with chief complaint of right foot pain that started last night.  Patient explains while she was outside a frog jumped up on her left foot scared her and she shot her right foot up.  She explains since then she has felt some pain on the bottom side of her right foot and feels like she has to walk on her toes because it hurts.  She denies hitting her head, losing conscious, or being on anticoags.  She denies alleviating or aggravating factors.  She denies headache, fever, chills, shortness of breath, chest pain, abdominal pain, dysuria, increased pedal edema.  Past Medical History:  Diagnosis Date  . Chlamydia 2004  . Cholecystitis 2008  . Intrauterine pregnancy 2004, 2008  . Obesity 06/11/2016  . Therapeutic abortion in first trimester 2008   secondary to concern for baby during cholecystecomy    Patient Active Problem List   Diagnosis Date Noted  . Post-dates pregnancy 12/19/2017  . Encounter for supervision of high-risk pregnancy with multigravida of advanced maternal age 60/21/2019  . Third trimester pregnancy 09/20/2017  . Environmental and seasonal allergies 12/05/2016  . Obesity 06/11/2016    Past Surgical History:  Procedure Laterality Date  . CHOLECYSTECTOMY  2008     OB History    Gravida  6   Para  5   Term  5   Preterm  0   AB  1   Living  5     SAB  0   TAB  1   Ectopic  0   Multiple  0   Live Births  5           Family History  Problem Relation Age of Onset  . Hypotension Mother   . Cancer Maternal Grandmother   . Cancer Paternal Grandmother     Social History    Tobacco Use  . Smoking status: Never Smoker  . Smokeless tobacco: Never Used  Vaping Use  . Vaping Use: Never used  Substance Use Topics  . Alcohol use: No  . Drug use: No    Home Medications Prior to Admission medications   Not on File    Allergies    Patient has no known allergies.  Review of Systems   Review of Systems  Constitutional: Negative for chills and fever.  HENT: Negative for congestion.   Respiratory: Negative for shortness of breath.   Cardiovascular: Negative for chest pain.  Gastrointestinal: Negative for abdominal pain, diarrhea, nausea and vomiting.  Genitourinary: Negative for dysuria, enuresis and flank pain.  Musculoskeletal: Negative for back pain.       Admits to right foot pain.  Skin: Negative for rash.  Neurological: Negative for dizziness and headaches.  Hematological: Does not bruise/bleed easily.    Physical Exam Updated Vital Signs BP 107/72 (BP Location: Left Arm)   Pulse 68   Temp 99 F (37.2 C) (Oral)   Resp 16   Ht 5\' 7"  (1.702 m)   Wt 117.9 kg   SpO2 100%   BMI 40.72 kg/m   Physical Exam Vitals and nursing note reviewed.  Constitutional:  General: She is not in acute distress.    Appearance: Normal appearance. She is not ill-appearing or diaphoretic.  HENT:     Head: Normocephalic and atraumatic.     Nose: No congestion or rhinorrhea.  Eyes:     General: No scleral icterus.       Right eye: No discharge.        Left eye: No discharge.     Conjunctiva/sclera: Conjunctivae normal.  Pulmonary:     Effort: Pulmonary effort is normal. No respiratory distress.     Breath sounds: Normal breath sounds. No wheezing.  Musculoskeletal:        General: Swelling and tenderness present. No deformity or signs of injury.     Cervical back: Neck supple.     Right lower leg: No edema.     Left lower leg: No edema.     Comments: Patient's right foot was visualized, slight swelling noted on the medial dorsal side of her foot,  no other gross abnormalities noted.  Patient was able to plantar flex and dorsiflex her foot.  Achilles tendon was palpated no defects noted, Thompson's test was performed and foot was plantar flex without difficulty.  Neurovascular fully intact.  Skin:    General: Skin is warm and dry.     Coloration: Skin is not jaundiced or pale.  Neurological:     Mental Status: She is alert and oriented to person, place, and time.  Psychiatric:        Mood and Affect: Mood normal.     ED Results / Procedures / Treatments   Labs (all labs ordered are listed, but only abnormal results are displayed) Labs Reviewed - No data to display  EKG None  Radiology DG Ankle Complete Right  Result Date: 10/03/2019 CLINICAL DATA:  Foot pain.  Unable to bear weight. EXAM: RIGHT ANKLE - COMPLETE 3+ VIEW COMPARISON:  None. FINDINGS: There is no evidence of fracture, dislocation, or large joint effusion. There is no evidence of arthropathy or other focal bone abnormality. Soft tissues of the ankkle are unremarkable. Plantar calcaneal enthesophyte. IMPRESSION: Negative. Electronically Signed   By: Feliberto Harts MD   On: 10/03/2019 09:03   DG Foot Complete Right  Result Date: 10/03/2019 CLINICAL DATA:  Right foot pain. Unable to bear weight. Stepped on something last night. EXAM: RIGHT FOOT COMPLETE - 3+ VIEW COMPARISON:  None. FINDINGS: There is no evidence of fracture or dislocation. Small calcaneal enthesophytes.  Mild first MTP degenerative change. Soft tissue swelling about the medial and plantar foot. No radiopaque foreign body. IMPRESSION: 1. No acute fracture or malalignment. 2. Soft tissue swelling about the medial and plantar foot. No radiopaque foreign body. Electronically Signed   By: Feliberto Harts MD   On: 10/03/2019 09:02    Procedures Procedures (including critical care time)  Medications Ordered in ED Medications  ibuprofen (ADVIL) tablet 400 mg (400 mg Oral Given 10/03/19 0840)    ED  Course  I have reviewed the triage vital signs and the nursing notes.  Pertinent labs & imaging results that were available during my care of the patient were reviewed by me and considered in my medical decision making (see chart for details).    MDM Rules/Calculators/A&P                          I have personally reviewed all imaging, labs and have interpreted them.  Patient presents with right foot pain.  She was alert and oriented did not appear to be in acute distress, vital signs reassuring.  On exam patient was tender to palpation on the dorsal side of her right foot by her calcaneus.  Achilles tendon was palpated no defects noted, she was able to dorsi and plantarflex her foot, neurovascular fully intact, no other gross abnormalities noted.  Will order imaging for further evaluation.  Ankle x-ray was negative, foot x-ray showed no acute abnormalities some swelling noted around the mid foot.  I have low suspicion for compartment syndrome as neurovascular is fully intact, foot was soft to the touch.  Low suspicion for Achilles tendon rupture as patient was able to plantar flex and dorsiflex her foot, no defects noted at the insertion site of the Achilles tendon.  Low suspicion for ligament or tendon damage as patient was able to articulate her foot without difficulty.  Likely patient has some inflammation along the plantar fascia.  Due to well-appearing patient further lab work and imaging were not warranted.  Patient appears to be resting calmly bed show no acute signs stress.  Vital signs remained stable does not meet criteria to be admitted to the hospital.  Likely patient suffered some inflammation around the plantar fascia will recommend NSAIDs as well as exercising the foot and applying ice to the area.  Patient was discussed with attending who agrees assessment plan.  Patient was given at home care as well strict return precautions.  Patient verbalized that she understood and agreed with  said plan. Final Clinical Impression(s) / ED Diagnoses Final diagnoses:  Pain  Foot sprain, right, initial encounter    Rx / DC Orders ED Discharge Orders    None       Carroll Sage, PA-C 10/03/19 1340    Jacalyn Lefevre, MD 10/03/19 1438

## 2019-10-03 NOTE — Discharge Instructions (Addendum)
You have been seen here for right foot pain.  Imaging and exam look reassuring.  I recommend taking over-the-counter pain medications like ibuprofen and Tylenol as needed every 6 hours for pain.  Please dosaging on the back of bottle.  I also recommend applying ice to the area as this can help decrease inflammation and swelling as well as doing exercises to help stretch out the area.  Please abstain from activities like running, jumping, jogging as this can increase pain.  I have given you information for community health and wellness they were the individual with little to no insurance help you find a primary care provider.  Also given you information for an orthopedic doctor which you may see for further evaluation if pain does not subside.  I want to come back to emergency department if you develop pins and needle sensation in your foot, your foot turns different color, you develop chest pain, shortness of breath, uncontrolled nausea, vomiting, diarrhea as these symptoms require further evaluation management.

## 2021-06-12 ENCOUNTER — Emergency Department (HOSPITAL_BASED_OUTPATIENT_CLINIC_OR_DEPARTMENT_OTHER): Payer: Self-pay

## 2021-06-12 ENCOUNTER — Other Ambulatory Visit: Payer: Self-pay

## 2021-06-12 ENCOUNTER — Encounter (HOSPITAL_BASED_OUTPATIENT_CLINIC_OR_DEPARTMENT_OTHER): Payer: Self-pay

## 2021-06-12 ENCOUNTER — Emergency Department (HOSPITAL_BASED_OUTPATIENT_CLINIC_OR_DEPARTMENT_OTHER)
Admission: EM | Admit: 2021-06-12 | Discharge: 2021-06-12 | Disposition: A | Discharge status: Home / Self Care | Payer: Self-pay | Attending: Emergency Medicine | Admitting: Emergency Medicine

## 2021-06-12 DIAGNOSIS — K760 Fatty (change of) liver, not elsewhere classified: Secondary | ICD-10-CM | POA: Insufficient documentation

## 2021-06-12 DIAGNOSIS — R197 Diarrhea, unspecified: Secondary | ICD-10-CM | POA: Insufficient documentation

## 2021-06-12 DIAGNOSIS — J189 Pneumonia, unspecified organism: Secondary | ICD-10-CM | POA: Insufficient documentation

## 2021-06-12 DIAGNOSIS — Z20822 Contact with and (suspected) exposure to covid-19: Secondary | ICD-10-CM | POA: Insufficient documentation

## 2021-06-12 LAB — CBC WITH DIFFERENTIAL/PLATELET
Abs Immature Granulocytes: 0.03 10*3/uL (ref 0.00–0.07)
Basophils Absolute: 0 10*3/uL (ref 0.0–0.1)
Basophils Relative: 0 %
Eosinophils Absolute: 0 10*3/uL (ref 0.0–0.5)
Eosinophils Relative: 0 %
HCT: 37.7 % (ref 36.0–46.0)
Hemoglobin: 12.6 g/dL (ref 12.0–15.0)
Immature Granulocytes: 0 %
Lymphocytes Relative: 8 %
Lymphs Abs: 0.6 10*3/uL — ABNORMAL LOW (ref 0.7–4.0)
MCH: 28.4 pg (ref 26.0–34.0)
MCHC: 33.4 g/dL (ref 30.0–36.0)
MCV: 84.9 fL (ref 80.0–100.0)
Monocytes Absolute: 0.6 10*3/uL (ref 0.1–1.0)
Monocytes Relative: 8 %
Neutro Abs: 6.2 10*3/uL (ref 1.7–7.7)
Neutrophils Relative %: 84 %
Platelets: 218 10*3/uL (ref 150–400)
RBC: 4.44 MIL/uL (ref 3.87–5.11)
RDW: 13.4 % (ref 11.5–15.5)
WBC: 7.5 10*3/uL (ref 4.0–10.5)
nRBC: 0 % (ref 0.0–0.2)

## 2021-06-12 LAB — URINALYSIS, ROUTINE W REFLEX MICROSCOPIC
Bilirubin Urine: NEGATIVE
Glucose, UA: NEGATIVE mg/dL
Hgb urine dipstick: NEGATIVE
Ketones, ur: NEGATIVE mg/dL
Leukocytes,Ua: NEGATIVE
Nitrite: NEGATIVE
Protein, ur: NEGATIVE mg/dL
Specific Gravity, Urine: 1.008 (ref 1.005–1.030)
pH: 6 (ref 5.0–8.0)

## 2021-06-12 LAB — COMPREHENSIVE METABOLIC PANEL
ALT: 267 U/L — ABNORMAL HIGH (ref 0–44)
AST: 337 U/L — ABNORMAL HIGH (ref 15–41)
Albumin: 4.3 g/dL (ref 3.5–5.0)
Alkaline Phosphatase: 84 U/L (ref 38–126)
Anion gap: 11 (ref 5–15)
BUN: 9 mg/dL (ref 6–20)
CO2: 25 mmol/L (ref 22–32)
Calcium: 10 mg/dL (ref 8.9–10.3)
Chloride: 99 mmol/L (ref 98–111)
Creatinine, Ser: 0.68 mg/dL (ref 0.44–1.00)
GFR, Estimated: >60 mL/min (ref >60)
Glucose, Bld: 117 mg/dL — ABNORMAL HIGH (ref 70–99)
Potassium: 3.5 mmol/L (ref 3.5–5.1)
Sodium: 135 mmol/L (ref 135–145)
Total Bilirubin: 1.7 mg/dL — ABNORMAL HIGH (ref 0.3–1.2)
Total Protein: 7.3 g/dL (ref 6.5–8.1)

## 2021-06-12 LAB — RESP PANEL BY RT-PCR (FLU A&B, COVID) ARPGX2
Influenza A by PCR: NEGATIVE
Influenza B by PCR: NEGATIVE
SARS Coronavirus 2 by RT PCR: NEGATIVE

## 2021-06-12 LAB — LIPASE, BLOOD: Lipase: <10 U/L — ABNORMAL LOW (ref 11–51)

## 2021-06-12 LAB — BILIRUBIN, DIRECT: Bilirubin, Direct: 0.6 mg/dL — ABNORMAL HIGH (ref 0.0–0.2)

## 2021-06-12 LAB — PREGNANCY, URINE: Preg Test, Ur: NEGATIVE

## 2021-06-12 MED ORDER — LACTATED RINGERS IV BOLUS
1000.0000 mL | Freq: Once | INTRAVENOUS | Status: AC
  Administered 2021-06-12: 1000 mL via INTRAVENOUS

## 2021-06-12 MED ORDER — ACETAMINOPHEN 500 MG PO TABS
1000.0000 mg | ORAL_TABLET | Freq: Once | ORAL | Status: AC
  Administered 2021-06-12: 1000 mg via ORAL
  Filled 2021-06-12: qty 2

## 2021-06-12 MED ORDER — ONDANSETRON HCL 4 MG/2ML IJ SOLN
4.0000 mg | Freq: Once | INTRAMUSCULAR | Status: AC
  Administered 2021-06-12: 4 mg via INTRAVENOUS
  Filled 2021-06-12: qty 2

## 2021-06-12 MED ORDER — AZITHROMYCIN 250 MG PO TABS: 250.0000 mg | ORAL_TABLET | Freq: Every day | ORAL | 0 refills | Status: DC

## 2021-06-12 NOTE — ED Triage Notes (Signed)
She c/o fatigue, anorexia and vomited x 2 yesterday (none today). She is ambulatory and in no distress. Her daughter is with her.  ?

## 2021-06-12 NOTE — Discharge Instructions (Addendum)
You were evaluated in the Emergency Department and after careful evaluation, we did not find any emergent condition requiring admission or further testing in the hospital. ? ?Your exam/testing today was overall reassuring.  Your COVID and influenza testing was negative.  You do not have an elevated white blood cell count.  The remainder of your labs was reassuring.  Your chest x-ray showed a possible atypical pneumonia for which we will treat with azithromycin.  This also could just be a viral etiology.  Follow-up with your PCP or this emergency department if symptoms do not improve following treatment. ? ?Please return to the Emergency Department if you experience any worsening of your condition.  Thank you for allowing [place] to be a part of your care. ? ?

## 2021-06-12 NOTE — ED Notes (Signed)
Discharge instructions, follow up care, and prescriptions reviewed and explained, pt verbalized understanding.  

## 2021-06-12 NOTE — ED Provider Notes (Signed)
?[facility] EMERGENCY DEPT ?Provider Note ? ? ?CSN: [id number] ?Arrival date & time: 06/12/21  4431 ? ?  ? ?History ? ?Chief Complaint  ?Patient presents with  ? Fever  ? ? ?Bonnie Mcneil is a 41 y.o. female. ? ? ?Fever ?Associated symptoms: cough, nausea and vomiting   ?Associated symptoms: no diarrhea and no dysuria   ? ? 41 year old female with hx of cholecystitis in 2008 s/p cholecystectomy who presents to the ED with fever. For the past day, she has had a high fever, tmax 104F. She also endorses nausea, two episodes of NBNB emesis and LLQ abdominal pain. She has had no episodes of vomiting today. Her urine is dark. No pain or burning while urinating. She has had two weeks of mildly productive cough, productive of clear sputum. She checked her BP at home and it was soft, in the 90s systolic, and presented to the ED. She endorses myalgias and a headache. HA located in a bandlike pattern across her forehead. No neck rigidity.  ? ?Home Medications ?Prior to Admission medications   ?Medication Sig Start Date End Date Taking? Authorizing Provider  ?azithromycin (ZITHROMAX) 250 MG tablet Take 1 tablet (250 mg total) by mouth daily. Take first 2 tablets together, then 1 every day until finished. 06/12/21  Yes Ernie Avena, MD  ?   ? ?Allergies    ?Patient has no known allergies.   ? ?Review of Systems   ?Review of Systems  ?Constitutional:  Positive for fever.  ?Respiratory:  Positive for cough.   ?Gastrointestinal:  Positive for abdominal pain, nausea and vomiting. Negative for diarrhea.  ?Genitourinary:  Negative for dysuria.  ?All other systems reviewed and are negative. ? ?Physical Exam ?Updated Vital Signs ?BP 103/68 (BP Location: Left Arm)   Pulse 82   Temp 98.9 ?F (37.2 ?C) (Oral)   Resp 12   LMP 05/29/2021 (Approximate)   SpO2 97%  ?Physical Exam ?Vitals and nursing note reviewed.  ?Constitutional:   ?   General: She is not in acute distress. ?   Appearance: She is well-developed.  ?HENT:   ?   Head: Normocephalic and atraumatic.  ?Eyes:  ?   Conjunctiva/sclera: Conjunctivae normal.  ?Cardiovascular:  ?   Rate and Rhythm: Normal rate and regular rhythm.  ?   Heart sounds: No murmur heard. ?Pulmonary:  ?   Effort: Pulmonary effort is normal. No respiratory distress.  ?   Breath sounds: Normal breath sounds.  ?Abdominal:  ?   Palpations: Abdomen is soft.  ?   Tenderness: There is no abdominal tenderness.  ?Musculoskeletal:     ?   General: No swelling.  ?   Cervical back: Neck supple.  ?Skin: ?   General: Skin is warm and dry.  ?   Capillary Refill: Capillary refill takes less than 2 seconds.  ?Neurological:  ?   Mental Status: She is alert.  ?Psychiatric:     ?   Mood and Affect: Mood normal.  ? ? ?ED Results / Procedures / Treatments   ?Labs ?(all labs ordered are listed, but only abnormal results are displayed) ?Labs Reviewed  ?CBC WITH DIFFERENTIAL/PLATELET - Abnormal; Notable for the following components:  ?    Result Value  ? Lymphs Abs 0.6 (*)   ? All other components within normal limits  ?COMPREHENSIVE METABOLIC PANEL - Abnormal; Notable for the following components:  ? Glucose, Bld 117 (*)   ? AST 337 (*)   ? ALT 267 (*)   ?  Total Bilirubin 1.7 (*)   ? All other components within normal limits  ?LIPASE, BLOOD - Abnormal; Notable for the following components:  ? Lipase <10 (*)   ? All other components within normal limits  ?BILIRUBIN, DIRECT - Abnormal; Notable for the following components:  ? Bilirubin, Direct 0.6 (*)   ? All other components within normal limits  ?RESP PANEL BY RT-PCR (FLU A&B, COVID) ARPGX2  ?URINALYSIS, ROUTINE W REFLEX MICROSCOPIC  ?PREGNANCY, URINE  ? ? ?EKG ?None ? ?Radiology ?DG Chest Portable 1 View ? ?Result Date: 06/12/2021 ?CLINICAL DATA:  Cough, fever EXAM: PORTABLE CHEST 1 VIEW COMPARISON:  None. FINDINGS: The heart size and mediastinal contours are within normal limits. Mildly increased perihilar and bibasilar interstitial markings. No lobar consolidation. No  pleural effusion or pneumothorax. The visualized skeletal structures are unremarkable. IMPRESSION: Mildly increased perihilar and bibasilar interstitial markings, findings may reflect atypical/viral infection. Electronically Signed   By: Duanne GuessNicholas  Plundo D.O.   On: 06/12/2021 10:06  ? ?US Abdomen Limited RUQ (LIVER/GB) ? ?Result Date: 06/12/2021 ?CLINICAL DATA:  Generalized abdominal pain, nausea, and vomiting for 2 days, history of cholecystectomy EXAM: ULTRASOUND ABDOMEN LIMITED RIGHT UPPER QUADRANT COMPARISON:  04/08/2006 FINDINGS: Gallbladder: Surgically absent Common bile duct: Diameter: 3 mm, normal Liver: Heterogeneous increased echogenicity likely representing fatty infiltration of this can be seen with cirrhosis and some infiltrative disorders. No hepatic mass or nodularity identified. No intrahepatic biliary dilatation. Portal vein is patent on color Doppler imaging with normal direction of blood flow towards the liver. Other: No RIGHT upper quadrant free fluid. IMPRESSION: Probable fatty infiltration of liver as above. Post cholecystectomy. No acute abnormalities. Electronically Signed   By: Ulyses SouthwardMark  Boles M.D.   On: 06/12/2021 12:08   ? ?Procedures ?Procedures  ? ? ?Medications Ordered in ED ?Medications  ?acetaminophen (TYLENOL) tablet 1,000 mg (1,000 mg Oral Given 06/12/21 1117)  ?lactated ringers bolus 1,000 mL (0 mLs Intravenous Stopped 06/12/21 1222)  ?ondansetron (ZOFRAN) injection 4 mg (4 mg Intravenous Given 06/12/21 1115)  ? ? ?ED Course/ Medical Decision Making/ A&P ?Clinical Course as of 06/12/21 1702  ?Sun Jun 12, 2021  ?1112 AST(!): 337 [JL]  ?1112 ALT(!): 267 [JL]  ?1112 Total Bilirubin(!): 1.7 [JL]  ?1123 Temp(!): 101.7 ?F (38.7 ?C) [JL]  ?  ?Clinical Course User Index ?[JL] Ernie AvenaLawsing, Donyelle Enyeart, MD  ? ?                        ?Medical Decision Making ?Amount and/or Complexity of Data Reviewed ?Labs: ordered. Decision-making details documented in ED Course. ?Radiology: ordered. ? ?Risk ?OTC  drugs. ?Prescription drug management. ? ? ? ? 41 year old female with hx of cholecystitis in 2008 s/p cholecystectomy who presents to the ED with fever. For the past day, she has had a high fever, tmax 104F. She also endorses nausea, two episodes of NBNB emesis and LLQ abdominal pain. She has had no episodes of vomiting today. Her urine is dark. No pain or burning while urinating. She has had two weeks of mildly productive cough, productive of clear sputum. She checked her BP at home and it was soft, in the 90s systolic, and presented to the ED. She endorses myalgias and a headache. HA located in a bandlike pattern across her forehead. No neck rigidity.  ? ?On my exam, the patient is well-appearing and well-hydrated.  The patient's lungs are clear to auscultation bilaterally. Additionally, the patient has a soft/non-tender abdomen, clear tympanic membranes, and  no oropharyngeal exudates.  There are no signs of meningismus.  I see no signs of an acute bacterial infection although given 2 weeks of cough will obtain a CXR. ? ?The patient's presentation is most consistent with a viral upper respiratory infection and bronchitis vs bacterial PNA.   ? ?Work-up significant for chest x-ray that was reviewed by myself and radiology, concerning for an atypical pneumonia: ?  ?IMPRESSION:  ?Mildly increased perihilar and bibasilar interstitial markings,  ?findings may reflect atypical/viral infection.  ? ? ?Urinalysis negative for UTI, urine pregnancy negative, CBC without a leukocytosis or anemia, CMP without electrolyte abnormality, elevation in liver enzymes AST 337, ALT 267, elevated T. bili 1.7 with a direct bili of 0.6.  COVID-19 and influenza PCR testing was collected and resulted negative.   ? ?Given the elevated T. bili and elevated LFTs, right upper quadrant ultrasound was performed and revealed the following: Fatty liver noted, parsed cholecystectomy, CBD normal at 3 mm.  No acute abnormalities were noted. ? ?On  reassessment, the patient was well-appearing, tolerating oral intake, symptomatically improved following IV fluids, IV Zofran and oral Tylenol. ? ? ?I discussed symptomatic management, including hydration, motrin, and tylenol

## 2021-06-12 NOTE — ED Notes (Signed)
US tech at bedside

## 2022-12-27 ENCOUNTER — Ambulatory Visit (INDEPENDENT_AMBULATORY_CARE_PROVIDER_SITE_OTHER): Payer: Self-pay

## 2022-12-27 ENCOUNTER — Ambulatory Visit (HOSPITAL_COMMUNITY)
Admission: EM | Admit: 2022-12-27 | Discharge: 2022-12-27 | Disposition: A | Discharge status: Home / Self Care | Payer: Self-pay | Attending: Family Medicine | Admitting: Family Medicine

## 2022-12-27 ENCOUNTER — Encounter (HOSPITAL_COMMUNITY): Payer: Self-pay

## 2022-12-27 DIAGNOSIS — R0602 Shortness of breath: Secondary | ICD-10-CM

## 2022-12-27 DIAGNOSIS — M546 Pain in thoracic spine: Secondary | ICD-10-CM

## 2022-12-27 DIAGNOSIS — R051 Acute cough: Secondary | ICD-10-CM

## 2022-12-27 LAB — POCT URINALYSIS DIP (MANUAL ENTRY)
Bilirubin, UA: NEGATIVE
Glucose, UA: NEGATIVE mg/dL
Ketones, POC UA: NEGATIVE mg/dL
Leukocytes, UA: NEGATIVE
Nitrite, UA: NEGATIVE
Protein Ur, POC: NEGATIVE mg/dL
Spec Grav, UA: 1.015 (ref 1.010–1.025)
Urobilinogen, UA: 0.2 U/dL
pH, UA: 5.5 (ref 5.0–8.0)

## 2022-12-27 LAB — POCT URINE PREGNANCY: Preg Test, Ur: NEGATIVE

## 2022-12-27 MED ORDER — DOXYCYCLINE HYCLATE 100 MG PO CAPS: 100.0000 mg | ORAL_CAPSULE | Freq: Two times a day (BID) | ORAL | 0 refills | Status: AC

## 2022-12-27 MED ORDER — HYDROCODONE BIT-HOMATROP MBR 5-1.5 MG/5ML PO SOLN: 5.0000 mL | Freq: Four times a day (QID) | ORAL | 0 refills | Status: AC | PRN

## 2022-12-27 NOTE — ED Triage Notes (Signed)
Patient c/o SOB, a productive cough at times which is yellow/brown, headache, and fever,and chills x 1 week. Patient states her sister took her BP today and is was 94-systolic, but doesn't remember the rest.

## 2022-12-27 NOTE — ED Provider Notes (Signed)
[facility]   [id number] 12/27/22 Arrival Time: 1650  ASSESSMENT & PLAN:  1. SOB (shortness of breath)   2. Acute cough   3. Acute bilateral thoracic back pain    I have personally viewed and independently interpreted the imaging studies ordered this visit. CXR: subtle changes suggesting possible RML PNA.  Meds ordered this encounter  Medications   doxycycline (VIBRAMYCIN) 100 MG capsule    Sig: Take 1 capsule (100 mg total) by mouth 2 (two) times daily.    Dispense:  14 capsule    Refill:  0   HYDROcodone bit-homatropine (HYCODAN) 5-1.5 MG/5ML syrup    Sig: Take 5 mLs by mouth every 6 (six) hours as needed for cough.    Dispense:  90 mL    Refill:  0   Without resp distress.  Results for orders placed or performed during the hospital encounter of 12/27/22  POC urinalysis dipstick  Result Value Ref Range   Color, UA yellow yellow   Clarity, UA clear clear   Glucose, UA negative negative mg/dL   Bilirubin, UA negative negative   Ketones, POC UA negative negative mg/dL   Spec Grav, UA 4.098 1.191 - 1.025   Blood, UA trace-intact (A) negative   pH, UA 5.5 5.0 - 8.0   Protein Ur, POC negative negative mg/dL   Urobilinogen, UA 0.2 0.2 or 1.0 E.U./dL   Nitrite, UA Negative Negative   Leukocytes, UA Negative Negative  POCT urine pregnancy  Result Value Ref Range   Preg Test, Ur Negative Negative    Cough medication sedation precautions.  Follow-up Information     [facility]   Specialty: Urgent Care Why: If worsening or failing to improve as anticipated. Contact information: [address] [place] [address] [phone]                 Reviewed expectations re: course of current medical issues. Questions answered. Outlined signs and symptoms indicating need for more acute intervention. Patient verbalized understanding. After Visit Summary given.   SUBJECTIVE: History from:  patient.  Bonnie Mcneil is a 42 y.o. female who presents with complaint of cold symptoms, productive cough; x 1 week, questions longer. Now with subj fever/chills and feeling of SOB. Denies CP. Reports dull thoracic back pain. Reports her sister took BP today and was 94 systolic. Was feeling the same at the time. Denies n/v. Normal PO intake. Normal ambulation. Denies LE edema. No tx PTA.   Social History   Tobacco Use  Smoking Status Never  Smokeless Tobacco Never    OBJECTIVE:  Vitals:   12/27/22 1844  BP: 110/65  Pulse: 99  Resp: 18  Temp: 98.7 F (37.1 C)  TempSrc: Oral  SpO2: 93%     General appearance: alert; appears fatigued; NAD HEENT: nasal congestion Neck: supple without LAD CV: RRR Lungs: unlabored respirations, symmetrical air entry without wheezing; cough: mild; moving air well bilaterally Back: no specific TTP over thoracic back Abd: soft Ext: no LE edema Skin: warm and dry Psychological: alert and cooperative; normal mood and affect    No Known Allergies  Past Medical History:  Diagnosis Date   Chlamydia 2004   Cholecystitis 2008   Intrauterine pregnancy 2004, 2008   Obesity 06/11/2016   Therapeutic abortion in first trimester 2008   secondary to concern for baby during cholecystecomy   Family History  Problem Relation Age of Onset   Hypotension Mother    Cancer  Maternal Grandmother    Cancer Paternal Grandmother    Social History   Socioeconomic History   Marital status: Divorced    Spouse name: Not on file   Number of children: Not on file   Years of education: Not on file   Highest education level: Not on file  Occupational History   Occupation: unemployed  Tobacco Use   Smoking status: Never   Smokeless tobacco: Never  Vaping Use   Vaping status: Never Used  Substance and Sexual Activity   Alcohol use: No   Drug use: No   Sexual activity: Not Currently    Birth control/protection: None  Other Topics Concern   Not on  file  Social History Narrative   Patient lives with 2 children in [place]    Social Determinants of Health   Financial Resource Strain: Low Risk  (12/05/2017)   Overall Financial Resource Strain (CARDIA)    Difficulty of Paying Living Expenses: Not hard at all  Food Insecurity: No Food Insecurity (12/05/2017)   Hunger Vital Sign    Worried About Running Out of Food in the Last Year: Never true    Ran Out of Food in the Last Year: Never true  Transportation Needs: Unknown (12/05/2017)   PRAPARE - Administrator, Civil Service (Medical): No    Lack of Transportation (Non-Medical): Not on file  Physical Activity: Not on file  Stress: No Stress Concern Present (12/05/2017)   [employer] of Occupational Health - Occupational Stress Questionnaire    Feeling of Stress : Only a little  Social Connections: Not on file  Intimate Partner Violence: Not At Risk (12/05/2017)   Humiliation, Afraid, Rape, and Kick questionnaire    Fear of Current or Ex-Partner: No    Emotionally Abused: No    Physically Abused: No    Sexually Abused: No            Mardella Layman, MD 12/27/22 1935

## 2022-12-27 NOTE — Discharge Instructions (Signed)
Be aware, your cough medication may cause drowsiness. Please do not drive, operate heavy machinery or make important decisions while on this medication, it can cloud your judgement.  

## 2024-01-27 IMAGING — US US ABDOMEN LIMITED
1 series · 14 of 25 positions shown · non-contrast
Comparison: 04/08/2006

CLINICAL DATA: Generalized abdominal pain, nausea, and vomiting for
2 days, history of cholecystectomy

EXAM:
ULTRASOUND ABDOMEN LIMITED RIGHT UPPER QUADRANT

[Series 1: us abdomen limited ruq (liver/gb) · 14 of 31 slices shown]
[im 1/31]
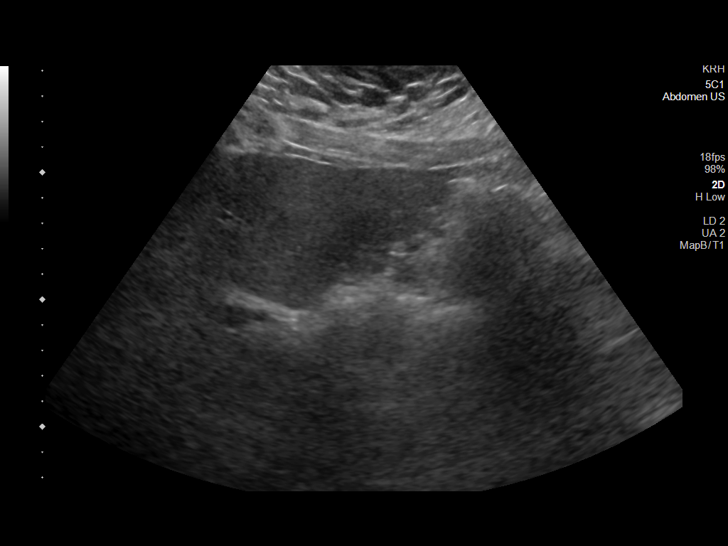
[im 3/31]
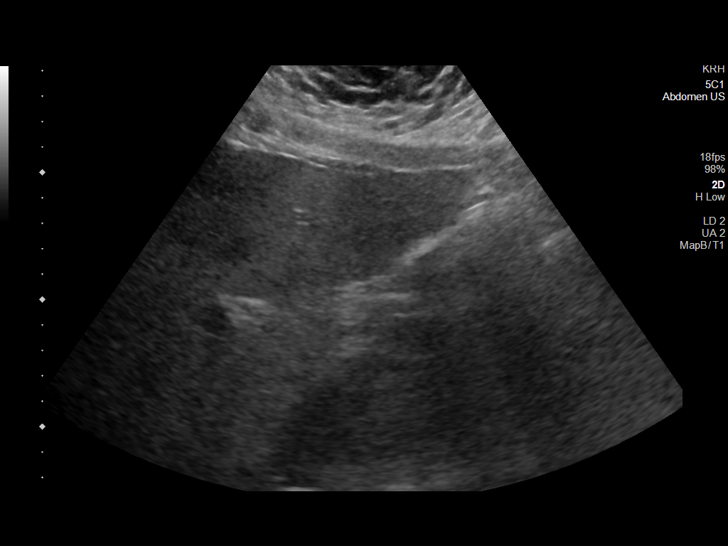
[im 6/31]
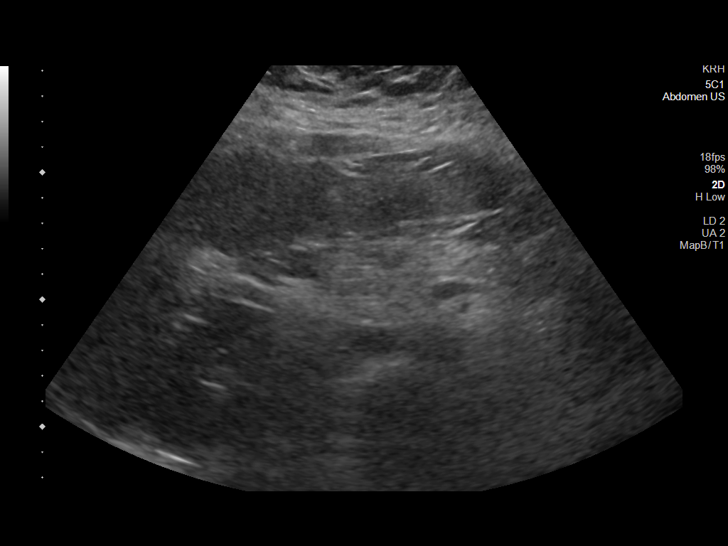
[im 8/31]
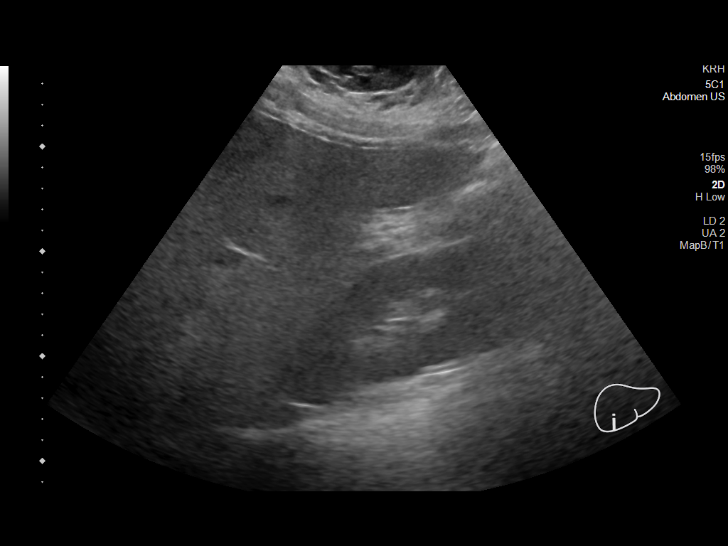
[im 11/31]
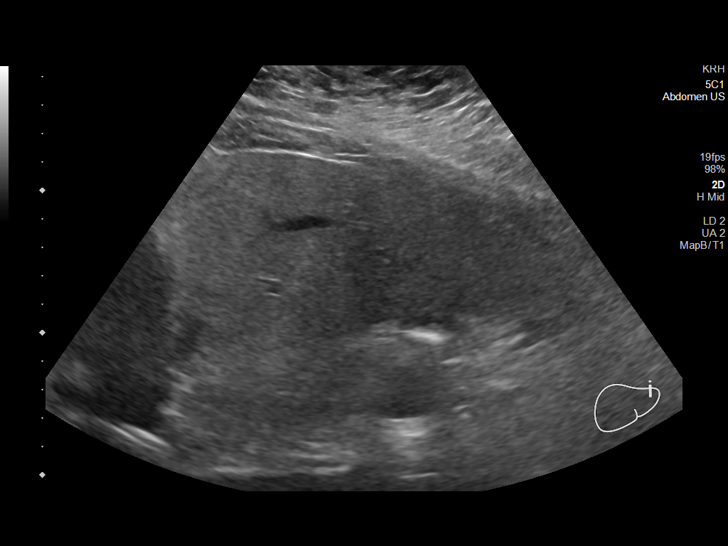
[im 12/31]
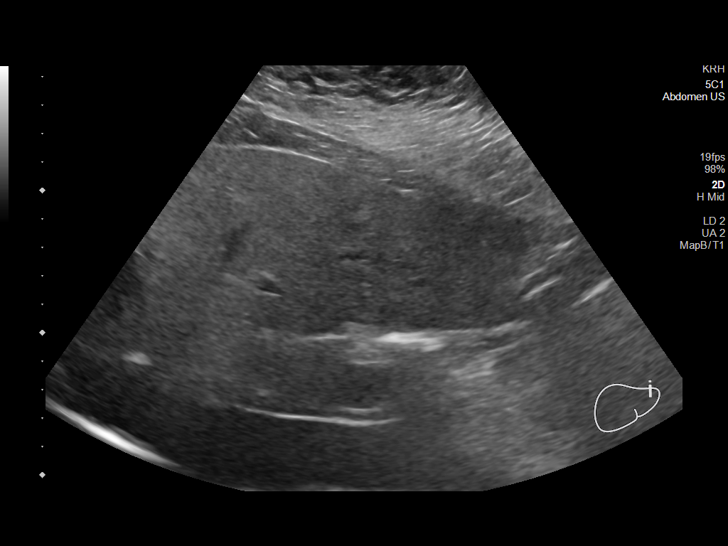
[im 14/31]
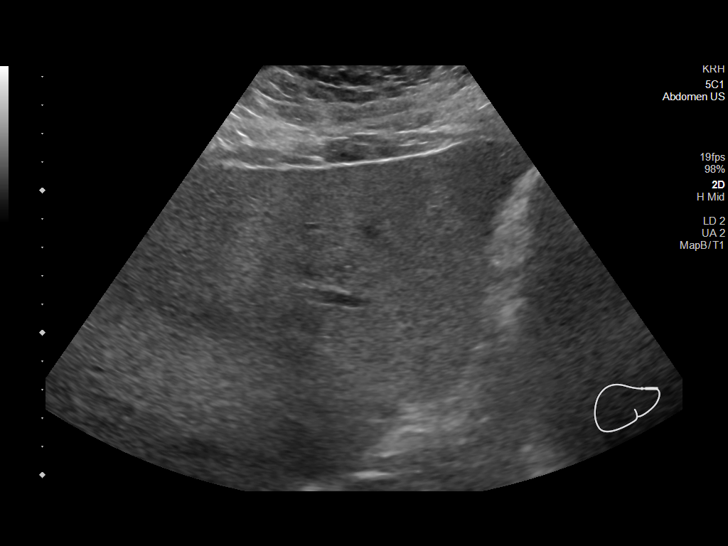
[im 17/31]
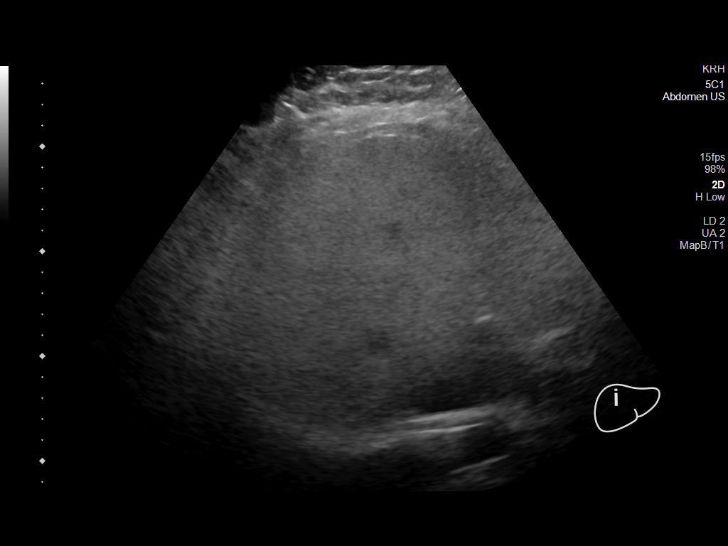
[im 19/31]
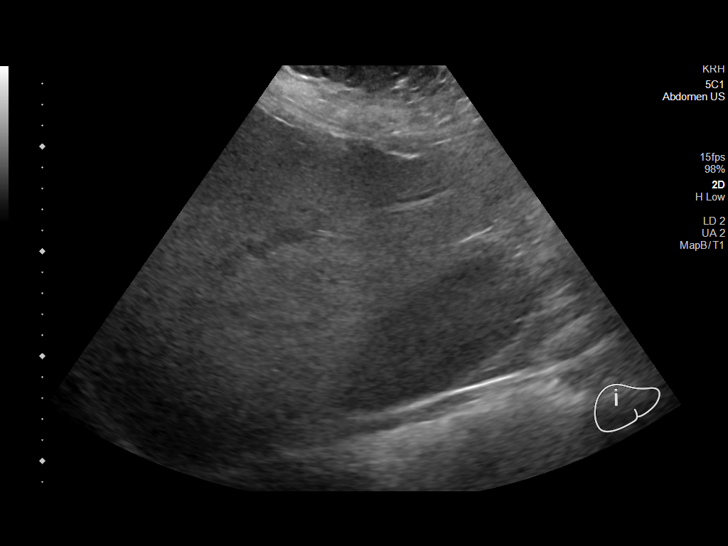
[im 21/31]
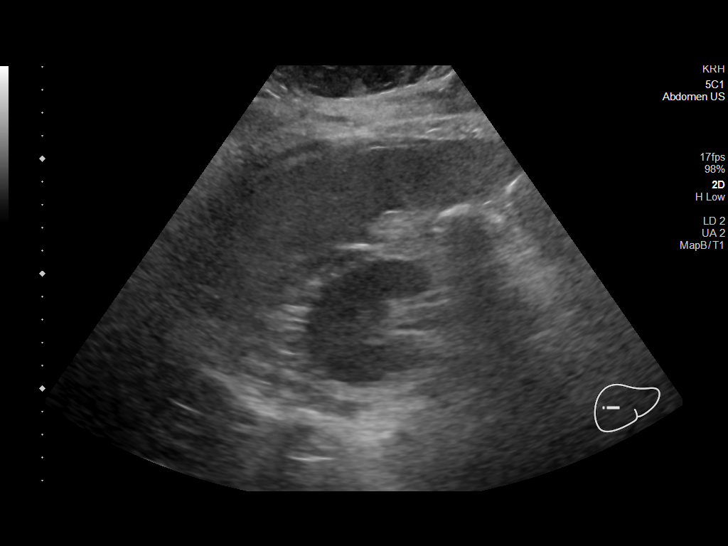
[im 23/31]
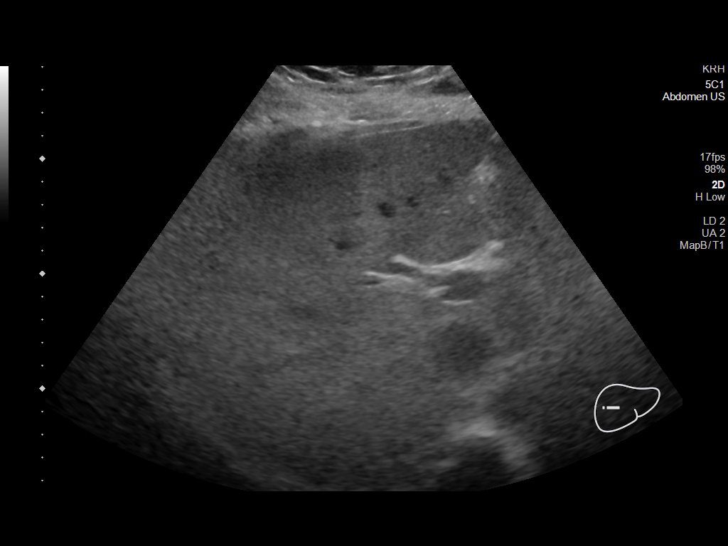
[im 26/31]
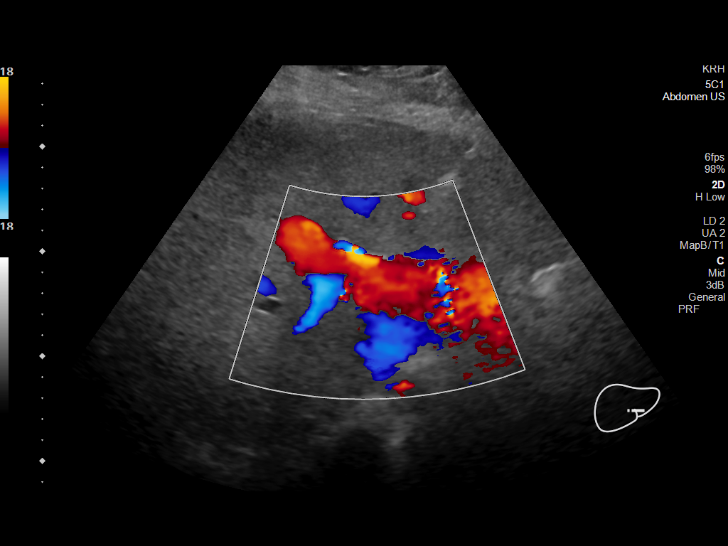
[im 28/31]
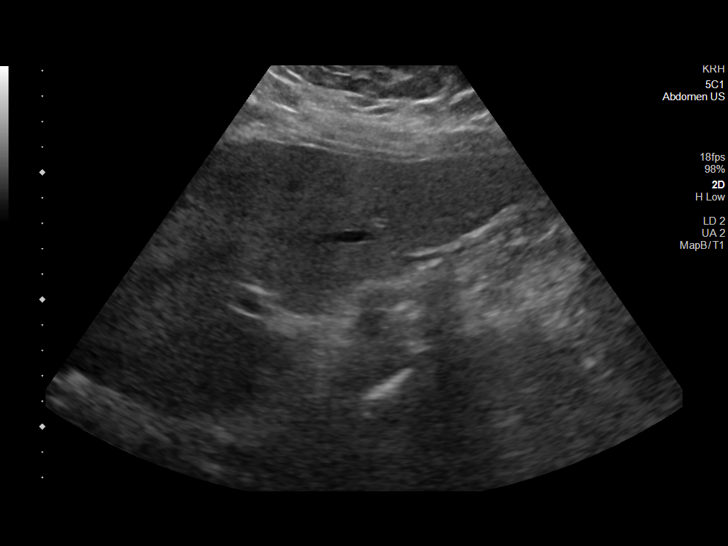
[im 31/31]
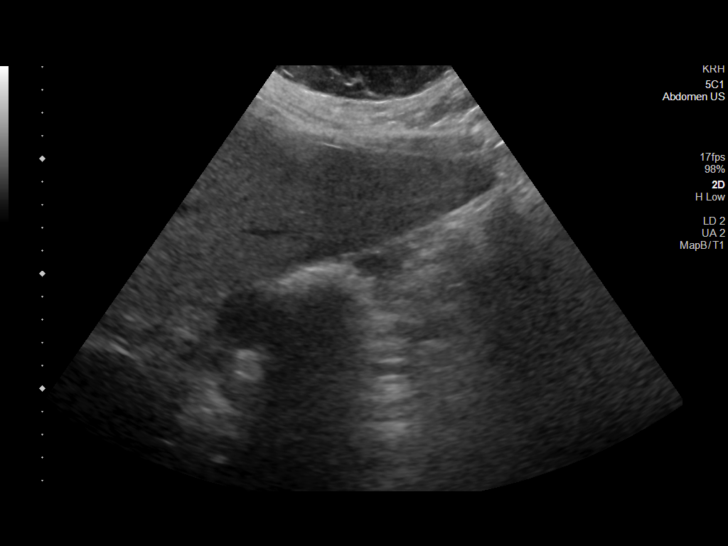

[14 of 25 positions shown; findings below may reference images not displayed]

FINDINGS: Gallbladder:

Surgically absent

Common bile duct:

Diameter: 3 mm, normal

Liver:

Heterogeneous increased echogenicity likely representing fatty
infiltration of this can be seen with cirrhosis and some
infiltrative disorders. No hepatic mass or nodularity identified. No
intrahepatic biliary dilatation. Portal vein is patent on color
Doppler imaging with normal direction of blood flow towards the
liver.

Other: No RIGHT upper quadrant free fluid.
IMPRESSION: Probable fatty infiltration of liver as above.

Post cholecystectomy.

No acute abnormalities.
